# Patient Record
Sex: Female | Born: 1985
Health system: Southern US, Community
[De-identification: ages and names within clinical notes are randomized; demographics above are authoritative.]

## PROBLEM LIST (undated history)

## (undated) DIAGNOSIS — I824Y9 Acute embolism and thrombosis of unspecified deep veins of unspecified proximal lower extremity: Secondary | ICD-10-CM

## (undated) DIAGNOSIS — IMO0002 Reserved for concepts with insufficient information to code with codable children: Secondary | ICD-10-CM

## (undated) DIAGNOSIS — K219 Gastro-esophageal reflux disease without esophagitis: Secondary | ICD-10-CM

## (undated) HISTORY — DX: Gastro-esophageal reflux disease without esophagitis: K21.9

## (undated) HISTORY — DX: Reserved for concepts with insufficient information to code with codable children: IMO0002

## (undated) HISTORY — PX: OTHER SURGICAL HISTORY: SHX169

---

## 2006-04-15 ENCOUNTER — Emergency Department (HOSPITAL_COMMUNITY): Admission: EM | Admit: 2006-04-15 | Discharge: 2006-04-15 | Payer: Self-pay | Admitting: Emergency Medicine

## 2006-11-18 ENCOUNTER — Emergency Department (HOSPITAL_COMMUNITY): Admission: EM | Admit: 2006-11-18 | Discharge: 2006-11-18 | Payer: Self-pay | Admitting: Emergency Medicine

## 2007-07-12 ENCOUNTER — Emergency Department (HOSPITAL_COMMUNITY): Admission: EM | Admit: 2007-07-12 | Discharge: 2007-07-12 | Payer: Self-pay | Admitting: Emergency Medicine

## 2008-11-04 ENCOUNTER — Emergency Department (HOSPITAL_COMMUNITY): Admission: EM | Admit: 2008-11-04 | Discharge: 2008-11-04 | Payer: Self-pay | Admitting: Emergency Medicine

## 2009-05-03 ENCOUNTER — Emergency Department (HOSPITAL_COMMUNITY): Admission: EM | Admit: 2009-05-03 | Discharge: 2009-05-03 | Payer: Self-pay | Admitting: Emergency Medicine

## 2009-05-08 ENCOUNTER — Emergency Department (HOSPITAL_COMMUNITY): Admission: EM | Admit: 2009-05-08 | Discharge: 2009-05-08 | Payer: Self-pay | Admitting: Emergency Medicine

## 2009-06-19 ENCOUNTER — Inpatient Hospital Stay (HOSPITAL_COMMUNITY): Admission: AD | Admit: 2009-06-19 | Discharge: 2009-06-19 | Payer: Self-pay | Admitting: Obstetrics & Gynecology

## 2009-07-18 ENCOUNTER — Ambulatory Visit: Payer: Self-pay | Admitting: Obstetrics & Gynecology

## 2009-07-18 ENCOUNTER — Encounter: Payer: Self-pay | Admitting: Obstetrics & Gynecology

## 2009-08-22 ENCOUNTER — Emergency Department (HOSPITAL_COMMUNITY): Admission: EM | Admit: 2009-08-22 | Discharge: 2009-08-22 | Payer: Self-pay | Admitting: Emergency Medicine

## 2009-09-17 ENCOUNTER — Ambulatory Visit: Payer: Self-pay | Admitting: Obstetrics & Gynecology

## 2009-09-17 ENCOUNTER — Ambulatory Visit (HOSPITAL_COMMUNITY): Admission: RE | Admit: 2009-09-17 | Discharge: 2009-09-17 | Payer: Self-pay | Admitting: Obstetrics & Gynecology

## 2009-09-17 ENCOUNTER — Encounter: Payer: Self-pay | Admitting: Obstetrics & Gynecology

## 2009-10-29 ENCOUNTER — Emergency Department (HOSPITAL_COMMUNITY): Admission: EM | Admit: 2009-10-29 | Discharge: 2009-10-29 | Payer: Self-pay | Admitting: Emergency Medicine

## 2009-11-01 ENCOUNTER — Ambulatory Visit: Payer: Self-pay | Admitting: Obstetrics and Gynecology

## 2010-01-17 ENCOUNTER — Ambulatory Visit: Payer: Self-pay | Admitting: Obstetrics & Gynecology

## 2010-03-19 ENCOUNTER — Emergency Department (HOSPITAL_COMMUNITY): Admission: EM | Admit: 2010-03-19 | Discharge: 2010-03-19 | Payer: Self-pay | Admitting: Emergency Medicine

## 2010-04-04 ENCOUNTER — Ambulatory Visit: Payer: Self-pay | Admitting: Obstetrics and Gynecology

## 2010-07-03 ENCOUNTER — Ambulatory Visit: Payer: Self-pay | Admitting: Obstetrics and Gynecology

## 2010-07-09 IMAGING — US US TRANSVAGINAL NON-OB
1 series · 14 of 25 positions shown · non-contrast
Comparison: May 08, 2009

CLINICAL DATA: Pelvic pain; cystic ovarian masses

TRANSVAGINAL ULTRASOUND OF PELVIS
TECHNIQUE: Transvaginal ultrasound examination of the pelvis was
performed including evaluation of the uterus, ovaries, adnexal
regions, and pelvic cul-de-sac.

[Series 1: us transvaginal non-ob · 0.12mm/px · 14 of 35 slices shown]
[im 1/35]
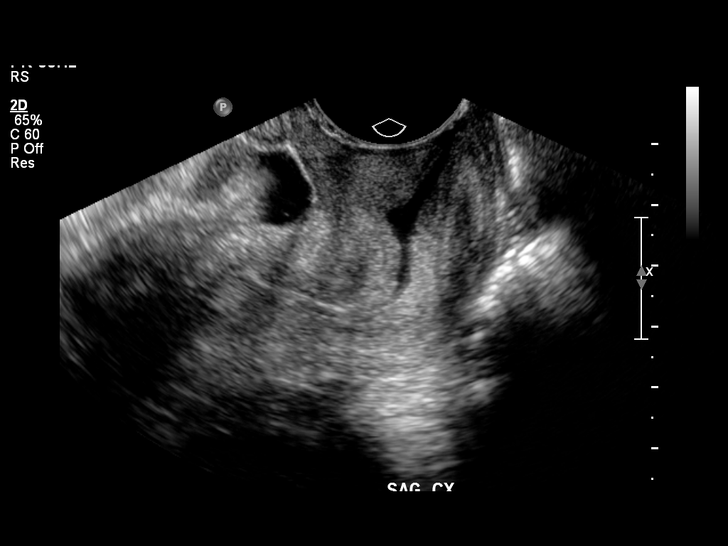
[im 3/35]
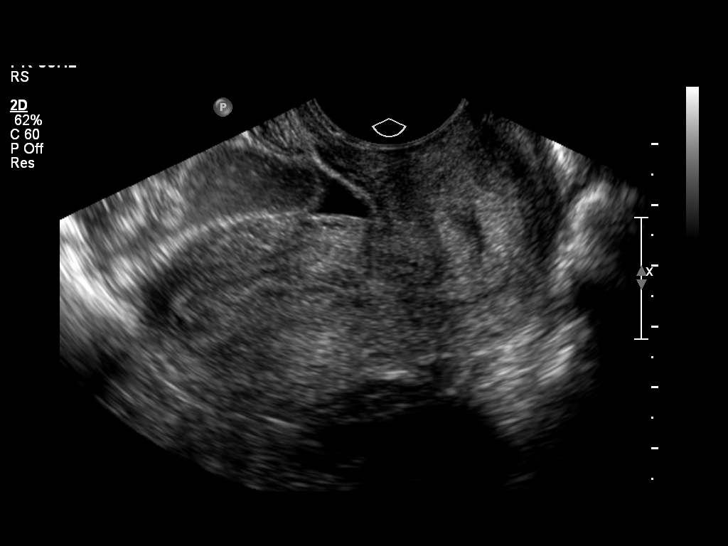
[im 6/35]
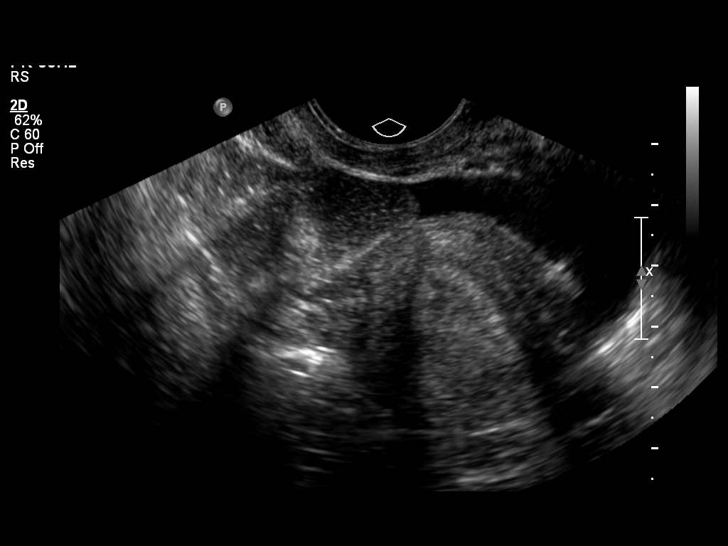
[im 9/35]
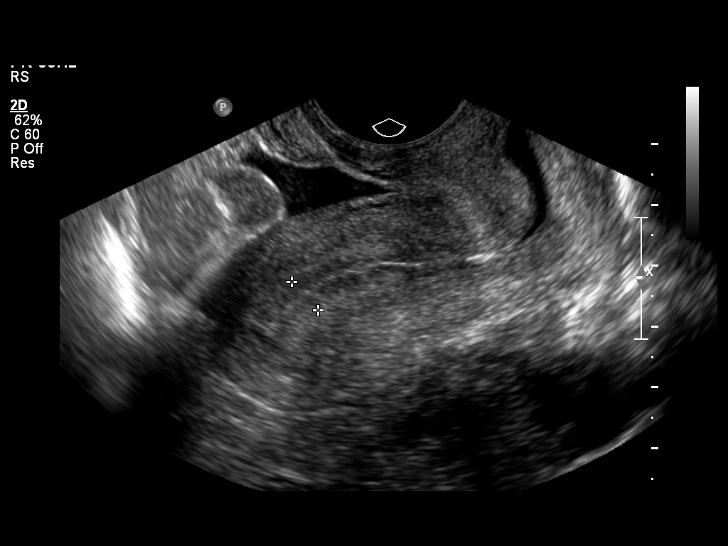
[im 12/35]
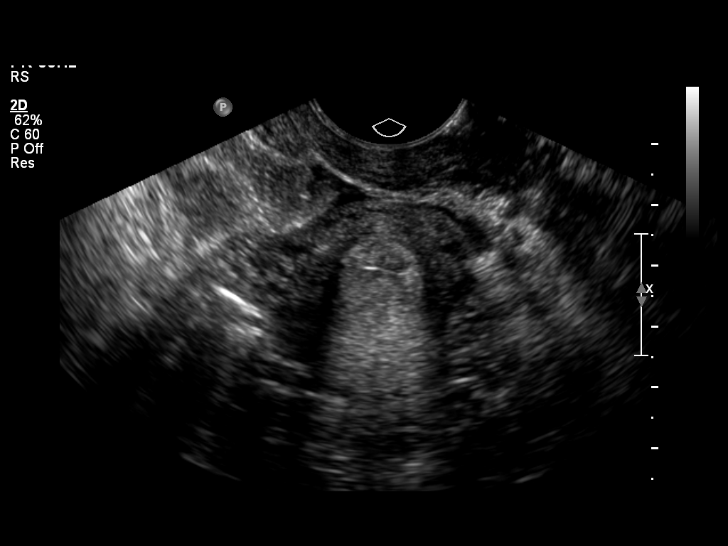
[im 13/35]
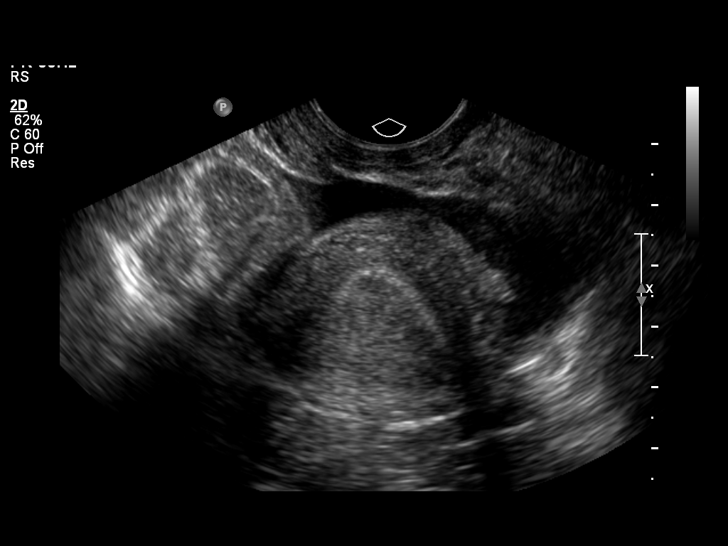
[im 16/35]
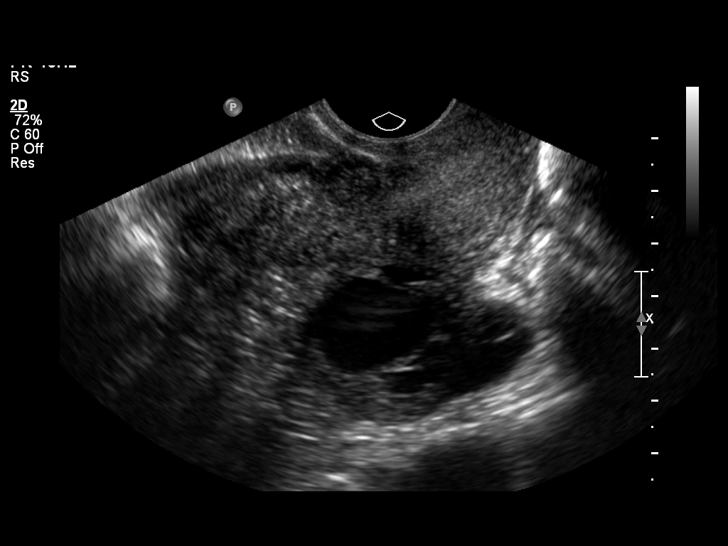
[im 19/35]
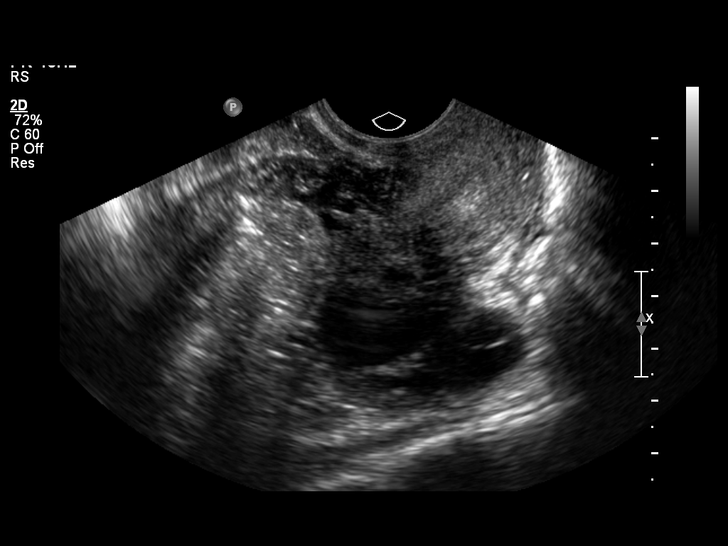
[im 22/35]
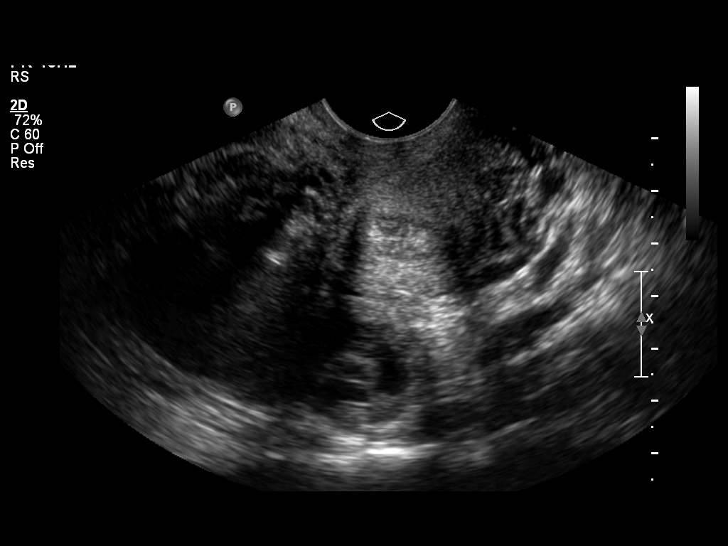
[im 23/35]
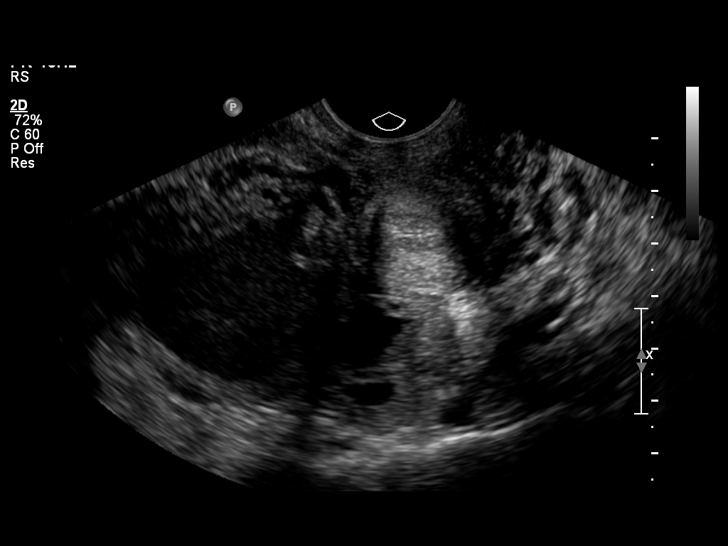
[im 26/35]
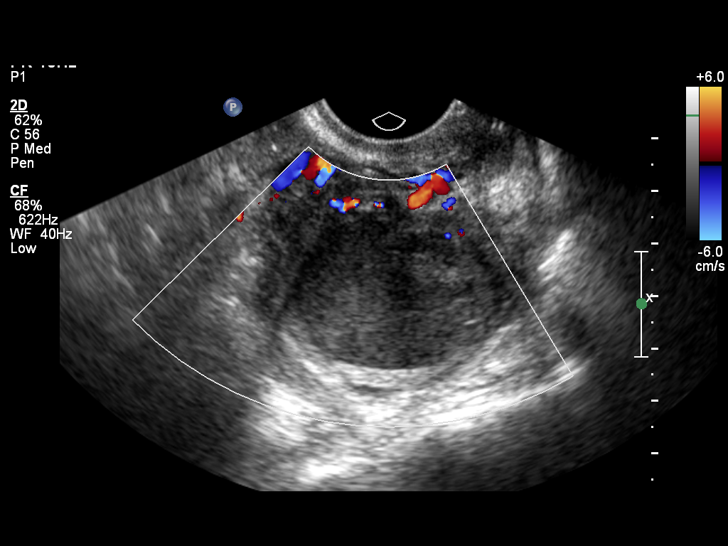
[im 29/35]
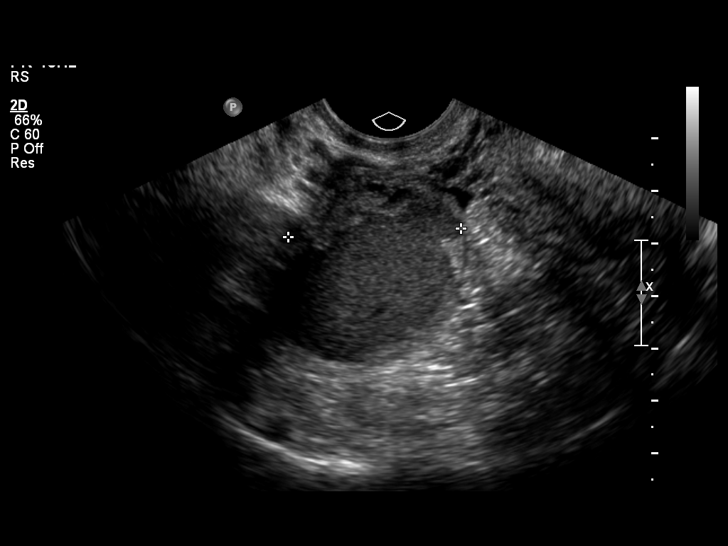
[im 32/35]
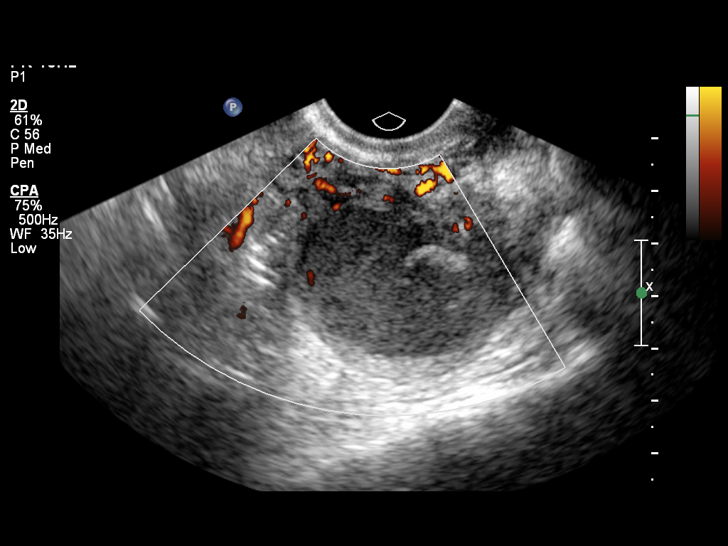
[im 35/35]
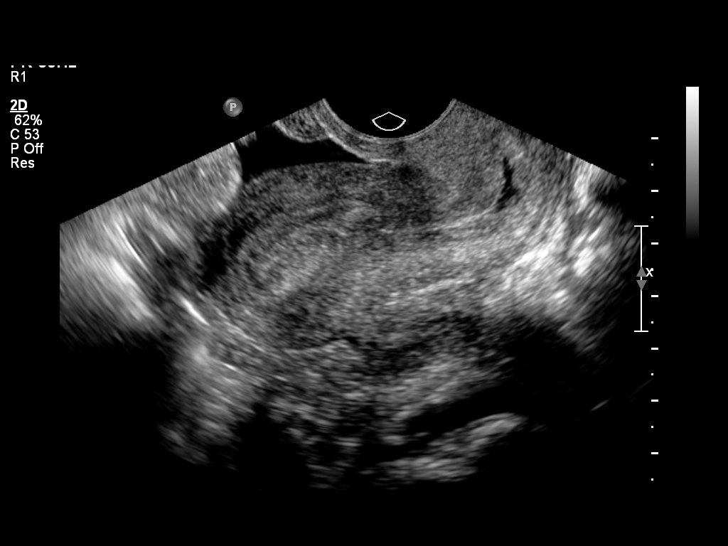

[14 of 25 positions shown; findings below may reference images not displayed]

FINDINGS: The uterus has a normal size and echotexture.
Endometrial stripe is thin and homogeneous, measuring 6 mm in
width.

Today, the left ovary measures 4.3 x 2.5 x 2.2 cm.  The complex
left ovarian cyst has resolved.  The right ovary measures 4.8 x
x 3.3 cm and contains a stable 3.7 cm homogeneous mass hyperechoic
foci, consistent with an endometrioma or a dermoid tumor.  There is
a small amount of free pelvic fluid.
IMPRESSION: Resolution of complex left ovarian cyst.

No change in right ovarian endometrioma versus dermoid tumor.  MRI
may be of help for better characterization if the lesion is not
excised.

## 2010-09-19 ENCOUNTER — Ambulatory Visit: Payer: Self-pay | Admitting: Obstetrics & Gynecology

## 2010-12-05 ENCOUNTER — Ambulatory Visit: Payer: Self-pay | Admitting: Obstetrics & Gynecology

## 2011-02-20 ENCOUNTER — Ambulatory Visit (INDEPENDENT_AMBULATORY_CARE_PROVIDER_SITE_OTHER): Payer: Medicaid Other

## 2011-02-20 DIAGNOSIS — N719 Inflammatory disease of uterus, unspecified: Secondary | ICD-10-CM

## 2011-03-13 ENCOUNTER — Ambulatory Visit (INDEPENDENT_AMBULATORY_CARE_PROVIDER_SITE_OTHER): Payer: Medicaid Other | Admitting: Obstetrics and Gynecology

## 2011-03-13 ENCOUNTER — Other Ambulatory Visit: Payer: Self-pay | Admitting: Obstetrics and Gynecology

## 2011-03-13 DIAGNOSIS — Z124 Encounter for screening for malignant neoplasm of cervix: Secondary | ICD-10-CM

## 2011-03-13 DIAGNOSIS — Z01419 Encounter for gynecological examination (general) (routine) without abnormal findings: Secondary | ICD-10-CM

## 2011-03-21 NOTE — Progress Notes (Unsigned)
Lori Mays, SAHM NO.:  [id number]  MEDICAL RECORD NO.:  [id number]           PATIENT TYPE:  A  LOCATION:  [facility]                   FACILITY:  [facility]  PHYSICIAN:  Argentina Donovan, MD        DATE OF BIRTH:  18-May-1986  DATE OF SERVICE:  03/13/2011                                 CLINIC NOTE  The patient is a 25 year old African American nulligravida female who had a left ovarian complex cyst removed in 2010 by Dr. Marice Potter for stage III endometriosis.  The patient has subsequently been on Depo-Provera for suppression which had worked pretty well for her older sister who is also taking the same drug.  She has been on it now slightly under 2 years, has had no significant problems with it, has been taking calcium with vitamin D supplementation.  On examination, her thyroid is symmetrical, no dominant masses.  The breasts are symmetrical.  No dominant masses.  No nipple discharge.  No supraclavicular or axillary nodes.  The abdomen is soft, flat, nontender.  No masses or organomegaly.  External genitalia is normal. BUN is within normal limits.  Vagina is clean and well rugated.  Cervix is clean and parous.  Pap smear was taken.  Uterus anterior, normal size, shape, consistency and the right ovary could not be palpated. The patient's blood pressure today was 114/70.  She is weighs 122 pounds and 5 feet 7 inches tall.  Has an allergy to BENADRYL which aggravated her asthma.  She takes no other medications on a regular basis.  IMPRESSION:  Normal gynecological examination, stage III endometriosis.          ______________________________ Argentina Donovan, MD    PR/MEDQ  D:  03/13/2011  T:  03/14/2011  Job:  [id number]

## 2011-04-04 LAB — CBC
HCT: 38 % (ref 36.0–46.0)
MCV: 85.4 fL (ref 78.0–100.0)
WBC: 5.1 10*3/uL (ref 4.0–10.5)

## 2011-04-05 LAB — CBC
HCT: 37.2 % (ref 36.0–46.0)
Hemoglobin: 12.2 g/dL (ref 12.0–15.0)
MCHC: 32.8 g/dL (ref 30.0–36.0)
Platelets: 274 10*3/uL (ref 150–400)
RBC: 4.35 MIL/uL (ref 3.87–5.11)

## 2011-04-07 LAB — POCT PREGNANCY, URINE: Preg Test, Ur: NEGATIVE

## 2011-04-07 LAB — CBC
HCT: 35.4 % — ABNORMAL LOW (ref 36.0–46.0)
Platelets: 263 10*3/uL (ref 150–400)
WBC: 3.8 10*3/uL — ABNORMAL LOW (ref 4.0–10.5)

## 2011-04-07 LAB — DIFFERENTIAL
Basophils Relative: 0 % (ref 0–1)
Eosinophils Absolute: 0.1 10*3/uL (ref 0.0–0.7)
Eosinophils Relative: 2 % (ref 0–5)
Lymphocytes Relative: 35 % (ref 12–46)
Neutro Abs: 2 10*3/uL (ref 1.7–7.7)

## 2011-04-07 LAB — GC/CHLAMYDIA PROBE AMP, GENITAL: Chlamydia, DNA Probe: NEGATIVE

## 2011-04-07 LAB — URINALYSIS, ROUTINE W REFLEX MICROSCOPIC
Bilirubin Urine: NEGATIVE
Ketones, ur: NEGATIVE mg/dL
Protein, ur: NEGATIVE mg/dL
Specific Gravity, Urine: 1.01 (ref 1.005–1.030)
Urobilinogen, UA: 1 mg/dL (ref 0.0–1.0)
pH: 6.5 (ref 5.0–8.0)

## 2011-04-08 LAB — URINE MICROSCOPIC-ADD ON

## 2011-04-08 LAB — COMPREHENSIVE METABOLIC PANEL
ALT: 15 U/L (ref 0–35)
AST: 20 U/L (ref 0–37)
Albumin: 4.1 g/dL (ref 3.5–5.2)
Alkaline Phosphatase: 55 U/L (ref 39–117)
Calcium: 9.5 mg/dL (ref 8.4–10.5)
Creatinine, Ser: 0.79 mg/dL (ref 0.4–1.2)
GFR calc Af Amer: >60 mL/min (ref >60)
Potassium: 3.7 mEq/L (ref 3.5–5.1)
Sodium: 140 mEq/L (ref 135–145)
Total Bilirubin: 0.6 mg/dL (ref 0.3–1.2)
Total Protein: 7.1 g/dL (ref 6.0–8.3)

## 2011-04-08 LAB — URINALYSIS, ROUTINE W REFLEX MICROSCOPIC
Bilirubin Urine: NEGATIVE
Nitrite: NEGATIVE
Protein, ur: NEGATIVE mg/dL
Urobilinogen, UA: 1 mg/dL (ref 0.0–1.0)
pH: 6 (ref 5.0–8.0)

## 2011-04-08 LAB — DIFFERENTIAL
Basophils Absolute: 0 10*3/uL (ref 0.0–0.1)
Eosinophils Absolute: 0 10*3/uL (ref 0.0–0.7)
Eosinophils Relative: 0 % (ref 0–5)
Monocytes Absolute: 0.5 10*3/uL (ref 0.1–1.0)
Monocytes Relative: 5 % (ref 3–12)

## 2011-04-08 LAB — WET PREP, GENITAL

## 2011-04-08 LAB — CBC: HCT: 39.3 % (ref 36.0–46.0)

## 2011-04-08 LAB — GC/CHLAMYDIA PROBE AMP, GENITAL: Chlamydia, DNA Probe: NEGATIVE

## 2011-05-13 NOTE — Group Therapy Note (Signed)
NAMELENNA, HAGARTY NO.:  [id number]   MEDICAL RECORD NO.:  [id number]          PATIENT TYPE:  WOC   LOCATION:  [facility]                   FACILITY:  [facility]   PHYSICIAN:  [name], MD        DATE OF BIRTH:  [birth year]   DATE OF SERVICE:  07/18/2009                                  CLINIC NOTE   Lori Mays is a 25 year old single white gravida 0 who is seen here as a  followup from her 2 MAU visits.  She was initially seen in the Maternity  Admission Unit on May 08, 2009, complaining of abdominal pain.  It  started the first time after she has had intercourse.  At that time, she  was rating it 4/10, it was not relieved with ibuprofen.  Her GC and  chlamydia cultures were negative and ultrasound was done which showed  bilateral complex adnexal masses.  She then was seen in the MAU on June 19, 2009.  At that point, another ultrasound was done and the left  ovarian complex cyst had resolved, the right one remained unchanged at  3.7 cm with characteristics that I inclined the radiologist to believe  that this is of either an endometrioma versus a dermoid.  She was  counseled at that time about surgical removal.  She comes here to the  GYN Clinic understanding the risks of surgery and wishing to proceed.   PAST MEDICAL HISTORY:  Asthma.  She has not any recent admissions for  this condition.   PAST SURGICAL HISTORY:  None.   SOCIAL HISTORY:  Negative, except she drinks alcohol occasionally.  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   ALLERGIES:  No latex allergies.  Her only known drug allergy is BENADRYL  and she says that when she took Benadryl, she felt that her asthma  became worse.   FAMILY HISTORY:  Positive for endometriosis in her sister, breast cancer  in her maternal grandmother.  She denies family history of GYN or colon  malignancies.   REVIEW OF SYSTEMS:  She says she has not had a Pap smear more than a  year.  She reports  occasional episodes of dyspareunia.  She has a  monogamous partner that is not necessarily boyfriend, more in terms of a  friend.  She uses condoms faithfully.   PHYSICAL EXAMINATION:  VITAL SIGNS:  Height 5 feet 7 inches, weight 116,  blood pressure 112/60, pulse 59.  HEENT:  Normal.  HEART:  Regular rate rhythm.  LUNGS:  Clear to auscultation bilaterally.  ABDOMEN:  Scaphoid benign.  No palpable hepatosplenomegaly.  PELVIC:  External genitalia shaved, no lesions.  Cervix, nulliparous.  Normal discharge.  Uterus is retroverted, normal size, minimal mobility,  but not particularly tender.  Her adnexa on the left is not palpable.  On the right, it reveals to be about 4 cm.   ASSESSMENT AND PLAN:  For annual exam, I have checked a Pap smear and  recommended self-breast, self-vulvar exam with regard to her probable  endometrioma versus dermoid.  We will plan a laparoscopic excision of  that ovarian cyst on the right.  Paperwork will be turned in today.      [name], MD     MCD/MEDQ  D:  07/18/2009  T:  07/19/2009  Job:  [id number]

## 2011-05-14 ENCOUNTER — Ambulatory Visit (INDEPENDENT_AMBULATORY_CARE_PROVIDER_SITE_OTHER): Payer: Medicaid Other

## 2011-05-14 DIAGNOSIS — Z3049 Encounter for surveillance of other contraceptives: Secondary | ICD-10-CM

## 2011-07-30 ENCOUNTER — Ambulatory Visit: Payer: Medicaid Other

## 2011-08-01 ENCOUNTER — Ambulatory Visit (INDEPENDENT_AMBULATORY_CARE_PROVIDER_SITE_OTHER): Payer: Medicaid Other

## 2011-08-01 VITALS — BP 122/77 | HR 63

## 2011-08-01 DIAGNOSIS — N949 Unspecified condition associated with female genital organs and menstrual cycle: Secondary | ICD-10-CM

## 2011-08-01 DIAGNOSIS — N938 Other specified abnormal uterine and vaginal bleeding: Secondary | ICD-10-CM

## 2011-08-01 MED ORDER — MEDROXYPROGESTERONE ACETATE 150 MG/ML IM SUSP
150.0000 mg | INTRAMUSCULAR | Status: DC
  Administered 2011-08-01 – 2016-07-09 (×7): 150 mg via INTRAMUSCULAR

## 2011-10-14 LAB — URINALYSIS, ROUTINE W REFLEX MICROSCOPIC
Glucose, UA: NEGATIVE
Ketones, ur: NEGATIVE
Specific Gravity, Urine: 1.011
pH: 7.5

## 2011-10-14 LAB — POCT PREGNANCY, URINE
Operator id: 108131
Preg Test, Ur: NEGATIVE

## 2011-10-20 ENCOUNTER — Ambulatory Visit (INDEPENDENT_AMBULATORY_CARE_PROVIDER_SITE_OTHER): Payer: Self-pay | Admitting: *Deleted

## 2011-10-20 DIAGNOSIS — Z3042 Encounter for surveillance of injectable contraceptive: Secondary | ICD-10-CM

## 2011-10-20 DIAGNOSIS — N949 Unspecified condition associated with female genital organs and menstrual cycle: Secondary | ICD-10-CM

## 2011-10-20 DIAGNOSIS — Z3049 Encounter for surveillance of other contraceptives: Secondary | ICD-10-CM

## 2011-10-20 DIAGNOSIS — N939 Abnormal uterine and vaginal bleeding, unspecified: Secondary | ICD-10-CM

## 2012-01-05 ENCOUNTER — Ambulatory Visit (INDEPENDENT_AMBULATORY_CARE_PROVIDER_SITE_OTHER): Payer: Self-pay

## 2012-01-05 VITALS — BP 118/74 | HR 62

## 2012-01-05 DIAGNOSIS — N938 Other specified abnormal uterine and vaginal bleeding: Secondary | ICD-10-CM

## 2012-01-05 DIAGNOSIS — Z3049 Encounter for surveillance of other contraceptives: Secondary | ICD-10-CM

## 2012-01-05 DIAGNOSIS — N949 Unspecified condition associated with female genital organs and menstrual cycle: Secondary | ICD-10-CM

## 2012-01-05 MED ORDER — MEDROXYPROGESTERONE ACETATE 150 MG/ML IM SUSP
150.0000 mg | Freq: Once | INTRAMUSCULAR | Status: AC
  Administered 2012-01-05: 150 mg via INTRAMUSCULAR

## 2012-01-07 ENCOUNTER — Encounter (HOSPITAL_COMMUNITY): Payer: Self-pay | Admitting: Emergency Medicine

## 2012-01-07 ENCOUNTER — Emergency Department (HOSPITAL_COMMUNITY)
Admission: EM | Admit: 2012-01-07 | Discharge: 2012-01-07 | Disposition: A | Discharge status: Home / Self Care | Payer: Self-pay | Attending: Emergency Medicine | Admitting: Emergency Medicine

## 2012-01-07 DIAGNOSIS — J45909 Unspecified asthma, uncomplicated: Secondary | ICD-10-CM | POA: Insufficient documentation

## 2012-01-07 DIAGNOSIS — Z760 Encounter for issue of repeat prescription: Secondary | ICD-10-CM | POA: Insufficient documentation

## 2012-01-07 MED ORDER — ALBUTEROL SULFATE HFA 108 (90 BASE) MCG/ACT IN AERS: 1.0000 | INHALATION_SPRAY | Freq: Four times a day (QID) | RESPIRATORY_TRACT | Status: DC | PRN

## 2012-01-07 NOTE — ED Provider Notes (Signed)
History     CSN: [id number]  Arrival date & time 01/07/12  1642   First MD Initiated Contact with Patient 01/07/12 2003      Chief Complaint  Patient presents with  . Medication Refill    (Consider location/radiation/quality/duration/timing/severity/associated sxs/prior treatment) HPI Comments: Patient says she has a history of asthma with infrequent attacks.  He is the last of her inhaler yesterday.  She received that inhaler in the emergency department last year.  She has not established with a local primary care physician.  She is requesting a prescription for an inhaler  The history is provided by the patient.    Past Medical History  Diagnosis Date  . Asthma   . Endometriosis     Past Surgical History  Procedure Date  . Laproscopy 2 years ago    No family history on file.  History  Substance Use Topics  . Smoking status: Never Smoker   . Smokeless tobacco: Never Used  . Alcohol Use: 0.6 - 1.2 oz/week    1-2 Glasses of wine per week    OB History    Grav Para Term Preterm Abortions TAB SAB Ect Mult Living   0 0 0 0 0 0 0 0 0 0       Review of Systems  Constitutional: Negative for fatigue.  Respiratory: Negative for cough, shortness of breath and wheezing.   Cardiovascular: Negative.   Neurological: Negative for dizziness.    Allergies  Benadryl allergy  Home Medications   Current Outpatient Rx  Name Route Sig Dispense Refill  . ALBUTEROL SULFATE HFA 108 (90 BASE) MCG/ACT IN AERS Inhalation Inhale 2 puffs into the lungs every 6 (six) hours as needed. For asthma    . CALCIUM CARBONATE 600 MG PO TABS Oral Take 600 mg by mouth 2 (two) times daily.      Marland Kitchen NAPROXEN 500 MG PO TABS Oral Take 500 mg by mouth 3 (three) times daily with meals. For pain    . ALBUTEROL SULFATE HFA 108 (90 BASE) MCG/ACT IN AERS Inhalation Inhale 1-2 puffs into the lungs every 6 (six) hours as needed for wheezing. 1 Inhaler 0    BP 117/66  Pulse 50  Temp(Src) 98.6 F (37 C)  (Oral)  Resp 18  SpO2 100%  Physical Exam  Constitutional: She is oriented to person, place, and time. She appears well-developed.  HENT:  Head: Normocephalic.  Eyes: Pupils are equal, round, and reactive to light.  Cardiovascular: Normal rate.   Pulmonary/Chest: Effort normal and breath sounds normal. She has no wheezes.  Neurological: She is alert and oriented to person, place, and time.  Skin: Skin is warm and dry.    ED Course  Procedures (including critical care time)  Labs Reviewed - No data to display No results found.   1. Medication refill   2. Asthma       MDM  Requesting albuterol inhaler, refill.  Also referred to primary care physician        Arman Filter, NP 01/07/12 2021  Arman Filter, NP 01/07/12 2024

## 2012-01-07 NOTE — ED Notes (Signed)
Pt st's she just need a inhaler.  St's she just the last of hers that she received here.

## 2012-01-08 NOTE — ED Provider Notes (Signed)
Medical screening examination/treatment/procedure(s) were performed by non-physician practitioner and as supervising physician I was immediately available for consultation/collaboration.  Flint Melter, MD 01/08/12 1655

## 2012-03-22 ENCOUNTER — Ambulatory Visit (INDEPENDENT_AMBULATORY_CARE_PROVIDER_SITE_OTHER): Payer: Self-pay | Admitting: *Deleted

## 2012-03-22 VITALS — BP 114/73 | HR 72

## 2012-03-22 DIAGNOSIS — Z3049 Encounter for surveillance of other contraceptives: Secondary | ICD-10-CM

## 2012-03-22 DIAGNOSIS — N809 Endometriosis, unspecified: Secondary | ICD-10-CM

## 2012-03-22 DIAGNOSIS — N926 Irregular menstruation, unspecified: Secondary | ICD-10-CM

## 2012-03-22 NOTE — Progress Notes (Signed)
Informed patient next visit needs annual exam and depoprovera to continue getting depoprovera

## 2012-06-07 ENCOUNTER — Encounter: Payer: Self-pay | Admitting: Physician Assistant

## 2012-06-07 ENCOUNTER — Ambulatory Visit (INDEPENDENT_AMBULATORY_CARE_PROVIDER_SITE_OTHER): Payer: Self-pay | Admitting: Physician Assistant

## 2012-06-07 VITALS — BP 113/71 | HR 68 | Temp 99.5°F | Ht 69.0 in | Wt 133.2 lb

## 2012-06-07 DIAGNOSIS — IMO0002 Reserved for concepts with insufficient information to code with codable children: Secondary | ICD-10-CM | POA: Insufficient documentation

## 2012-06-07 DIAGNOSIS — Z3049 Encounter for surveillance of other contraceptives: Secondary | ICD-10-CM

## 2012-06-07 DIAGNOSIS — M7989 Other specified soft tissue disorders: Secondary | ICD-10-CM

## 2012-06-07 HISTORY — DX: Reserved for concepts with insufficient information to code with codable children: IMO0002

## 2012-06-07 MED ORDER — MEDROXYPROGESTERONE ACETATE 150 MG/ML IM SUSP
150.0000 mg | Freq: Once | INTRAMUSCULAR | Status: AC
  Administered 2012-06-07: 150 mg via INTRAMUSCULAR

## 2012-06-07 NOTE — Progress Notes (Signed)
Chief Complaint:  Cyst on left forearm Routine Depo Provera Injection  Lori Mays is  26 y.o. G0P0000.  No LMP recorded. Patient has had an injection..  She presents complaining of Cyst on left forearm  Pt reports "cyst" on left forearm x 1 month. Was evaluated at the [facility] clinic and given ABX and instructed to FU as needed if it did not resolve. States now change, forearm "aches like muscle strain".   Obstetrical/Gynecological History: Pertinent Gynecological History: Menses: Depo Bleeding: Depo Contraception: Depo-Provera injections Last pap: normal Date: 2012   Past Medical History: Past Medical History  Diagnosis Date  . Asthma   . Endometriosis   . Cyst 06/07/2012    Past Surgical History: Past Surgical History  Procedure Date  . Laproscopy 2 years ago    Family History: No family history on file.  Social History: History  Substance Use Topics  . Smoking status: Never Smoker   . Smokeless tobacco: Never Used  . Alcohol Use: 0.6 - 1.2 oz/week    1-2 Glasses of wine per week    Allergies:  Allergies  Allergen Reactions  . Diphenhydramine Hcl     Asthma attacks    Review of Systems - Negative except what has been reviewed in HPI  Physical Exam   Blood pressure 113/71, pulse 68, temperature 99.5 F (37.5 C), temperature source Oral, height 5\' 9"  (1.753 m), weight 133 lb 3.2 oz (60.419 kg).  General: General appearance - alert, well appearing, and in no distress, oriented to person, place, and time and normal appearing weight Mental status - alert, oriented to person, place, and time, normal mood, behavior, speech, dress, motor activity, and thought processes, affect appropriate to mood Extremities - left forearm with probable inclusion cyst  Informed consent obtain and sign. Appropriate timeout performed. Area cleansed with betadine and infused with 1% lidocaine. After appropriate block reached, 22 gauge needle was introduced into cyst and no  fluid was asparated Focused Gynecological Exam: examination not indicated  Labs: No results found for this or any previous visit (from the past 24 hour(s)). Imaging Studies:  No results found.   Assessment: Patient Active Problem List  Diagnoses  . Cyst    Plan: Follow up with PCP for further intervention  Novella Abraha E. 06/07/2012,4:25 PM

## 2012-06-07 NOTE — Progress Notes (Deleted)
Chief Complaint:  Cystitis   Lori Mays is  26 y.o. G0P0000.  No LMP recorded. Patient has had an injection..  Her pregnancy status is {Neg/pos/unk:12251}.  She presents complaining of Cystitis . Onset is described as {Desc; symptom onset:2000} and has been present for  *** {TIME UNITS:20210}. ***  Obstetrical/Gynecological History: {GYN/OB ZO:1096045}  Past Medical History: Past Medical History  Diagnosis Date  . Asthma   . Endometriosis   . Cyst 06/07/2012    Past Surgical History: Past Surgical History  Procedure Date  . Laproscopy 2 years ago    Family History: No family history on file.  Social History: History  Substance Use Topics  . Smoking status: Never Smoker   . Smokeless tobacco: Never Used  . Alcohol Use: 0.6 - 1.2 oz/week    1-2 Glasses of wine per week    Allergies:  Allergies  Allergen Reactions  . Diphenhydramine Hcl     Asthma attacks     (Not in a hospital admission)  Review of Systems - {ros master:310782}  Physical Exam   Blood pressure 113/71, pulse 68, temperature 99.5 F (37.5 C), temperature source Oral, height 5\' 9"  (1.753 m), weight 133 lb 3.2 oz (60.419 kg).  General: {female adult master:310786} Focused Gynecological Exam: {pelvic exam:315900::"normal external genitalia, vulva, vagina, cervix, uterus and adnexa"}  Labs: No results found for this or any previous visit (from the past 24 hour(s)). Imaging Studies:  No results found.   Assessment: Patient Active Problem List  Diagnoses  . Cyst    Plan: ***  Naydeline Morace E. 06/07/2012,4:15 PM

## 2012-06-07 NOTE — Patient Instructions (Signed)
Contraception Choices Birth control (contraception) can stop pregnancy from happening. Different types of birth control work in different ways. Some can:  Make the mucus in the cervix thick. This makes it hard for sperm to get into the uterus.   Thin the lining of the uterus. This makes it hard for an egg to attach to the wall of the uterus.   Stop the ovaries from releasing an egg.   Block the sperm from reaching the egg.  Certain types of surgery can stop pregnancy from happening. For women, the sugery closes the fallopian tubes (tubal ligation). For men, the surgery stops sperm from releasing during sex (vasectomy). HORMONAL BIRTH CONTROL Hormonal birth control stops pregnancy by putting hormones into your body. Types of birth control include:  A small tube put under the skin of the upper arm (implant). The tube can stay in place for 3 years.   Shots given every 3 months.   Pills taken every day or once after sex (intercourse).   Patches that are changed once a week.   A ring put into the vagina (vaginal ring). The ring is left in place for 3 weeks and removed for 1 week. Then, a new ring is put in the vagina.  BARRIER BIRTH CONTROL  Barrier birth control blocks sperm from reaching the egg. Types of birth control include:   A thin covering worn on the penis (female condom) during sex.   A soft, loose covering put into the vagina (female condom) before sex.   A rubber bowl that sits over the cervix (diaphragm). The bowl must be made for you. The bowl is put into the vagina before sex. The bowl is left in place for 6 to 8 hours after sex.   A small, soft cup that fits over the cervix (cervical cap). The cup must be made for you. The cup can be left in place for 48 hours after sex.   A sponge that is put into the vagina before sex.   A chemical that kills or blocks sperm from getting into the cervix and uterus (spermicide). The chemical may be a cream, jelly, foam, or pill.    INTRAUTERINE (IUD) BIRTH CONTROL  IUD birth control is a small, T-shaped piece of plastic. The plastic is put inside the uterus. There are 2 types of IUD:  Copper IUD. The IUD is covered in copper wire. The copper makes a fluid that kills sperm. It can stay in place for 10 years.   Hormone IUD. The hormone stops pregnancy from happening. It can stay in place for 5 years.  NATURAL FAMILY PLANNING BIRTH CONTROL  Natural family planning means not having sex or using barrier birth control when the woman is fertile. A woman can:  Use a calendar to keep track of when she is fertile.   Use a thermometer to measure her body temperature.  Protect yourself against sexual diseases no matter what type of birth control you use. Talk to your doctor about which type of birth control is best for you. Document Released: 10/12/2009 Document Revised: 12/04/2011 Document Reviewed: 04/23/2011 [employer] Patient Information 2012 [employer]. 

## 2012-08-23 ENCOUNTER — Ambulatory Visit (INDEPENDENT_AMBULATORY_CARE_PROVIDER_SITE_OTHER): Payer: Self-pay

## 2012-08-23 VITALS — BP 134/85 | HR 80 | Wt 135.0 lb

## 2012-08-23 DIAGNOSIS — Z3049 Encounter for surveillance of other contraceptives: Secondary | ICD-10-CM

## 2012-08-23 MED ORDER — MEDROXYPROGESTERONE ACETATE 150 MG/ML IM SUSP
150.0000 mg | Freq: Once | INTRAMUSCULAR | Status: AC
  Administered 2012-08-23: 150 mg via INTRAMUSCULAR

## 2012-11-08 ENCOUNTER — Ambulatory Visit (INDEPENDENT_AMBULATORY_CARE_PROVIDER_SITE_OTHER): Payer: Self-pay | Admitting: General Practice

## 2012-11-08 VITALS — BP 108/71 | HR 66 | Temp 99.3°F | Ht 69.0 in | Wt 136.6 lb

## 2012-11-08 DIAGNOSIS — Z3049 Encounter for surveillance of other contraceptives: Secondary | ICD-10-CM

## 2012-11-08 MED ORDER — MEDROXYPROGESTERONE ACETATE 150 MG/ML IM SUSP
150.0000 mg | Freq: Once | INTRAMUSCULAR | Status: AC
  Administered 2012-11-08: 150 mg via INTRAMUSCULAR

## 2013-01-24 ENCOUNTER — Ambulatory Visit (INDEPENDENT_AMBULATORY_CARE_PROVIDER_SITE_OTHER): Payer: Self-pay

## 2013-01-24 VITALS — BP 123/78 | HR 66 | Temp 98.3°F | Ht 69.0 in | Wt 132.6 lb

## 2013-01-24 DIAGNOSIS — Z3049 Encounter for surveillance of other contraceptives: Secondary | ICD-10-CM

## 2013-01-24 DIAGNOSIS — IMO0001 Reserved for inherently not codable concepts without codable children: Secondary | ICD-10-CM

## 2013-01-24 MED ORDER — MEDROXYPROGESTERONE ACETATE 150 MG/ML IM SUSP
150.0000 mg | Freq: Once | INTRAMUSCULAR | Status: AC
  Administered 2013-01-24: 150 mg via INTRAMUSCULAR

## 2013-04-11 ENCOUNTER — Ambulatory Visit: Payer: Self-pay

## 2013-04-19 ENCOUNTER — Emergency Department (HOSPITAL_COMMUNITY): Payer: Self-pay

## 2013-04-19 ENCOUNTER — Encounter (HOSPITAL_COMMUNITY): Payer: Self-pay | Admitting: *Deleted

## 2013-04-19 ENCOUNTER — Emergency Department (HOSPITAL_COMMUNITY)
Admission: EM | Admit: 2013-04-19 | Discharge: 2013-04-19 | Disposition: A | Discharge status: Home / Self Care | Payer: Self-pay | Attending: Emergency Medicine | Admitting: Emergency Medicine

## 2013-04-19 DIAGNOSIS — S8990XA Unspecified injury of unspecified lower leg, initial encounter: Secondary | ICD-10-CM | POA: Insufficient documentation

## 2013-04-19 DIAGNOSIS — Y929 Unspecified place or not applicable: Secondary | ICD-10-CM | POA: Insufficient documentation

## 2013-04-19 DIAGNOSIS — X500XXA Overexertion from strenuous movement or load, initial encounter: Secondary | ICD-10-CM | POA: Insufficient documentation

## 2013-04-19 DIAGNOSIS — R229 Localized swelling, mass and lump, unspecified: Secondary | ICD-10-CM | POA: Insufficient documentation

## 2013-04-19 DIAGNOSIS — Y9389 Activity, other specified: Secondary | ICD-10-CM | POA: Insufficient documentation

## 2013-04-19 DIAGNOSIS — Z79899 Other long term (current) drug therapy: Secondary | ICD-10-CM | POA: Insufficient documentation

## 2013-04-19 DIAGNOSIS — Z8742 Personal history of other diseases of the female genital tract: Secondary | ICD-10-CM | POA: Insufficient documentation

## 2013-04-19 DIAGNOSIS — S99919A Unspecified injury of unspecified ankle, initial encounter: Secondary | ICD-10-CM | POA: Insufficient documentation

## 2013-04-19 DIAGNOSIS — J45909 Unspecified asthma, uncomplicated: Secondary | ICD-10-CM | POA: Insufficient documentation

## 2013-04-19 DIAGNOSIS — W010XXA Fall on same level from slipping, tripping and stumbling without subsequent striking against object, initial encounter: Secondary | ICD-10-CM | POA: Insufficient documentation

## 2013-04-19 DIAGNOSIS — M79674 Pain in right toe(s): Secondary | ICD-10-CM

## 2013-04-19 MED ORDER — HYDROCODONE-ACETAMINOPHEN 5-325 MG PO TABS: 2.0000 | ORAL_TABLET | ORAL | Status: DC | PRN

## 2013-04-19 MED ORDER — PROMETHAZINE HCL 25 MG PO TABS: 25.0000 mg | ORAL_TABLET | Freq: Four times a day (QID) | ORAL | Status: DC | PRN

## 2013-04-19 NOTE — ED Notes (Signed)
Pt reports she tripped yesterday and hurt right foot. Pain 8/10.

## 2013-04-19 NOTE — ED Provider Notes (Signed)
History    This chart was scribed for non-physician practitioner working with No att. providers found by Sofie Rower, ED Scribe. This patient was seen in room WTR7/WTR7 and the patient's care was started at 5:02PM.    CSN: [id number]  Arrival date & time 04/19/13  1515   None     Chief Complaint  Patient presents with  . Foot Injury    (Consider location/radiation/quality/duration/timing/severity/associated sxs/prior treatment) The history is provided by the patient. No language interpreter was used.    Lori Mays is a 27 y.o. female , with a hx of asthma, endometriosis, abdominal pain, cyst, and laproscopy, who presents to the Emergency Department complaining of sudden, moderate, foot injury, located at the right foot, onset yesterday (04/18/13).  Associated symptoms include swelling located at the right foot. The pt reports she jumped off the couch yesterday, where her foot became caught on a suitcase, causing her to land upon her right foot abnormally. Ever since the incident occurred, the pt informs her right foot has become increasingly swollen and painful, promtping her concern and desire to seek medical evaluation at [facility] this evening. The pt has taken tylenol which she informs, does not provide relief of the foot pain associated with the foot injury. Modifying factors include certain movements and positions, in addition to ambulation which intensifies the foot pain.  The pt denies fever, nausea, and vomiting.   The pt does not smoke, however, she does drink alcohol occasionally.   Pt does not have a PCP.    Past Medical History  Diagnosis Date  . Asthma   . Endometriosis   . Cyst 06/07/2012    Past Surgical History  Procedure Laterality Date  . Laproscopy  2 years ago    History reviewed. No pertinent family history.  History  Substance Use Topics  . Smoking status: Never Smoker   . Smokeless tobacco: Never Used  . Alcohol Use: .6 - 1.2 oz/week    1-2 Glasses  of wine per week    OB History   Grav Para Term Preterm Abortions TAB SAB Ect Mult Living   0 0 0 0 0 0 0 0 0 0       Review of Systems  Constitutional: Negative for fever.  Gastrointestinal: Negative for nausea and vomiting.  Musculoskeletal: Positive for arthralgias.  All other systems reviewed and are negative.    Allergies  Diphenhydramine hcl and Latex  Home Medications   Current Outpatient Rx  Name  Route  Sig  Dispense  Refill  . acetaminophen (TYLENOL) 500 MG tablet   Oral   Take 1,000 mg by mouth every 6 (six) hours as needed for pain.         Marland Kitchen albuterol (PROVENTIL HFA;VENTOLIN HFA) 108 (90 BASE) MCG/ACT inhaler   Inhalation   Inhale 2 puffs into the lungs every 6 (six) hours as needed. For asthma         . calcium carbonate (OS-CAL) 600 MG TABS   Oral   Take 600 mg by mouth 2 (two) times daily.             BP 118/75  Pulse 65  Temp(Src) 98.6 F (37 C) (Oral)  Resp 16  SpO2 97%  Physical Exam  Nursing note and vitals reviewed. Constitutional: She is oriented to person, place, and time. She appears well-developed and well-nourished. No distress.  HENT:  Head: Normocephalic and atraumatic.  Right Ear: External ear normal.  Left Ear: External ear normal.  Nose:  Nose normal.  Mouth/Throat: Oropharynx is clear and moist.  Eyes: Conjunctivae are normal.  Neck: Normal range of motion.  Cardiovascular: Normal rate, regular rhythm and normal heart sounds.  Exam reveals no gallop and no friction rub.   No murmur heard. Pulmonary/Chest: Effort normal and breath sounds normal. No stridor. No respiratory distress. She has no wheezes. She has no rales.  Abdominal: Soft. She exhibits no distension.  Musculoskeletal: Normal range of motion.       Right foot: She exhibits tenderness.  Tenderness to palpitation over the medial side of the right first toe and top of 4th toe. Neurovascularly intact.   Neurological: She is alert and oriented to person, place,  and time. She has normal strength.  Skin: Skin is warm and dry. She is not diaphoretic. No erythema.  Psychiatric: She has a normal mood and affect. Her behavior is normal.    ED Course  Procedures (including critical care time)  DIAGNOSTIC STUDIES: Oxygen Saturation is 97% on room air, normal by my interpretation.    COORDINATION OF CARE:   5:20 PM- Treatment plan concerning radiology results discussed with patient. Pt agrees with treatment.     Labs Reviewed - No data to display Dg Foot Complete Right  04/19/2013  *RADIOLOGY REPORT*  Clinical Data: Traumatic injury with pain  RIGHT FOOT COMPLETE - 3+ VIEW  Comparison: None.  Findings: No acute fracture or dislocation is noted.  No soft tissue abnormality is seen.  IMPRESSION: No acute abnormality noted.   Original Report Authenticated By: Alcide Clever, M.D.      1. Toe pain, right       MDM  Patient presents today with toe pain after tripping over a suitcase. X-ray negative for fracture. Neurovascularly intact. Discussed rest, ice, NSAIDs. She was given a postop shoe for comfort. Resource guide given to establish care with PCP. Return instructions given. Vital signs stable for discharge. Patient / Family / Caregiver informed of clinical course, understand medical decision-making process, and agree with plan.       I personally performed the services described in this documentation, which was scribed in my presence. The recorded information has been reviewed and is accurate.    [name], PA-C 04/19/13 1835

## 2013-04-19 NOTE — ED Notes (Signed)
Ortho tech in to place post-op shoe.

## 2013-04-20 ENCOUNTER — Ambulatory Visit (INDEPENDENT_AMBULATORY_CARE_PROVIDER_SITE_OTHER): Payer: Self-pay

## 2013-04-20 VITALS — BP 113/79 | HR 61 | Temp 97.7°F | Ht 69.5 in | Wt 134.0 lb

## 2013-04-20 DIAGNOSIS — Z3042 Encounter for surveillance of injectable contraceptive: Secondary | ICD-10-CM

## 2013-04-20 DIAGNOSIS — Z3049 Encounter for surveillance of other contraceptives: Secondary | ICD-10-CM

## 2013-04-20 MED ORDER — MEDROXYPROGESTERONE ACETATE 150 MG/ML IM SUSP
150.0000 mg | Freq: Once | INTRAMUSCULAR | Status: AC
  Administered 2013-04-20: 150 mg via INTRAMUSCULAR

## 2013-04-21 NOTE — ED Provider Notes (Signed)
Medical screening examination/treatment/procedure(s) were performed by non-physician practitioner and as supervising physician I was immediately available for consultation/collaboration.   Gwyneth Sprout, MD 04/21/13 2223

## 2013-06-13 ENCOUNTER — Encounter: Payer: Self-pay | Admitting: Family Medicine

## 2013-06-13 ENCOUNTER — Ambulatory Visit (INDEPENDENT_AMBULATORY_CARE_PROVIDER_SITE_OTHER): Payer: Self-pay | Admitting: Family Medicine

## 2013-06-13 VITALS — BP 105/67 | HR 72 | Ht 69.0 in | Wt 135.6 lb

## 2013-06-13 DIAGNOSIS — R609 Edema, unspecified: Secondary | ICD-10-CM

## 2013-06-13 DIAGNOSIS — M2550 Pain in unspecified joint: Secondary | ICD-10-CM | POA: Insufficient documentation

## 2013-06-13 DIAGNOSIS — N809 Endometriosis, unspecified: Secondary | ICD-10-CM | POA: Insufficient documentation

## 2013-06-13 DIAGNOSIS — R635 Abnormal weight gain: Secondary | ICD-10-CM | POA: Insufficient documentation

## 2013-06-13 DIAGNOSIS — Z01419 Encounter for gynecological examination (general) (routine) without abnormal findings: Secondary | ICD-10-CM

## 2013-06-13 DIAGNOSIS — R600 Localized edema: Secondary | ICD-10-CM

## 2013-06-13 DIAGNOSIS — Z124 Encounter for screening for malignant neoplasm of cervix: Secondary | ICD-10-CM

## 2013-06-13 LAB — RPR

## 2013-06-13 LAB — HIV ANTIBODY (ROUTINE TESTING W REFLEX): HIV: NONREACTIVE

## 2013-06-13 MED ORDER — NAPROXEN 500 MG PO TABS: 500.0000 mg | ORAL_TABLET | Freq: Two times a day (BID) | ORAL | Status: DC

## 2013-06-13 NOTE — Progress Notes (Signed)
  Subjective:     Lori Mays is a 27 y.o. female and is here for a comprehensive physical exam. The patient reports problems - multiple problems.  Notably arthralgias, multiple joints, stiffness, has trouble getting out of bed at times.  Also has swelling in abdomen and feet.  Also has cyst in arm, which has been present for some time, previously treated with abx, still present although smaller than previously. No true PCP.Marland Kitchen  History   Social History  . Marital Status: Single    Spouse Name: N/A    Number of Children: N/A  . Years of Education: N/A   Occupational History  . Not on file.   Social History Main Topics  . Smoking status: Never Smoker   . Smokeless tobacco: Never Used  . Alcohol Use: .6 - 1.2 oz/week    1-2 Glasses of wine per week  . Drug Use: No  . Sexually Active: Not Currently -- Female partner(s)   Other Topics Concern  . Not on file   Social History Narrative  . No narrative on file   Health Maintenance  Topic Date Due  . Tetanus/tdap  10/27/2005  . Influenza Vaccine  08/29/2013  . Pap Smear  03/12/2014    The following portions of the patient's history were reviewed and updated as appropriate: allergies, current medications, past family history, past medical history, past social history, past surgical history and problem list.  Review of Systems Pertinent items are noted in HPI.   Objective:    BP 105/67  Pulse 72  Ht 5\' 9"  (1.753 m)  Wt 135 lb 9.6 oz (61.508 kg)  BMI 20.02 kg/m2 General appearance: alert, cooperative and appears stated age Head: Normocephalic, without obvious abnormality, atraumatic Neck: no adenopathy, supple, symmetrical, trachea midline and thyroid not enlarged, symmetric, no tenderness/mass/nodules Lungs: clear to auscultation bilaterally Breasts: normal appearance, no masses or tenderness Heart: regular rate and rhythm, S1, S2 normal, no murmur, click, rub or gallop Abdomen: soft, non-tender; bowel sounds normal; no  masses,  no organomegaly Pelvic: cervix normal in appearance, external genitalia normal, no cervical motion tenderness, uterus normal size, shape, and consistency, vagina normal without discharge and diffuse tenderness on pelvic, no masses noted. Extremities: edema trace at ankles Pulses: 2+ and symmetric Skin: Skin color, texture, turgor normal. No rashes or lesions Lymph nodes: Cervical, supraclavicular, and axillary nodes normal. Neurologic: Grossly normal    Assessment:     GYN  female exam. STD check Arthralgias of unclear etiology. H/o Stage 3 endometriosis.      Plan:    Pap today and STD check. Continue Depo Check ANA, RF, TSH. See After Visit Summary for Counseling Recommendations

## 2013-06-13 NOTE — Assessment & Plan Note (Signed)
Check ANA, RF, TSH.

## 2013-06-13 NOTE — Patient Instructions (Addendum)
Preventive Care for Adults, Female A healthy lifestyle and preventive care can promote health and wellness. Preventive health guidelines for women include the following key practices.  A routine yearly physical is a good way to check with your caregiver about your health and preventive screening. It is a chance to share any concerns and updates on your health, and to receive a thorough exam.  Visit your dentist for a routine exam and preventive care every 6 months. Brush your teeth twice a day and floss once a day. Good oral hygiene prevents tooth decay and gum disease.  The frequency of eye exams is based on your age, health, family medical history, use of contact lenses, and other factors. Follow your caregiver's recommendations for frequency of eye exams.  Eat a healthy diet. Foods like vegetables, fruits, whole grains, low-fat dairy products, and lean protein foods contain the nutrients you need without too many calories. Decrease your intake of foods high in solid fats, added sugars, and salt. Eat the right amount of calories for you.Get information about a proper diet from your caregiver, if necessary.  Regular physical exercise is one of the most important things you can do for your health. Most adults should get at least 150 minutes of moderate-intensity exercise (any activity that increases your heart rate and causes you to sweat) each week. In addition, most adults need muscle-strengthening exercises on 2 or more days a week.  Maintain a healthy weight. The body mass index (BMI) is a screening tool to identify possible weight problems. It provides an estimate of body fat based on height and weight. Your caregiver can help determine your BMI, and can help you achieve or maintain a healthy weight.For adults 20 years and older:  A BMI below 18.5 is considered underweight.  A BMI of 18.5 to 24.9 is normal.  A BMI of 25 to 29.9 is considered overweight.  A BMI of 30 and above is  considered obese.  Maintain normal blood lipids and cholesterol levels by exercising and minimizing your intake of saturated fat. Eat a balanced diet with plenty of fruit and vegetables. Blood tests for lipids and cholesterol should begin at age 20 and be repeated every 5 years. If your lipid or cholesterol levels are high, you are over 50, or you are at high risk for heart disease, you may need your cholesterol levels checked more frequently.Ongoing high lipid and cholesterol levels should be treated with medicines if diet and exercise are not effective.  If you smoke, find out from your caregiver how to quit. If you do not use tobacco, do not start.  If you are pregnant, do not drink alcohol. If you are breastfeeding, be very cautious about drinking alcohol. If you are not pregnant and choose to drink alcohol, do not exceed 1 drink per day. One drink is considered to be 12 ounces (355 mL) of beer, 5 ounces (148 mL) of wine, or 1.5 ounces (44 mL) of liquor.  Avoid use of street drugs. Do not share needles with anyone. Ask for help if you need support or instructions about stopping the use of drugs.  High blood pressure causes heart disease and increases the risk of stroke. Your blood pressure should be checked at least every 1 to 2 years. Ongoing high blood pressure should be treated with medicines if weight loss and exercise are not effective.  If you are 55 to 27 years old, ask your caregiver if you should take aspirin to prevent strokes.  Diabetes   screening involves taking a blood sample to check your fasting blood sugar level. This should be done once every 3 years, after age 45, if you are within normal weight and without risk factors for diabetes. Testing should be considered at a younger age or be carried out more frequently if you are overweight and have at least 1 risk factor for diabetes.  Breast cancer screening is essential preventive care for women. You should practice "breast  self-awareness." This means understanding the normal appearance and feel of your breasts and may include breast self-examination. Any changes detected, no matter how small, should be reported to a caregiver. Women in their 20s and 30s should have a clinical breast exam (CBE) by a caregiver as part of a regular health exam every 1 to 3 years. After age 40, women should have a CBE every year. Starting at age 40, women should consider having a mammography (breast X-ray test) every year. Women who have a family history of breast cancer should talk to their caregiver about genetic screening. Women at a high risk of breast cancer should talk to their caregivers about having magnetic resonance imaging (MRI) and a mammography every year.  The Pap test is a screening test for cervical cancer. A Pap test can show cell changes on the cervix that might become cervical cancer if left untreated. A Pap test is a procedure in which cells are obtained and examined from the lower end of the uterus (cervix).  Women should have a Pap test starting at age 21.  Between ages 21 and 29, Pap tests should be repeated every 2 years.  Beginning at age 30, you should have a Pap test every 3 years as long as the past 3 Pap tests have been normal.  Some women have medical problems that increase the chance of getting cervical cancer. Talk to your caregiver about these problems. It is especially important to talk to your caregiver if a new problem develops soon after your last Pap test. In these cases, your caregiver may recommend more frequent screening and Pap tests.  The above recommendations are the same for women who have or have not gotten the vaccine for human papillomavirus (HPV).  If you had a hysterectomy for a problem that was not cancer or a condition that could lead to cancer, then you no longer need Pap tests. Even if you no longer need a Pap test, a regular exam is a good idea to make sure no other problems are  starting.  If you are between ages 65 and 70, and you have had normal Pap tests going back 10 years, you no longer need Pap tests. Even if you no longer need a Pap test, a regular exam is a good idea to make sure no other problems are starting.  If you have had past treatment for cervical cancer or a condition that could lead to cancer, you need Pap tests and screening for cancer for at least 20 years after your treatment.  If Pap tests have been discontinued, risk factors (such as a new sexual partner) need to be reassessed to determine if screening should be resumed.  The HPV test is an additional test that may be used for cervical cancer screening. The HPV test looks for the virus that can cause the cell changes on the cervix. The cells collected during the Pap test can be tested for HPV. The HPV test could be used to screen women aged 30 years and older, and should   be used in women of any age who have unclear Pap test results. After the age of 30, women should have HPV testing at the same frequency as a Pap test.  Colorectal cancer can be detected and often prevented. Most routine colorectal cancer screening begins at the age of 50 and continues through age 75. However, your caregiver may recommend screening at an earlier age if you have risk factors for colon cancer. On a yearly basis, your caregiver may provide home test kits to check for hidden blood in the stool. Use of a small camera at the end of a tube, to directly examine the colon (sigmoidoscopy or colonoscopy), can detect the earliest forms of colorectal cancer. Talk to your caregiver about this at age 50, when routine screening begins. Direct examination of the colon should be repeated every 5 to 10 years through age 75, unless early forms of pre-cancerous polyps or small growths are found.  Hepatitis C blood testing is recommended for all people born from 1945 through 1965 and any individual with known risks for hepatitis C.  Practice  safe sex. Use condoms and avoid high-risk sexual practices to reduce the spread of sexually transmitted infections (STIs). STIs include gonorrhea, chlamydia, syphilis, trichomonas, herpes, HPV, and human immunodeficiency virus (HIV). Herpes, HIV, and HPV are viral illnesses that have no cure. They can result in disability, cancer, and death. Sexually active women aged 25 and younger should be checked for chlamydia. Older women with new or multiple partners should also be tested for chlamydia. Testing for other STIs is recommended if you are sexually active and at increased risk.  Osteoporosis is a disease in which the bones lose minerals and strength with aging. This can result in serious bone fractures. The risk of osteoporosis can be identified using a bone density scan. Women ages 65 and over and women at risk for fractures or osteoporosis should discuss screening with their caregivers. Ask your caregiver whether you should take a calcium supplement or vitamin D to reduce the rate of osteoporosis.  Menopause can be associated with physical symptoms and risks. Hormone replacement therapy is available to decrease symptoms and risks. You should talk to your caregiver about whether hormone replacement therapy is right for you.  Use sunscreen with sun protection factor (SPF) of 30 or more. Apply sunscreen liberally and repeatedly throughout the day. You should seek shade when your shadow is shorter than you. Protect yourself by wearing long sleeves, pants, a wide-brimmed hat, and sunglasses year round, whenever you are outdoors.  Once a month, do a whole body skin exam, using a mirror to look at the skin on your back. Notify your caregiver of new moles, moles that have irregular borders, moles that are larger than a pencil eraser, or moles that have changed in shape or color.  Stay current with required immunizations.  Influenza. You need a dose every fall (or winter). The composition of the flu vaccine  changes each year, so being vaccinated once is not enough.  Pneumococcal polysaccharide. You need 1 to 2 doses if you smoke cigarettes or if you have certain chronic medical conditions. You need 1 dose at age 65 (or older) if you have never been vaccinated.  Tetanus, diphtheria, pertussis (Tdap, Td). Get 1 dose of Tdap vaccine if you are younger than age 65, are over 65 and have contact with an infant, are a healthcare worker, are pregnant, or simply want to be protected from whooping cough. After that, you need a Td   booster dose every 10 years. Consult your caregiver if you have not had at least 3 tetanus and diphtheria-containing shots sometime in your life or have a deep or dirty wound.  HPV. You need this vaccine if you are a woman age 26 or younger. The vaccine is given in 3 doses over 6 months.  Measles, mumps, rubella (MMR). You need at least 1 dose of MMR if you were born in 1957 or later. You may also need a second dose.  Meningococcal. If you are age 19 to 21 and a first-year college student living in a residence hall, or have one of several medical conditions, you need to get vaccinated against meningococcal disease. You may also need additional booster doses.  Zoster (shingles). If you are age 60 or older, you should get this vaccine.  Varicella (chickenpox). If you have never had chickenpox or you were vaccinated but received only 1 dose, talk to your caregiver to find out if you need this vaccine.  Hepatitis A. You need this vaccine if you have a specific risk factor for hepatitis A virus infection or you simply wish to be protected from this disease. The vaccine is usually given as 2 doses, 6 to 18 months apart.  Hepatitis B. You need this vaccine if you have a specific risk factor for hepatitis B virus infection or you simply wish to be protected from this disease. The vaccine is given in 3 doses, usually over 6 months. Preventive Services / Frequency Ages 19 to 39  Blood  pressure check.** / Every 1 to 2 years.  Lipid and cholesterol check.** / Every 5 years beginning at age 20.  Clinical breast exam.** / Every 3 years for women in their 20s and 30s.  Pap test.** / Every 2 years from ages 21 through 29. Every 3 years starting at age 30 through age 65 or 70 with a history of 3 consecutive normal Pap tests.  HPV screening.** / Every 3 years from ages 30 through ages 65 to 70 with a history of 3 consecutive normal Pap tests.  Hepatitis C blood test.** / For any individual with known risks for hepatitis C.  Skin self-exam. / Monthly.  Influenza immunization.** / Every year.  Pneumococcal polysaccharide immunization.** / 1 to 2 doses if you smoke cigarettes or if you have certain chronic medical conditions.  Tetanus, diphtheria, pertussis (Tdap, Td) immunization. / A one-time dose of Tdap vaccine. After that, you need a Td booster dose every 10 years.  HPV immunization. / 3 doses over 6 months, if you are 26 and younger.  Measles, mumps, rubella (MMR) immunization. / You need at least 1 dose of MMR if you were born in 1957 or later. You may also need a second dose.  Meningococcal immunization. / 1 dose if you are age 19 to 21 and a first-year college student living in a residence hall, or have one of several medical conditions, you need to get vaccinated against meningococcal disease. You may also need additional booster doses.  Varicella immunization.** / Consult your caregiver.  Hepatitis A immunization.** / Consult your caregiver. 2 doses, 6 to 18 months apart.  Hepatitis B immunization.** / Consult your caregiver. 3 doses usually over 6 months. Ages 40 to 64  Blood pressure check.** / Every 1 to 2 years.  Lipid and cholesterol check.** / Every 5 years beginning at age 20.  Clinical breast exam.** / Every year after age 40.  Mammogram.** / Every year beginning at age 40   and continuing for as long as you are in good health. Consult with your  caregiver.  Pap test.** / Every 3 years starting at age 30 through age 65 or 70 with a history of 3 consecutive normal Pap tests.  HPV screening.** / Every 3 years from ages 30 through ages 65 to 70 with a history of 3 consecutive normal Pap tests.  Fecal occult blood test (FOBT) of stool. / Every year beginning at age 50 and continuing until age 75. You may not need to do this test if you get a colonoscopy every 10 years.  Flexible sigmoidoscopy or colonoscopy.** / Every 5 years for a flexible sigmoidoscopy or every 10 years for a colonoscopy beginning at age 50 and continuing until age 75.  Hepatitis C blood test.** / For all people born from 1945 through 1965 and any individual with known risks for hepatitis C.  Skin self-exam. / Monthly.  Influenza immunization.** / Every year.  Pneumococcal polysaccharide immunization.** / 1 to 2 doses if you smoke cigarettes or if you have certain chronic medical conditions.  Tetanus, diphtheria, pertussis (Tdap, Td) immunization.** / A one-time dose of Tdap vaccine. After that, you need a Td booster dose every 10 years.  Measles, mumps, rubella (MMR) immunization. / You need at least 1 dose of MMR if you were born in 1957 or later. You may also need a second dose.  Varicella immunization.** / Consult your caregiver.  Meningococcal immunization.** / Consult your caregiver.  Hepatitis A immunization.** / Consult your caregiver. 2 doses, 6 to 18 months apart.  Hepatitis B immunization.** / Consult your caregiver. 3 doses, usually over 6 months. Ages 65 and over  Blood pressure check.** / Every 1 to 2 years.  Lipid and cholesterol check.** / Every 5 years beginning at age 20.  Clinical breast exam.** / Every year after age 40.  Mammogram.** / Every year beginning at age 40 and continuing for as long as you are in good health. Consult with your caregiver.  Pap test.** / Every 3 years starting at age 30 through age 65 or 70 with a 3  consecutive normal Pap tests. Testing can be stopped between 65 and 70 with 3 consecutive normal Pap tests and no abnormal Pap or HPV tests in the past 10 years.  HPV screening.** / Every 3 years from ages 30 through ages 65 or 70 with a history of 3 consecutive normal Pap tests. Testing can be stopped between 65 and 70 with 3 consecutive normal Pap tests and no abnormal Pap or HPV tests in the past 10 years.  Fecal occult blood test (FOBT) of stool. / Every year beginning at age 50 and continuing until age 75. You may not need to do this test if you get a colonoscopy every 10 years.  Flexible sigmoidoscopy or colonoscopy.** / Every 5 years for a flexible sigmoidoscopy or every 10 years for a colonoscopy beginning at age 50 and continuing until age 75.  Hepatitis C blood test.** / For all people born from 1945 through 1965 and any individual with known risks for hepatitis C.  Osteoporosis screening.** / A one-time screening for women ages 65 and over and women at risk for fractures or osteoporosis.  Skin self-exam. / Monthly.  Influenza immunization.** / Every year.  Pneumococcal polysaccharide immunization.** / 1 dose at age 65 (or older) if you have never been vaccinated.  Tetanus, diphtheria, pertussis (Tdap, Td) immunization. / A one-time dose of Tdap vaccine if you are over   65 and have contact with an infant, are a Research scientist (physical sciences), or simply want to be protected from whooping cough. After that, you need a Td booster dose every 10 years.  Varicella immunization.** / Consult your caregiver.  Meningococcal immunization.** / Consult your caregiver.  Hepatitis A immunization.** / Consult your caregiver. 2 doses, 6 to 18 months apart.  Hepatitis B immunization.** / Check with your caregiver. 3 doses, usually over 6 months. ** Family history and personal history of risk and conditions may change your caregiver's recommendations. Document Released: 02/10/2002 Document Revised: 03/08/2012  Document Reviewed: 05/12/2011 [facility] Patient Information 2014 [place], [place]. Arthralgia Your caregiver has diagnosed you as suffering from an arthralgia. Arthralgia means there is pain in a joint. This can come from many reasons including:  Bruising the joint which causes soreness (inflammation) in the joint.  Wear and tear on the joints which occur as we grow older (osteoarthritis).  Overusing the joint.  Various forms of arthritis.  Infections of the joint. Regardless of the cause of pain in your joint, most of these different pains respond to anti-inflammatory drugs and rest. The exception to this is when a joint is infected, and these cases are treated with antibiotics, if it is a bacterial infection. HOME CARE INSTRUCTIONS   Rest the injured area for as long as directed by your caregiver. Then slowly start using the joint as directed by your caregiver and as the pain allows. Crutches as directed may be useful if the ankles, knees or hips are involved. If the knee was splinted or casted, continue use and care as directed. If an stretchy or elastic wrapping bandage has been applied today, it should be removed and re-applied every 3 to 4 hours. It should not be applied tightly, but firmly enough to keep swelling down. Watch toes and feet for swelling, bluish discoloration, coldness, numbness or excessive pain. If any of these problems (symptoms) occur, remove the ace bandage and re-apply more loosely. If these symptoms persist, contact your caregiver or return to this location.  For the first 24 hours, keep the injured extremity elevated on pillows while lying down.  Apply ice for 15-20 minutes to the sore joint every couple hours while awake for the first half day. Then 3-4 times per day for the first 48 hours. Put the ice in a plastic bag and place a towel between the bag of ice and your skin.  Wear any splinting, casting, elastic bandage applications, or slings as instructed.  Only  take over-the-counter or prescription medicines for pain, discomfort, or fever as directed by your caregiver. Do not use aspirin immediately after the injury unless instructed by your physician. Aspirin can cause increased bleeding and bruising of the tissues.  If you were given crutches, continue to use them as instructed and do not resume weight bearing on the sore joint until instructed. Persistent pain and inability to use the sore joint as directed for more than 2 to 3 days are warning signs indicating that you should see a caregiver for a follow-up visit as soon as possible. Initially, a hairline fracture (break in bone) may not be evident on X-rays. Persistent pain and swelling indicate that further evaluation, non-weight bearing or use of the joint (use of crutches or slings as instructed), or further X-rays are indicated. X-rays may sometimes not show a small fracture until a week or 10 days later. Make a follow-up appointment with your own caregiver or one to whom we have referred you. A radiologist (  specialist in reading X-rays) may read your X-rays. Make sure you know how you are to obtain your X-ray results. Do not assume everything is normal if you do not hear from [place]. SEEK MEDICAL CARE IF: Bruising, swelling, or pain increases. SEEK IMMEDIATE MEDICAL CARE IF:   Your fingers or toes are numb or blue.  The pain is not responding to medications and continues to stay the same or get worse.  The pain in your joint becomes severe.  You develop a fever over 102 F (38.9 C).  It becomes impossible to move or use the joint. MAKE SURE YOU:   Understand these instructions.  Will watch your condition.  Will get help right away if you are not doing well or get worse. Document Released: 12/15/2005 Document Revised: 03/08/2012 Document Reviewed: 08/02/2008 [facility] Patient Information 2014 [place], [place].

## 2013-06-13 NOTE — Assessment & Plan Note (Signed)
Continue Depo-next shot due 07/06/13

## 2013-06-14 ENCOUNTER — Telehealth: Payer: Self-pay | Admitting: *Deleted

## 2013-06-14 NOTE — Telephone Encounter (Addendum)
Message copied by Jill Side on Tue Jun 14, 2013  1:41 PM ------      Message from: Reva Bores      Created: Tue Jun 14, 2013  9:55 AM       Labs from yesterday were negative--still may need primary care to continue w/u.  Please refer to new [facility] ------ Called pt and informed her of Dr. Tawni Levy message. She will need follow up with a PCP.  Referral appt has been made at the [facility] @ [address] on 06/22/13 @ 1115, [phone]. She will need to bring photo ID, all meds and $20 to the first visit. She will be given an application for the orange card. Pt agreed to appt and voiced understanding of all information given.

## 2013-06-16 ENCOUNTER — Telehealth: Payer: Self-pay

## 2013-06-16 ENCOUNTER — Encounter: Payer: Self-pay | Admitting: Family Medicine

## 2013-06-16 DIAGNOSIS — IMO0002 Reserved for concepts with insufficient information to code with codable children: Secondary | ICD-10-CM | POA: Insufficient documentation

## 2013-06-16 HISTORY — DX: Reserved for concepts with insufficient information to code with codable children: IMO0002

## 2013-06-16 NOTE — Telephone Encounter (Signed)
Called pt and informed pt of her results and the need for her to have a colposcopy.  I explained to the pt what a colpo is.  I also informed her that her appt time is for June 23rd @ 130pm.  Pt stated understanding and did not have any further questions.

## 2013-06-16 NOTE — Telephone Encounter (Signed)
Message copied by Faythe Casa on Thu Jun 16, 2013  1:44 PM ------      Message from: Reva Bores      Created: Thu Jun 16, 2013 12:45 PM       Needs colpo ------

## 2013-06-20 ENCOUNTER — Ambulatory Visit (INDEPENDENT_AMBULATORY_CARE_PROVIDER_SITE_OTHER): Payer: Self-pay | Admitting: Obstetrics & Gynecology

## 2013-06-20 ENCOUNTER — Other Ambulatory Visit (HOSPITAL_COMMUNITY)
Admission: RE | Admit: 2013-06-20 | Discharge: 2013-06-20 | Disposition: A | Discharge status: Home / Self Care | Payer: Self-pay | Source: Ambulatory Visit | Attending: Obstetrics & Gynecology | Admitting: Obstetrics & Gynecology

## 2013-06-20 ENCOUNTER — Encounter: Payer: Self-pay | Admitting: Obstetrics & Gynecology

## 2013-06-20 VITALS — BP 119/82 | HR 62 | Temp 98.7°F | Ht 69.0 in | Wt 134.4 lb

## 2013-06-20 DIAGNOSIS — R6889 Other general symptoms and signs: Secondary | ICD-10-CM

## 2013-06-20 DIAGNOSIS — IMO0002 Reserved for concepts with insufficient information to code with codable children: Secondary | ICD-10-CM

## 2013-06-20 DIAGNOSIS — Z23 Encounter for immunization: Secondary | ICD-10-CM

## 2013-06-20 DIAGNOSIS — R87619 Unspecified abnormal cytological findings in specimens from cervix uteri: Secondary | ICD-10-CM | POA: Insufficient documentation

## 2013-06-20 MED ORDER — HPV QUADRIVALENT VACCINE IM SUSP: 0.5000 mL | Freq: Once | INTRAMUSCULAR | Status: DC

## 2013-06-20 NOTE — Progress Notes (Signed)
  Subjective:    Patient ID: Ralene Ok, female    DOB: [birth year], 27 y.o.   MRN: [id number]  HPI  27 yo S AA young lady with HR HPV DNA + ASCUS pap last week. She is here for a colposcopy   Review of Systems  She has not had the Gardasil series yet.    Objective:   Physical Exam  UPT negative, consent signed, time out done Cervix prepped with acetic acid. Transformation zone seen in its entirety. Colpo adequate. No abnormalities noted. ECC obtained. She tolerated the procedure well.       Assessment & Plan:  Preventative care- start Gardasil series today. RTC 2 months ASCUS pap- colpo normal. Await ECC.

## 2013-06-20 NOTE — Addendum Note (Signed)
Addended by: Faythe Casa on: 06/20/2013 02:46 PM   Modules accepted: Orders

## 2013-06-22 ENCOUNTER — Ambulatory Visit: Payer: Self-pay | Attending: Family Medicine | Admitting: Internal Medicine

## 2013-06-22 VITALS — BP 117/76 | HR 66 | Temp 98.9°F | Resp 14 | Ht 69.0 in | Wt 135.2 lb

## 2013-06-22 DIAGNOSIS — M2550 Pain in unspecified joint: Secondary | ICD-10-CM | POA: Insufficient documentation

## 2013-06-22 DIAGNOSIS — G43909 Migraine, unspecified, not intractable, without status migrainosus: Secondary | ICD-10-CM | POA: Insufficient documentation

## 2013-06-22 DIAGNOSIS — J4520 Mild intermittent asthma, uncomplicated: Secondary | ICD-10-CM

## 2013-06-22 DIAGNOSIS — R635 Abnormal weight gain: Secondary | ICD-10-CM

## 2013-06-22 DIAGNOSIS — J45909 Unspecified asthma, uncomplicated: Secondary | ICD-10-CM | POA: Insufficient documentation

## 2013-06-22 DIAGNOSIS — N809 Endometriosis, unspecified: Secondary | ICD-10-CM | POA: Insufficient documentation

## 2013-06-22 DIAGNOSIS — Z0000 Encounter for general adult medical examination without abnormal findings: Secondary | ICD-10-CM | POA: Insufficient documentation

## 2013-06-22 DIAGNOSIS — Z79899 Other long term (current) drug therapy: Secondary | ICD-10-CM | POA: Insufficient documentation

## 2013-06-22 DIAGNOSIS — K5900 Constipation, unspecified: Secondary | ICD-10-CM | POA: Insufficient documentation

## 2013-06-22 MED ORDER — NAPROXEN 500 MG PO TABS: 500.0000 mg | ORAL_TABLET | Freq: Two times a day (BID) | ORAL | Status: DC | PRN

## 2013-06-22 MED ORDER — ALBUTEROL SULFATE HFA 108 (90 BASE) MCG/ACT IN AERS: 2.0000 | INHALATION_SPRAY | Freq: Four times a day (QID) | RESPIRATORY_TRACT | Status: DC | PRN

## 2013-06-22 NOTE — Progress Notes (Signed)
Patient ID: Lori Mays, female   DOB: Oct 15, 1986, 27 y.o.   MRN: [id number] Patient Demographics  Lori Mays, is a 27 y.o. female  YQM:[id number]  [id number]  DOB - 04-13-1986  Chief Complaint  Patient presents with  . Establish Care        Subjective:   Lori Mays today is here to establish primary care. Patient has No chest pain, No abdominal pain - No Nausea, No new weakness tingling or numbness, No Cough - SOB.  Patient reports she recently diagnosed with endometriosis and was seen at the [facility]. Patient was recommended to have primary care established. Patient reports intermittent migraine headaches, insomnia, arthralgias. She has intermittent constipation and fluctuation in her weight. She had some blood work done at [facility]: TSH within normal limits 0.5, ANA negative, rheumatoid factor less than 10, RPR, HIV nonreactive, urine pregnancy test negative   Objective:    Filed Vitals:   06/22/13 1224  BP: 117/76  Pulse: 66  Temp: 98.9 F (37.2 C)  TempSrc: Oral  Resp: 14  Height: 5\' 9"  (1.753 m)  Weight: 135 lb 3.2 oz (61.326 kg)  SpO2: 100%     ALLERGIES:   Allergies  Allergen Reactions  . Diphenhydramine Hcl     Asthma attacks  . Latex Itching    PAST MEDICAL HISTORY: Past Medical History  Diagnosis Date  . Asthma   . Endometriosis   . Cyst 06/07/2012    PAST SURGICAL HISTORY: Past Surgical History  Procedure Laterality Date  . Laproscopy  2 years ago    FAMILY HISTORY: History reviewed. No pertinent family history.  MEDICATIONS AT HOME: Prior to Admission medications   Medication Sig Start Date End Date Taking? Authorizing Provider  acetaminophen (TYLENOL) 500 MG tablet Take 1,000 mg by mouth every 6 (six) hours as needed for pain.    Historical Provider, MD  albuterol (PROVENTIL HFA;VENTOLIN HFA) 108 (90 BASE) MCG/ACT inhaler Inhale 2 puffs into the lungs every 6 (six) hours as needed for wheezing or  shortness of breath. For asthma 06/22/13   Keona Bilyeu Jenna Luo, MD  calcium carbonate (OS-CAL) 600 MG TABS Take 600 mg by mouth 2 (two) times daily.      Historical Provider, MD  hpv vaccine (GARDASIL) injection Inject 0.5 mLs into the muscle once. 06/20/13   [name], MD  HYDROcodone-acetaminophen (NORCO/VICODIN) 5-325 MG per tablet Take 2 tablets by mouth every 4 (four) hours as needed for pain. 04/19/13   [name], PA-C  naproxen (NAPROSYN) 500 MG tablet Take 1 tablet (500 mg total) by mouth 2 (two) times daily as needed. 06/22/13   [name] Jenna Luo, MD  promethazine (PHENERGAN) 25 MG tablet Take 1 tablet (25 mg total) by mouth every 6 (six) hours as needed for nausea. 04/19/13   [name], PA-C    REVIEW OF SYSTEMS:  Constitutional:   No   Fevers, chills, fatigue.  HEENT:    No headaches, Sore throat,   Cardio-vascular: No chest pain,  Orthopnea, swelling in lower extremities, anasarca, palpitations  GI:  No abdominal pain, nausea, vomiting, diarrhea  Resp: No shortness of breath,  No coughing up of blood.No cough.No wheezing.  Skin:  no rash or lesions.  GU:  no dysuria, change in color of urine, no urgency or frequency.  No flank pain.  Musculoskeletal: Please see history of present illness  Psych: No change in mood or affect. No depression or anxiety.  No memory loss.   Exam  General appearance :Awake, alert, NAD, Speech Clear. HEENT: Atraumatic and Normocephalic, PERLA Neck: supple, no JVD. No cervical lymphadenopathy.  Chest: clear to auscultation bilaterally, no wheezing, rales or rhonchi CVS: S1 S2 regular, no murmurs.  Abdomen: soft, NBS, NT, ND, no gaurding, rigidity or rebound. Extremities: No cyanosis, clubbing, B/L Lower Ext shows no edema,  Neurology: Awake alert, and oriented X 3, CN II-XII intact, Non focal Skin:No Rash or lesions Wounds: N/A    Data Review   Basic Metabolic Panel: No results found for this basename: NA, K, CL, CO2,  GLUCOSE, BUN, CREATININE, CALCIUM, MG, PHOS,  in the last 168 hours Liver Function Tests: No results found for this basename: AST, ALT, ALKPHOS, BILITOT, PROT, ALBUMIN,  in the last 168 hours  CBC: No results found for this basename: WBC, NEUTROABS, HGB, HCT, MCV, PLT,  in the last 168 hours ------------------------------------------------------------------------------------------------------------------ No results found for this basename: HGBA1C,  in the last 72 hours ------------------------------------------------------------------------------------------------------------------ No results found for this basename: CHOL, HDL, LDLCALC, TRIG, CHOLHDL, LDLDIRECT,  in the last 72 hours ------------------------------------------------------------------------------------------------------------------ No results found for this basename: TSH, T4TOTAL, FREET3, T3FREE, THYROIDAB,  in the last 72 hours ------------------------------------------------------------------------------------------------------------------ No results found for this basename: VITAMINB12, FOLATE, FERRITIN, TIBC, IRON, RETICCTPCT,  in the last 72 hours  Coagulation profile  No results found for this basename: INR, PROTIME,  in the last 168 hours    Assessment & Plan   Active Problems: 1) arthralgia's with constipation and fluctuating weight - Patient had some blood work done at [facility]: TSH within normal limits 0.5, ANA negative, rheumatoid factor less than 10, RPR, HIV nonreactive, urine pregnancy test negative. - Will check CBC, BMET, T3 and free T4  2) migraine headaches:  - Given prescription for Naproxen. Patient reports that it works for her migraine headaches  3) endometriosis: [facility]  4) chronic intermittent asthma, uncomplicated - Continue albuterol inhaler as needed, prescription given  Recommendations: Check CBC, BMET, T3 and free T4   Follow-up in 3  months   Alverto Shedd M.D. 06/22/2013, 1:17 PM

## 2013-06-22 NOTE — Progress Notes (Signed)
Pt here to establish primary care s/p visit to [facility] for hx endometriosis. Pt had pap smear done and f/u last week. Denies bleeding or abd cramping.vss

## 2013-07-04 ENCOUNTER — Encounter: Payer: Self-pay | Admitting: *Deleted

## 2013-07-06 ENCOUNTER — Encounter: Payer: Self-pay | Admitting: *Deleted

## 2013-07-06 ENCOUNTER — Ambulatory Visit (INDEPENDENT_AMBULATORY_CARE_PROVIDER_SITE_OTHER): Payer: Self-pay | Admitting: *Deleted

## 2013-07-06 VITALS — BP 108/77 | HR 62 | Ht 69.0 in | Wt 133.4 lb

## 2013-07-06 DIAGNOSIS — Z3049 Encounter for surveillance of other contraceptives: Secondary | ICD-10-CM

## 2013-07-06 MED ORDER — MEDROXYPROGESTERONE ACETATE 150 MG/ML IM SUSP
150.0000 mg | Freq: Once | INTRAMUSCULAR | Status: AC
  Administered 2013-07-06: 150 mg via INTRAMUSCULAR

## 2013-08-22 ENCOUNTER — Ambulatory Visit: Payer: Self-pay

## 2013-08-31 ENCOUNTER — Ambulatory Visit (INDEPENDENT_AMBULATORY_CARE_PROVIDER_SITE_OTHER): Payer: Self-pay | Admitting: *Deleted

## 2013-08-31 VITALS — BP 116/79 | HR 80 | Temp 99.3°F

## 2013-08-31 DIAGNOSIS — Z23 Encounter for immunization: Secondary | ICD-10-CM

## 2013-08-31 NOTE — Progress Notes (Signed)
Patient her for 2nd gardisil- but clinic out of gardisil- ordered new package- and will reschedule for tomorrow.

## 2013-09-01 ENCOUNTER — Ambulatory Visit (INDEPENDENT_AMBULATORY_CARE_PROVIDER_SITE_OTHER): Payer: Self-pay | Admitting: Obstetrics and Gynecology

## 2013-09-01 VITALS — BP 102/68 | Wt 133.0 lb

## 2013-09-01 DIAGNOSIS — Z23 Encounter for immunization: Secondary | ICD-10-CM

## 2013-09-01 MED ORDER — HPV QUADRIVALENT VACCINE IM SUSP
0.5000 mL | Freq: Once | INTRAMUSCULAR | Status: AC
  Administered 2013-09-01: 0.5 mL via INTRAMUSCULAR

## 2013-09-21 ENCOUNTER — Ambulatory Visit (INDEPENDENT_AMBULATORY_CARE_PROVIDER_SITE_OTHER): Payer: Self-pay | Admitting: *Deleted

## 2013-09-21 VITALS — BP 110/72 | HR 80 | Resp 20 | Wt 136.7 lb

## 2013-09-21 DIAGNOSIS — N949 Unspecified condition associated with female genital organs and menstrual cycle: Secondary | ICD-10-CM

## 2013-09-21 DIAGNOSIS — Z3049 Encounter for surveillance of other contraceptives: Secondary | ICD-10-CM

## 2013-09-21 DIAGNOSIS — N925 Other specified irregular menstruation: Secondary | ICD-10-CM

## 2013-09-21 DIAGNOSIS — Z3042 Encounter for surveillance of injectable contraceptive: Secondary | ICD-10-CM

## 2013-11-23 ENCOUNTER — Ambulatory Visit (INDEPENDENT_AMBULATORY_CARE_PROVIDER_SITE_OTHER): Payer: Self-pay

## 2013-11-23 VITALS — BP 118/76 | HR 65 | Temp 98.2°F | Ht 69.0 in | Wt 132.8 lb

## 2013-11-23 DIAGNOSIS — R6889 Other general symptoms and signs: Secondary | ICD-10-CM

## 2013-11-23 DIAGNOSIS — Z23 Encounter for immunization: Secondary | ICD-10-CM

## 2013-11-23 DIAGNOSIS — IMO0002 Reserved for concepts with insufficient information to code with codable children: Secondary | ICD-10-CM

## 2013-11-23 NOTE — Progress Notes (Signed)
Pt. Here at clinic for 3rd dose of Guardasil injection. Pt. States she received her first injection on 06/20/13.

## 2013-12-07 ENCOUNTER — Ambulatory Visit (INDEPENDENT_AMBULATORY_CARE_PROVIDER_SITE_OTHER): Payer: Self-pay

## 2013-12-07 VITALS — BP 121/82 | HR 68 | Temp 99.1°F | Ht 69.0 in | Wt 133.9 lb

## 2013-12-07 DIAGNOSIS — N938 Other specified abnormal uterine and vaginal bleeding: Secondary | ICD-10-CM

## 2013-12-07 DIAGNOSIS — N949 Unspecified condition associated with female genital organs and menstrual cycle: Secondary | ICD-10-CM

## 2013-12-07 MED ORDER — MEDROXYPROGESTERONE ACETATE 104 MG/0.65ML ~~LOC~~ SUSP
104.0000 mg | Freq: Once | SUBCUTANEOUS | Status: AC
  Administered 2013-12-07: 104 mg via SUBCUTANEOUS

## 2014-03-15 ENCOUNTER — Ambulatory Visit (INDEPENDENT_AMBULATORY_CARE_PROVIDER_SITE_OTHER): Payer: Self-pay | Admitting: General Practice

## 2014-03-15 VITALS — BP 113/77 | HR 67 | Temp 97.9°F | Ht 67.0 in | Wt 129.7 lb

## 2014-03-15 DIAGNOSIS — N809 Endometriosis, unspecified: Secondary | ICD-10-CM

## 2014-03-15 MED ORDER — MEDROXYPROGESTERONE ACETATE 104 MG/0.65ML ~~LOC~~ SUSP
104.0000 mg | Freq: Once | SUBCUTANEOUS | Status: AC
  Administered 2014-03-15: 104 mg via SUBCUTANEOUS

## 2014-06-14 ENCOUNTER — Ambulatory Visit (INDEPENDENT_AMBULATORY_CARE_PROVIDER_SITE_OTHER): Payer: Self-pay | Admitting: Obstetrics and Gynecology

## 2014-06-14 VITALS — BP 120/83 | HR 73 | Wt 128.0 lb

## 2014-06-14 DIAGNOSIS — Z3049 Encounter for surveillance of other contraceptives: Secondary | ICD-10-CM

## 2014-06-14 DIAGNOSIS — Z309 Encounter for contraceptive management, unspecified: Secondary | ICD-10-CM

## 2014-06-14 MED ORDER — MEDROXYPROGESTERONE ACETATE 104 MG/0.65ML ~~LOC~~ SUSP
104.0000 mg | Freq: Once | SUBCUTANEOUS | Status: AC
  Administered 2014-06-14: 104 mg via SUBCUTANEOUS

## 2014-07-17 ENCOUNTER — Ambulatory Visit (INDEPENDENT_AMBULATORY_CARE_PROVIDER_SITE_OTHER): Payer: Self-pay | Admitting: Obstetrics & Gynecology

## 2014-07-17 ENCOUNTER — Encounter: Payer: Self-pay | Admitting: Obstetrics & Gynecology

## 2014-07-17 VITALS — BP 130/69 | HR 69 | Temp 99.0°F | Ht 68.0 in | Wt 132.3 lb

## 2014-07-17 DIAGNOSIS — Z01419 Encounter for gynecological examination (general) (routine) without abnormal findings: Secondary | ICD-10-CM

## 2014-07-17 NOTE — Patient Instructions (Signed)
`RE: MyChart  Dear Ms. Lori Mays  We are excited to introduce MyChart, a new best-in-class service that provides you online access to important information in your electronic medical record. We want to make it easier for you to view your health information - all in one secure location - when and where you need it. We expect MyChart will enhance the quality of care and service we provide. Use the activation code below to enroll in MyChart online at [email or web address]  When you register for MyChart, you can:    View your test results.   Communicate securely with your physician's office.    View your medical history, allergies, medications, and immunizations.   Conveniently print information such as your medication lists.  If you are age 50 or older and want a member of your family to have access to your record, you must provide written consent by completing a proxy form available at our facility. Please speak to our clinical staff about guidelines regarding accounts for patients younger than age 82.  As you activate your MyChart account and need any technical assistance, please call the MyChart technical support line at [phone]) or email your question to [email or web address]. If you email your question(s), please include your name, a return phone number and the best time to reach you.  Thank you for using MyChart as your new health and wellness resource!  MyChart Activation Code:  [email or web address] Expires: 08/13/2014  3:24 PM    Preventive Care for Adults A healthy lifestyle and preventive care can promote health and wellness. Preventive health guidelines for women include the following key practices.  A routine yearly physical is a good way to check with your health care provider about your health and preventive screening. It is a chance to share any concerns and updates on your health and to receive a thorough exam.  Visit your dentist for a routine  exam and preventive care every 6 months. Brush your teeth twice a day and floss once a day. Good oral hygiene prevents tooth decay and gum disease.  The frequency of eye exams is based on your age, health, family medical history, use of contact lenses, and other factors. Follow your health care provider's recommendations for frequency of eye exams.  Eat a healthy diet. Foods like vegetables, fruits, whole grains, low-fat dairy products, and lean protein foods contain the nutrients you need without too many calories. Decrease your intake of foods high in solid fats, added sugars, and salt. Eat the right amount of calories for you.Get information about a proper diet from your health care provider, if necessary.  Regular physical exercise is one of the most important things you can do for your health. Most adults should get at least 150 minutes of moderate-intensity exercise (any activity that increases your heart rate and causes you to sweat) each week. In addition, most adults need muscle-strengthening exercises on 2 or more days a week.  Maintain a healthy weight. The body mass index (BMI) is a screening tool to identify possible weight problems. It provides an estimate of body fat based on height and weight. Your health care provider can find your BMI, and can help you achieve or maintain a healthy weight.For adults 20 years and older:  A BMI below 18.5 is considered underweight.  A BMI of 18.5 to 24.9 is normal.  A BMI of 25 to 29.9 is considered overweight.  A BMI of 30 and above is considered obese.  Maintain normal blood lipids and cholesterol  levels by exercising and minimizing your intake of saturated fat. Eat a balanced diet with plenty of fruit and vegetables. Blood tests for lipids and cholesterol should begin at age 25 and be repeated every 5 years. If your lipid or cholesterol levels are high, you are over 50, or you are at high risk for heart disease, you may need your cholesterol  levels checked more frequently.Ongoing high lipid and cholesterol levels should be treated with medicines if diet and exercise are not working.  If you smoke, find out from your health care provider how to quit. If you do not use tobacco, do not start.  Lung cancer screening is recommended for adults aged 109-80 years who are at high risk for developing lung cancer because of a history of smoking. A yearly low-dose CT scan of the lungs is recommended for people who have at least a 30-pack-year history of smoking and are a current smoker or have quit within the past 15 years. A pack year of smoking is smoking an average of 1 pack of cigarettes a day for 1 year (for example: 1 pack a day for 30 years or 2 packs a day for 15 years). Yearly screening should continue until the smoker has stopped smoking for at least 15 years. Yearly screening should be stopped for people who develop a health problem that would prevent them from having lung cancer treatment.  If you are pregnant, do not drink alcohol. If you are breastfeeding, be very cautious about drinking alcohol. If you are not pregnant and choose to drink alcohol, do not have more than 1 drink per day. One drink is considered to be 12 ounces (355 mL) of beer, 5 ounces (148 mL) of wine, or 1.5 ounces (44 mL) of liquor.  Avoid use of street drugs. Do not share needles with anyone. Ask for help if you need support or instructions about stopping the use of drugs.  High blood pressure causes heart disease and increases the risk of stroke. Your blood pressure should be checked at least every 1 to 2 years. Ongoing high blood pressure should be treated with medicines if weight loss and exercise do not work.  If you are 79-66 years old, ask your health care provider if you should take aspirin to prevent strokes.  Diabetes screening involves taking a blood sample to check your fasting blood sugar level. This should be done once every 3 years, after age 75, if you  are within normal weight and without risk factors for diabetes. Testing should be considered at a younger age or be carried out more frequently if you are overweight and have at least 1 risk factor for diabetes.  Breast cancer screening is essential preventive care for women. You should practice "breast self-awareness." This means understanding the normal appearance and feel of your breasts and may include breast self-examination. Any changes detected, no matter how small, should be reported to a health care provider. Women in their 32s and 30s should have a clinical breast exam (CBE) by a health care provider as part of a regular health exam every 1 to 3 years. After age 60, women should have a CBE every year. Starting at age 81, women should consider having a mammogram (breast X-ray test) every year. Women who have a family history of breast cancer should talk to their health care provider about genetic screening. Women at a high risk of breast cancer should talk to their health care providers about having an MRI and a  mammogram every year.  Breast cancer gene (BRCA)-related cancer risk assessment is recommended for women who have family members with BRCA-related cancers. BRCA-related cancers include breast, ovarian, tubal, and peritoneal cancers. Having family members with these cancers may be associated with an increased risk for harmful changes (mutations) in the breast cancer genes BRCA1 and BRCA2. Results of the assessment will determine the need for genetic counseling and BRCA1 and BRCA2 testing.  Routine pelvic exams to screen for cancer are no longer recommended for nonpregnant women who are considered low risk for cancer of the pelvic organs (ovaries, uterus, and vagina) and who do not have symptoms. Ask your health care provider if a screening pelvic exam is right for you.  If you have had past treatment for cervical cancer or a condition that could lead to cancer, you need Pap tests and  screening for cancer for at least 20 years after your treatment. If Pap tests have been discontinued, your risk factors (such as having a new sexual partner) need to be reassessed to determine if screening should be resumed. Some women have medical problems that increase the chance of getting cervical cancer. In these cases, your health care provider may recommend more frequent screening and Pap tests.  The HPV test is an additional test that may be used for cervical cancer screening. The HPV test looks for the virus that can cause the cell changes on the cervix. The cells collected during the Pap test can be tested for HPV. The HPV test could be used to screen women aged 56 years and older, and should be used in women of any age who have unclear Pap test results. After the age of 67, women should have HPV testing at the same frequency as a Pap test.  Colorectal cancer can be detected and often prevented. Most routine colorectal cancer screening begins at the age of 40 years and continues through age 54 years. However, your health care provider may recommend screening at an earlier age if you have risk factors for colon cancer. On a yearly basis, your health care provider may provide home test kits to check for hidden blood in the stool. Use of a small camera at the end of a tube, to directly examine the colon (sigmoidoscopy or colonoscopy), can detect the earliest forms of colorectal cancer. Talk to your health care provider about this at age 26, when routine screening begins. Direct exam of the colon should be repeated every 5-10 years through age 22 years, unless early forms of pre-cancerous polyps or small growths are found.  People who are at an increased risk for hepatitis B should be screened for this virus. You are considered at high risk for hepatitis B if:  You were born in a country where hepatitis B occurs often. Talk with your health care provider about which countries are considered high  risk.  Your parents were born in a high-risk country and you have not received a shot to protect against hepatitis B (hepatitis B vaccine).  You have HIV or AIDS.  You use needles to inject street drugs.  You live with, or have sex with, someone who has Hepatitis B.  You get hemodialysis treatment.  You take certain medicines for conditions like cancer, organ transplantation, and autoimmune conditions.  Hepatitis C blood testing is recommended for all people born from [birth year] through 1965 and any individual with known risks for hepatitis C.  Practice safe sex. Use condoms and avoid high-risk sexual practices to reduce the  spread of sexually transmitted infections (STIs). STIs include gonorrhea, chlamydia, syphilis, trichomonas, herpes, HPV, and human immunodeficiency virus (HIV). Herpes, HIV, and HPV are viral illnesses that have no cure. They can result in disability, cancer, and death.  You should be screened for sexually transmitted illnesses (STIs) including gonorrhea and chlamydia if:  You are sexually active and are younger than 24 years.  You are older than 24 years and your health care provider tells you that you are at risk for this type of infection.  Your sexual activity has changed since you were last screened and you are at an increased risk for chlamydia or gonorrhea. Ask your health care provider if you are at risk.  If you are at risk of being infected with HIV, it is recommended that you take a prescription medicine daily to prevent HIV infection. This is called preexposure prophylaxis (PrEP). You are considered at risk if:  You are a heterosexual woman, are sexually active, and are at increased risk for HIV infection.  You take drugs by injection.  You are sexually active with a partner who has HIV.  Talk with your health care provider about whether you are at high risk of being infected with HIV. If you choose to begin PrEP, you should first be tested for HIV. You  should then be tested every 3 months for as long as you are taking PrEP.  Osteoporosis is a disease in which the bones lose minerals and strength with aging. This can result in serious bone fractures or breaks. The risk of osteoporosis can be identified using a bone density scan. Women ages 65 years and over and women at risk for fractures or osteoporosis should discuss screening with their health care providers. Ask your health care provider whether you should take a calcium supplement or vitamin D to reduce the rate of osteoporosis.  Menopause can be associated with physical symptoms and risks. Hormone replacement therapy is available to decrease symptoms and risks. You should talk to your health care provider about whether hormone replacement therapy is right for you.  Use sunscreen. Apply sunscreen liberally and repeatedly throughout the day. You should seek shade when your shadow is shorter than you. Protect yourself by wearing long sleeves, pants, a wide-brimmed hat, and sunglasses year round, whenever you are outdoors.  Once a month, do a whole body skin exam, using a mirror to look at the skin on your back. Tell your health care provider of new moles, moles that have irregular borders, moles that are larger than a pencil eraser, or moles that have changed in shape or color.  Stay current with required vaccines (immunizations).  Influenza vaccine. All adults should be immunized every year.  Tetanus, diphtheria, and acellular pertussis (Td, Tdap) vaccine. Pregnant women should receive 1 dose of Tdap vaccine during each pregnancy. The dose should be obtained regardless of the length of time since the last dose. Immunization is preferred during the 27th-36th week of gestation. An adult who has not previously received Tdap or who does not know her vaccine status should receive 1 dose of Tdap. This initial dose should be followed by tetanus and diphtheria toxoids (Td) booster doses every 10 years.  Adults with an unknown or incomplete history of completing a 3-dose immunization series with Td-containing vaccines should begin or complete a primary immunization series including a Tdap dose. Adults should receive a Td booster every 10 years.  Varicella vaccine. An adult without evidence of immunity to varicella should receive 2  doses or a second dose if she has previously received 1 dose. Pregnant females who do not have evidence of immunity should receive the first dose after pregnancy. This first dose should be obtained before leaving the health care facility. The second dose should be obtained 4-8 weeks after the first dose.  Human papillomavirus (HPV) vaccine. Females aged 13-26 years who have not received the vaccine previously should obtain the 3-dose series. The vaccine is not recommended for use in pregnant females. However, pregnancy testing is not needed before receiving a dose. If a female is found to be pregnant after receiving a dose, no treatment is needed. In that case, the remaining doses should be delayed until after the pregnancy. Immunization is recommended for any person with an immunocompromised condition through the age of 43 years if she did not get any or all doses earlier. During the 3-dose series, the second dose should be obtained 4-8 weeks after the first dose. The third dose should be obtained 24 weeks after the first dose and 16 weeks after the second dose.  Zoster vaccine. One dose is recommended for adults aged 19 years or older unless certain conditions are present.  Measles, mumps, and rubella (MMR) vaccine. Adults born before [birth year] generally are considered immune to measles and mumps. Adults born in [birth year] or later should have 1 or more doses of MMR vaccine unless there is a contraindication to the vaccine or there is laboratory evidence of immunity to each of the three diseases. A routine second dose of MMR vaccine should be obtained at least 28 days after the first dose  for students attending postsecondary schools, health care workers, or international travelers. People who received inactivated measles vaccine or an unknown type of measles vaccine during 1963-1967 should receive 2 doses of MMR vaccine. People who received inactivated mumps vaccine or an unknown type of mumps vaccine before 1979 and are at high risk for mumps infection should consider immunization with 2 doses of MMR vaccine. For females of childbearing age, rubella immunity should be determined. If there is no evidence of immunity, females who are not pregnant should be vaccinated. If there is no evidence of immunity, females who are pregnant should delay immunization until after pregnancy. Unvaccinated health care workers born before [birth year] who lack laboratory evidence of measles, mumps, or rubella immunity or laboratory confirmation of disease should consider measles and mumps immunization with 2 doses of MMR vaccine or rubella immunization with 1 dose of MMR vaccine.  Pneumococcal 13-valent conjugate (PCV13) vaccine. When indicated, a person who is uncertain of her immunization history and has no record of immunization should receive the PCV13 vaccine. An adult aged 38 years or older who has certain medical conditions and has not been previously immunized should receive 1 dose of PCV13 vaccine. This PCV13 should be followed with a dose of pneumococcal polysaccharide (PPSV23) vaccine. The PPSV23 vaccine dose should be obtained at least 8 weeks after the dose of PCV13 vaccine. An adult aged 34 years or older who has certain medical conditions and previously received 1 or more doses of PPSV23 vaccine should receive 1 dose of PCV13. The PCV13 vaccine dose should be obtained 1 or more years after the last PPSV23 vaccine dose.  Pneumococcal polysaccharide (PPSV23) vaccine. When PCV13 is also indicated, PCV13 should be obtained first. All adults aged 11 years and older should be immunized. An adult younger than age  27 years who has certain medical conditions should be immunized. Any person who resides in  a nursing home or long-term care facility should be immunized. An adult smoker should be immunized. People with an immunocompromised condition and certain other conditions should receive both PCV13 and PPSV23 vaccines. People with human immunodeficiency virus (HIV) infection should be immunized as soon as possible after diagnosis. Immunization during chemotherapy or radiation therapy should be avoided. Routine use of PPSV23 vaccine is not recommended for American Indians, [place] Natives, or people younger than 65 years unless there are medical conditions that require PPSV23 vaccine. When indicated, people who have unknown immunization and have no record of immunization should receive PPSV23 vaccine. One-time revaccination 5 years after the first dose of PPSV23 is recommended for people aged 19-64 years who have chronic kidney failure, nephrotic syndrome, asplenia, or immunocompromised conditions. People who received 1-2 doses of PPSV23 before age 2 years should receive another dose of PPSV23 vaccine at age 48 years or later if at least 5 years have passed since the previous dose. Doses of PPSV23 are not needed for people immunized with PPSV23 at or after age 55 years.  Meningococcal vaccine. Adults with asplenia or persistent complement component deficiencies should receive 2 doses of quadrivalent meningococcal conjugate (MenACWY-D) vaccine. The doses should be obtained at least 2 months apart. Microbiologists working with certain meningococcal bacteria, [employer] recruits, people at risk during an outbreak, and people who travel to or live in countries with a high rate of meningitis should be immunized. A first-year college student up through age 39 years who is living in a residence hall should receive a dose if she did not receive a dose on or after her 16th birthday. Adults who have certain high-risk conditions should  receive one or more doses of vaccine.  Hepatitis A vaccine. Adults who wish to be protected from this disease, have certain high-risk conditions, work with hepatitis A-infected animals, work in hepatitis A research labs, or travel to or work in countries with a high rate of hepatitis A should be immunized. Adults who were previously unvaccinated and who anticipate close contact with an international adoptee during the first 60 days after arrival in the [place] from a country with a high rate of hepatitis A should be immunized.  Hepatitis B vaccine. Adults who wish to be protected from this disease, have certain high-risk conditions, may be exposed to blood or other infectious body fluids, are household contacts or sex partners of hepatitis B positive people, are clients or workers in certain care facilities, or travel to or work in countries with a high rate of hepatitis B should be immunized.  Haemophilus influenzae type b (Hib) vaccine. A previously unvaccinated person with asplenia or sickle cell disease or having a scheduled splenectomy should receive 1 dose of Hib vaccine. Regardless of previous immunization, a recipient of a hematopoietic stem cell transplant should receive a 3-dose series 6-12 months after her successful transplant. Hib vaccine is not recommended for adults with HIV infection. Preventive Services / Frequency Ages 21 to 39years  Blood pressure check.** / Every 1 to 2 years.  Lipid and cholesterol check.** / Every 5 years beginning at age 76.  Clinical breast exam.** / Every 3 years for women in their 46s and 26s.  BRCA-related cancer risk assessment.** / For women who have family members with a BRCA-related cancer (breast, ovarian, tubal, or peritoneal cancers).  Pap test.** / Every 2 years from ages 16 through 28. Every 3 years starting at age 24 through age 38 or 2 with a history of 3 consecutive normal  Pap tests.  HPV screening.** / Every 3 years from ages 88  through ages 72 to 38 with a history of 3 consecutive normal Pap tests.  Hepatitis C blood test.** / For any individual with known risks for hepatitis C.  Skin self-exam. / Monthly.  Influenza vaccine. / Every year.  Tetanus, diphtheria, and acellular pertussis (Tdap, Td) vaccine.** / Consult your health care provider. Pregnant women should receive 1 dose of Tdap vaccine during each pregnancy. 1 dose of Td every 10 years.  Varicella vaccine.** / Consult your health care provider. Pregnant females who do not have evidence of immunity should receive the first dose after pregnancy.  HPV vaccine. / 3 doses over 6 months, if 8 and younger. The vaccine is not recommended for use in pregnant females. However, pregnancy testing is not needed before receiving a dose.  Measles, mumps, rubella (MMR) vaccine.** / You need at least 1 dose of MMR if you were born in 1957 or later. You may also need a 2nd dose. For females of childbearing age, rubella immunity should be determined. If there is no evidence of immunity, females who are not pregnant should be vaccinated. If there is no evidence of immunity, females who are pregnant should delay immunization until after pregnancy.  Pneumococcal 13-valent conjugate (PCV13) vaccine.** / Consult your health care provider.  Pneumococcal polysaccharide (PPSV23) vaccine.** / 1 to 2 doses if you smoke cigarettes or if you have certain conditions.  Meningococcal vaccine.** / 1 dose if you are age 45 to 29 years and a Market researcher living in a residence hall, or have one of several medical conditions, you need to get vaccinated against meningococcal disease. You may also need additional booster doses.  Hepatitis A vaccine.** / Consult your health care provider.  Hepatitis B vaccine.** / Consult your health care provider.  Haemophilus influenzae type b (Hib) vaccine.** / Consult your health care provider. Ages 37 to 64years  Blood pressure check.** /  Every 1 to 2 years.  Lipid and cholesterol check.** / Every 5 years beginning at age 56 years.  Lung cancer screening. / Every year if you are aged 4-80 years and have a 30-pack-year history of smoking and currently smoke or have quit within the past 15 years. Yearly screening is stopped once you have quit smoking for at least 15 years or develop a health problem that would prevent you from having lung cancer treatment.  Clinical breast exam.** / Every year after age 57 years.  BRCA-related cancer risk assessment.** / For women who have family members with a BRCA-related cancer (breast, ovarian, tubal, or peritoneal cancers).  Mammogram.** / Every year beginning at age 55 years and continuing for as long as you are in good health. Consult with your health care provider.  Pap test.** / Every 3 years starting at age 91 years through age 7 or 79 years with a history of 3 consecutive normal Pap tests.  HPV screening.** / Every 3 years from ages 85 years through ages 24 to 70 years with a history of 3 consecutive normal Pap tests.  Fecal occult blood test (FOBT) of stool. / Every year beginning at age 61 years and continuing until age 49 years. You may not need to do this test if you get a colonoscopy every 10 years.  Flexible sigmoidoscopy or colonoscopy.** / Every 5 years for a flexible sigmoidoscopy or every 10 years for a colonoscopy beginning at age 56 years and continuing until age 40 years.  Hepatitis  C blood test.** / For all people born from 1945 through 1965 and any individual with known risks for hepatitis C.  Skin self-exam. / Monthly.  Influenza vaccine. / Every year.  Tetanus, diphtheria, and acellular pertussis (Tdap/Td) vaccine.** / Consult your health care provider. Pregnant women should receive 1 dose of Tdap vaccine during each pregnancy. 1 dose of Td every 10 years.  Varicella vaccine.** / Consult your health care provider. Pregnant females who do not have evidence of  immunity should receive the first dose after pregnancy.  Zoster vaccine.** / 1 dose for adults aged 60 years or older.  Measles, mumps, rubella (MMR) vaccine.** / You need at least 1 dose of MMR if you were born in 1957 or later. You may also need a 2nd dose. For females of childbearing age, rubella immunity should be determined. If there is no evidence of immunity, females who are not pregnant should be vaccinated. If there is no evidence of immunity, females who are pregnant should delay immunization until after pregnancy.  Pneumococcal 13-valent conjugate (PCV13) vaccine.** / Consult your health care provider.  Pneumococcal polysaccharide (PPSV23) vaccine.** / 1 to 2 doses if you smoke cigarettes or if you have certain conditions.  Meningococcal vaccine.** / Consult your health care provider.  Hepatitis A vaccine.** / Consult your health care provider.  Hepatitis B vaccine.** / Consult your health care provider.  Haemophilus influenzae type b (Hib) vaccine.** / Consult your health care provider. Ages 65 years and over  Blood pressure check.** / Every 1 to 2 years.  Lipid and cholesterol check.** / Every 5 years beginning at age 20 years.  Lung cancer screening. / Every year if you are aged 55-80 years and have a 30-pack-year history of smoking and currently smoke or have quit within the past 15 years. Yearly screening is stopped once you have quit smoking for at least 15 years or develop a health problem that would prevent you from having lung cancer treatment.  Clinical breast exam.** / Every year after age 40 years.  BRCA-related cancer risk assessment.** / For women who have family members with a BRCA-related cancer (breast, ovarian, tubal, or peritoneal cancers).  Mammogram.** / Every year beginning at age 40 years and continuing for as long as you are in good health. Consult with your health care provider.  Pap test.** / Every 3 years starting at age 30 years through age 65 or  70 years with 3 consecutive normal Pap tests. Testing can be stopped between 65 and 70 years with 3 consecutive normal Pap tests and no abnormal Pap or HPV tests in the past 10 years.  HPV screening.** / Every 3 years from ages 30 years through ages 65 or 70 years with a history of 3 consecutive normal Pap tests. Testing can be stopped between 65 and 70 years with 3 consecutive normal Pap tests and no abnormal Pap or HPV tests in the past 10 years.  Fecal occult blood test (FOBT) of stool. / Every year beginning at age 50 years and continuing until age 75 years. You may not need to do this test if you get a colonoscopy every 10 years.  Flexible sigmoidoscopy or colonoscopy.** / Every 5 years for a flexible sigmoidoscopy or every 10 years for a colonoscopy beginning at age 50 years and continuing until age 75 years.  Hepatitis C blood test.** / For all people born from 1945 through 1965 and any individual with known risks for hepatitis C.  Osteoporosis screening.** / A   one-time screening for women ages 53 years and over and women at risk for fractures or osteoporosis.  Skin self-exam. / Monthly.  Influenza vaccine. / Every year.  Tetanus, diphtheria, and acellular pertussis (Tdap/Td) vaccine.** / 1 dose of Td every 10 years.  Varicella vaccine.** / Consult your health care provider.  Zoster vaccine.** / 1 dose for adults aged 32 years or older.  Pneumococcal 13-valent conjugate (PCV13) vaccine.** / Consult your health care provider.  Pneumococcal polysaccharide (PPSV23) vaccine.** / 1 dose for all adults aged 47 years and older.  Meningococcal vaccine.** / Consult your health care provider.  Hepatitis A vaccine.** / Consult your health care provider.  Hepatitis B vaccine.** / Consult your health care provider.  Haemophilus influenzae type b (Hib) vaccine.** / Consult your health care provider. ** Family history and personal history of risk and conditions may change your health care  provider's recommendations. Document Released: 02/10/2002 Document Revised: 12/20/2013 Document Reviewed: 05/12/2011 [facility] Patient Information 2015 [place], [place]. This information is not intended to replace advice given to you by your health care provider. Make sure you discuss any questions you have with your health care provider.

## 2014-07-17 NOTE — Progress Notes (Signed)
    GYNECOLOGY CLINIC ANNUAL PREVENTATIVE CARE ENCOUNTER NOTE  Subjective:     Lori Mays is a 28 y.o. G0P0000 female here for a routine annual gynecologic exam.  Current complaints: Bilateral breast pain for past 2-3 days. Patient denies skin changes on breasts, erythema, nipple discharge and breast masses.  Reports that she is doing well with Depo-Provera and would like to continue using this form of birth control. She is amenorrheic and reports that this is controlling her endometriosis. She has no other complaints.   Gynecologic History No LMP recorded. Patient has had an injection. Contraception: Depo-Provera injections Last Pap: 06/13/2013. Results were: Abnormal. ASC-US with high risk HPV  Obstetric History OB History  Gravida Para Term Preterm AB SAB TAB Ectopic Multiple Living  0 0 0 0 0 0 0 0 0 0         The following portions of the patient's history were reviewed and updated as appropriate: allergies, current medications, past family history, past medical history, past social history, past surgical history and problem list.  Review of Systems Pertinent items are noted in HPI.    Objective:   BP 130/69  Pulse 69  Temp(Src) 99 F (37.2 C) (Oral)  Ht 5\' 8"  (1.727 m)  Wt 132 lb 4.8 oz (60.011 kg)  BMI 20.12 kg/m2 GENERAL: Well-developed, well-nourished female in no acute distress.  HEENT: Normocephalic, atraumatic.   LUNGS: Clear to auscultation bilaterally.  HEART: Regular rate and rhythm. BREASTS: Symmetric in size. No masses, skin changes, nipple drainage, or lymphadenopathy. ABDOMEN: Soft, nontender, nondistended. No organomegaly. PELVIC: Normal external female genitalia. Vagina is pink and rugated.  Normal discharge. Normal cervix contour. Pap smear obtained. Uterus is normal in size. No adnexal mass, mild tenderness to palpation bilaterally.  EXTREMITIES: No cyanosis, clubbing. Trace edema of lower extremities bilaterally.   Assessment:   Annual  gynecologic examination   Plan:   Pap done, will follow up results and manage accordingly. Normal breast exam, patient reassured.  Tenderness likely due to hormonal fluctuations. Routine preventative health maintenance measures emphasized.   Haywood FillerAlyssa Sanders PA-S   Attestation of Attending Supervision of Physician Assistant Student: Evaluation and management procedures were performed by the PA Student under my supervision.  I have seen and examined the patient, reviewed the the student's note and chart, and I agree with management and plan.  Jaynie CollinsUGONNA  Tamberlyn Midgley, MD, FACOG Attending Obstetrician & Gynecologist Faculty Practice, [facility] [place]

## 2014-07-19 LAB — CYTOLOGY - PAP

## 2014-08-08 ENCOUNTER — Telehealth: Payer: Self-pay | Admitting: *Deleted

## 2014-08-08 DIAGNOSIS — M2550 Pain in unspecified joint: Secondary | ICD-10-CM

## 2014-08-08 DIAGNOSIS — N809 Endometriosis, unspecified: Secondary | ICD-10-CM

## 2014-08-08 MED ORDER — NAPROXEN 500 MG PO TABS: 500.0000 mg | ORAL_TABLET | Freq: Two times a day (BID) | ORAL | Status: DC | PRN

## 2014-08-08 NOTE — Telephone Encounter (Signed)
Patient returned call and stated she is experiencing pelvic pain r/t endometriosis and would like a RX for naproxen as this is helped in the past. Consulted Dr. Jolayne Pantheronstant who agreed to order Naproxen 500mg . Medication e-prescribed to patient's pharmacy. Called patient and informed her med is at pharmacy. Patient verbalized understanding and gratitude. No further questions or concerns.

## 2014-08-08 NOTE — Telephone Encounter (Signed)
Pt left message stating that she has endometriosis and is receiving Depo for this reason. She is having a period and pelvic pain. Please call back. I called pt and left message to call us back and state whether a detailed message may be left on her voice mail. We can provide medical advice in our message with her permission.

## 2014-08-30 ENCOUNTER — Ambulatory Visit (INDEPENDENT_AMBULATORY_CARE_PROVIDER_SITE_OTHER): Payer: Self-pay | Admitting: *Deleted

## 2014-08-30 ENCOUNTER — Telehealth: Payer: Self-pay | Admitting: *Deleted

## 2014-08-30 ENCOUNTER — Encounter: Payer: Self-pay | Admitting: *Deleted

## 2014-08-30 VITALS — BP 121/72 | HR 61 | Temp 98.3°F | Wt 127.9 lb

## 2014-08-30 DIAGNOSIS — Z30013 Encounter for initial prescription of injectable contraceptive: Secondary | ICD-10-CM

## 2014-08-30 DIAGNOSIS — Z3009 Encounter for other general counseling and advice on contraception: Secondary | ICD-10-CM

## 2014-08-30 DIAGNOSIS — Z202 Contact with and (suspected) exposure to infections with a predominantly sexual mode of transmission: Secondary | ICD-10-CM

## 2014-08-30 MED ORDER — MEDROXYPROGESTERONE ACETATE 104 MG/0.65ML ~~LOC~~ SUSP
104.0000 mg | Freq: Once | SUBCUTANEOUS | Status: AC
  Administered 2014-08-30: 104 mg via SUBCUTANEOUS

## 2014-08-30 NOTE — Telephone Encounter (Signed)
Pt left message requesting to have blood work done at her appt today for Depo injection. Please call back

## 2014-08-31 LAB — HIV ANTIBODY (ROUTINE TESTING W REFLEX): HIV 1&2 Ab, 4th Generation: NONREACTIVE

## 2014-08-31 NOTE — Telephone Encounter (Signed)
Called patient back and she states she got her questions answered yesterday when she was here. Patient had no questions at this time

## 2014-09-06 ENCOUNTER — Telehealth: Payer: Self-pay | Admitting: *Deleted

## 2014-09-06 NOTE — Telephone Encounter (Addendum)
Pt left message stating that she is returning a call from our office. A detailed message can be left on her voice mail. Per chart review, no record of pt being called within the last few days by our staff.

## 2014-09-07 NOTE — Telephone Encounter (Signed)
Called patient back stating I am returning her phone call and I cannot see in our system where one of [place] has tried to contact her. Patient verbalized understanding and asked about recent blood work. Told patient her labs were negative.  Patient verbalized understanding and had no other questions

## 2014-11-22 ENCOUNTER — Ambulatory Visit: Payer: Self-pay

## 2014-12-11 ENCOUNTER — Ambulatory Visit (INDEPENDENT_AMBULATORY_CARE_PROVIDER_SITE_OTHER): Payer: Self-pay | Admitting: *Deleted

## 2014-12-11 VITALS — BP 116/64 | HR 68

## 2014-12-11 DIAGNOSIS — L659 Nonscarring hair loss, unspecified: Secondary | ICD-10-CM

## 2014-12-11 DIAGNOSIS — Z3049 Encounter for surveillance of other contraceptives: Secondary | ICD-10-CM

## 2014-12-11 DIAGNOSIS — Z3042 Encounter for surveillance of injectable contraceptive: Secondary | ICD-10-CM

## 2014-12-11 LAB — TSH: TSH: 0.667 u[IU]/mL (ref 0.350–4.500)

## 2014-12-11 LAB — POCT PREGNANCY, URINE: Preg Test, Ur: NEGATIVE

## 2014-12-11 MED ORDER — MEDROXYPROGESTERONE ACETATE 104 MG/0.65ML ~~LOC~~ SUSP
104.0000 mg | Freq: Once | SUBCUTANEOUS | Status: AC
  Administered 2014-12-11: 104 mg via SUBCUTANEOUS

## 2014-12-11 NOTE — Progress Notes (Signed)
Here for depoprovera . Pt. Aware she is 5 days late. [place] She denies intercourse last 2 weeks. Upt done, negative - depoprovera given.  Also reports hair loss- we discussed could be a side effect of depoprovera. Discussed with Dr. Debroah LoopArnold- could also by thyroid related, or just genetics. Will order tsh to rule out thyroid cause. Also reports she thinks she may have another ovarian cyst. Discussed with Dr. Debroah LoopArnold, patient to make appointment to be seen by provider first available appointment which may not be for several weeks, come to [facility] if severe pain in  The meantime. Lori Mays voices understanding

## 2015-01-18 ENCOUNTER — Ambulatory Visit: Payer: Self-pay | Admitting: Family Medicine

## 2015-02-09 ENCOUNTER — Ambulatory Visit: Payer: Self-pay | Attending: Internal Medicine | Admitting: Internal Medicine

## 2015-02-09 ENCOUNTER — Encounter: Payer: Self-pay | Admitting: Internal Medicine

## 2015-02-09 VITALS — BP 112/75 | HR 74 | Temp 98.2°F | Resp 16 | Ht 67.0 in | Wt 139.0 lb

## 2015-02-09 DIAGNOSIS — Z139 Encounter for screening, unspecified: Secondary | ICD-10-CM

## 2015-02-09 DIAGNOSIS — J302 Other seasonal allergic rhinitis: Secondary | ICD-10-CM | POA: Insufficient documentation

## 2015-02-09 DIAGNOSIS — Z791 Long term (current) use of non-steroidal anti-inflammatories (NSAID): Secondary | ICD-10-CM | POA: Insufficient documentation

## 2015-02-09 DIAGNOSIS — J4520 Mild intermittent asthma, uncomplicated: Secondary | ICD-10-CM | POA: Insufficient documentation

## 2015-02-09 DIAGNOSIS — Z23 Encounter for immunization: Secondary | ICD-10-CM

## 2015-02-09 DIAGNOSIS — Z9109 Other allergy status, other than to drugs and biological substances: Secondary | ICD-10-CM

## 2015-02-09 DIAGNOSIS — H538 Other visual disturbances: Secondary | ICD-10-CM | POA: Insufficient documentation

## 2015-02-09 DIAGNOSIS — Z91048 Other nonmedicinal substance allergy status: Secondary | ICD-10-CM

## 2015-02-09 LAB — COMPLETE METABOLIC PANEL WITHOUT GFR
ALT: 13 U/L (ref 0–35)
AST: 12 U/L (ref 0–37)
Albumin: 4.3 g/dL (ref 3.5–5.2)
Alkaline Phosphatase: 55 U/L (ref 39–117)
BUN: 10 mg/dL (ref 6–23)
CO2: 24 meq/L (ref 19–32)
Calcium: 9.8 mg/dL (ref 8.4–10.5)
Chloride: 106 meq/L (ref 96–112)
Creat: 0.69 mg/dL (ref 0.50–1.10)
GFR, Est African American: >89 mL/min
GFR, Est Non African American: >89 mL/min
Glucose, Bld: 81 mg/dL (ref 70–99)
Potassium: 5 meq/L (ref 3.5–5.3)
Sodium: 140 meq/L (ref 135–145)
Total Bilirubin: 0.7 mg/dL (ref 0.2–1.2)
Total Protein: 7 g/dL (ref 6.0–8.3)

## 2015-02-09 LAB — TSH: TSH: 1.052 u[IU]/mL (ref 0.350–4.500)

## 2015-02-09 MED ORDER — ALBUTEROL SULFATE HFA 108 (90 BASE) MCG/ACT IN AERS: 2.0000 | INHALATION_SPRAY | Freq: Four times a day (QID) | RESPIRATORY_TRACT | Status: DC | PRN

## 2015-02-09 NOTE — Progress Notes (Signed)
Pt is here following up on her asthma. Pt reports having a pigment in her right eye that is spreading and sometimes painful.  Pt is getting over allergy's and a cold.

## 2015-02-09 NOTE — Progress Notes (Signed)
MRN: [id number] Name: Lori Mays  Sex: female Age: 29 y.o. DOB: Aug 10, 1986  Allergies: Diphenhydramine hcl and Latex  Chief Complaint  Patient presents with  . Establish Care    HPI: Patient is 29 y.o. female who history of asthma comes today for followup also has lot of environmental allergies as per patient she is taking Claritin, she also reported to have symptoms of blurry vision as per patient she used corrective glasses in the past, she's currently using albuterol when necessary, she does not use it on every day basis, she denies smoking cigarettes.   Past Medical History  Diagnosis Date  . Asthma   . Endometriosis   . Cyst 06/07/2012    Past Surgical History  Procedure Laterality Date  . Laproscopy  2 years ago      Medication List       This list is accurate as of: 02/09/15 11:25 AM.  Always use your most recent med list.               acetaminophen 500 MG tablet  Commonly known as:  TYLENOL  Take 1,000 mg by mouth every 6 (six) hours as needed for pain.     albuterol 108 (90 BASE) MCG/ACT inhaler  Commonly known as:  PROVENTIL HFA;VENTOLIN HFA  Inhale 2 puffs into the lungs every 6 (six) hours as needed for wheezing or shortness of breath. For asthma     calcium carbonate 600 MG Tabs tablet  Commonly known as:  OS-CAL  Take 600 mg by mouth 2 (two) times daily.     hpv vaccine injection  Commonly known as:  GARDASIL  Inject 0.5 mLs into the muscle once.     HYDROcodone-acetaminophen 5-325 MG per tablet  Commonly known as:  NORCO/VICODIN  Take 2 tablets by mouth every 4 (four) hours as needed for pain.     naproxen 500 MG tablet  Commonly known as:  NAPROSYN  Take 1 tablet (500 mg total) by mouth 2 (two) times daily as needed.     promethazine 25 MG tablet  Commonly known as:  PHENERGAN  Take 1 tablet (25 mg total) by mouth every 6 (six) hours as needed for nausea.        Meds ordered this encounter  Medications  . albuterol  (PROVENTIL HFA;VENTOLIN HFA) 108 (90 BASE) MCG/ACT inhaler    Sig: Inhale 2 puffs into the lungs every 6 (six) hours as needed for wheezing or shortness of breath. For asthma    Dispense:  1 Inhaler    Refill:  3    Immunization History  Administered Date(s) Administered  . HPV Quadrivalent 06/20/2013, 09/01/2013, 11/23/2013    Family History  Problem Relation Age of Onset  . Cancer Maternal Grandmother   . Cancer Paternal Grandmother     History  Substance Use Topics  . Smoking status: Never Smoker   . Smokeless tobacco: Never Used  . Alcohol Use: 0.6 - 1.2 oz/week    1-2 Glasses of wine per week    Review of Systems   As noted in HPI  Filed Vitals:   02/09/15 1050  BP: 112/75  Pulse: 74  Temp: 98.2 F (36.8 C)  Resp: 16    Physical Exam  Physical Exam  Constitutional: No distress.  Eyes: EOM are normal. Pupils are equal, round, and reactive to light.  Cardiovascular: Normal rate and regular rhythm.   Pulmonary/Chest: Breath sounds normal. No respiratory distress. She has no wheezes. She has  no rales.  Musculoskeletal: She exhibits no edema.    CBC    Component Value Date/Time   WBC 5.1 09/14/2009 0917   RBC 4.44 09/14/2009 0917   HGB 12.8 09/14/2009 0917   HCT 38.0 09/14/2009 0917   PLT 256 09/14/2009 0917   MCV 85.4 09/14/2009 0917   LYMPHSABS 1.3 06/19/2009 1447   MONOABS 0.4 06/19/2009 1447   EOSABS 0.1 06/19/2009 1447   BASOSABS 0.0 06/19/2009 1447    CMP     Component Value Date/Time   NA 140 05/08/2009 1649   K 3.7 05/08/2009 1649   CL 107 05/08/2009 1649   CO2 27 05/08/2009 1649   GLUCOSE 136* 05/08/2009 1649   BUN 9 05/08/2009 1649   CREATININE 0.79 05/08/2009 1649   CALCIUM 9.5 05/08/2009 1649   PROT 7.1 05/08/2009 1649   ALBUMIN 4.1 05/08/2009 1649   AST 20 05/08/2009 1649   ALT 15 05/08/2009 1649   ALKPHOS 55 05/08/2009 1649   BILITOT 0.6 05/08/2009 1649   GFRNONAA >60 05/08/2009 1649   GFRAA  05/08/2009 1649    >60         The eGFR has been calculated using the MDRD equation. This calculation has not been validated in all clinical situations. eGFR's persistently <60 mL/min signify possible Chronic Kidney Disease.    No results found for: CHOL  No components found for: HGA1C  Lab Results  Component Value Date/Time   AST 20 05/08/2009 04:49 PM    Assessment and Plan  Asthma, chronic, mild intermittent, uncomplicated - Plan: Symptoms are stable continue with albuterol (PROVENTIL HFA;VENTOLIN HFA) 108 (90 BASE) MCG/ACT inhaler  Blurry vision - Plan: Ambulatory referral to Ophthalmology  Environmental allergies Patient takes Claritin when necessary.  Screening - Plan: COMPLETE METABOLIC PANEL WITH GFR, TSH, Vit D  25 hydroxy (rtn osteoporosis monitoring)  Need for prophylactic vaccination against Streptococcus pneumoniae (pneumococcus)   Health Maintenance  -Vaccinations:  -patient  declines for flu shot pneumovax given today   Return in about 4 months (around 06/10/2015), or if symptoms worsen or fail to improve, for asthma.  Lorayne Marek, MD

## 2015-02-10 LAB — VITAMIN D 25 HYDROXY (VIT D DEFICIENCY, FRACTURES): Vit D, 25-Hydroxy: 15 ng/mL — ABNORMAL LOW (ref 30–100)

## 2015-02-22 ENCOUNTER — Telehealth: Payer: Self-pay

## 2015-02-22 ENCOUNTER — Other Ambulatory Visit: Payer: Self-pay

## 2015-02-22 MED ORDER — VITAMIN D (ERGOCALCIFEROL) 1.25 MG (50000 UNIT) PO CAPS: 50000.0000 [IU] | ORAL_CAPSULE | ORAL | Status: DC

## 2015-02-22 NOTE — Telephone Encounter (Signed)
Patient is aware of her lab results 

## 2015-02-22 NOTE — Telephone Encounter (Signed)
-----   Message from Doris Cheadleeepak Advani, MD sent at 02/12/2015 10:45 AM EST ----- Blood work reviewed, noticed low vitamin D, call patient advise to start ergocalciferol 50,000 units once a week for the duration of  12 weeks.

## 2015-03-01 ENCOUNTER — Ambulatory Visit: Payer: Self-pay

## 2015-03-05 ENCOUNTER — Ambulatory Visit: Payer: Self-pay

## 2015-03-26 ENCOUNTER — Ambulatory Visit: Payer: Self-pay

## 2015-04-24 ENCOUNTER — Ambulatory Visit: Payer: Self-pay

## 2015-05-21 ENCOUNTER — Ambulatory Visit: Payer: Self-pay

## 2015-06-11 ENCOUNTER — Ambulatory Visit (INDEPENDENT_AMBULATORY_CARE_PROVIDER_SITE_OTHER): Payer: Self-pay

## 2015-06-11 VITALS — BP 133/74 | HR 74

## 2015-06-11 DIAGNOSIS — Z3202 Encounter for pregnancy test, result negative: Secondary | ICD-10-CM

## 2015-06-11 DIAGNOSIS — N809 Endometriosis, unspecified: Secondary | ICD-10-CM

## 2015-06-11 DIAGNOSIS — Z01812 Encounter for preprocedural laboratory examination: Secondary | ICD-10-CM

## 2015-06-11 LAB — POCT PREGNANCY, URINE: Preg Test, Ur: NEGATIVE

## 2015-06-11 MED ORDER — MEDROXYPROGESTERONE ACETATE 150 MG/ML IM SUSP
150.0000 mg | Freq: Once | INTRAMUSCULAR | Status: AC
  Administered 2015-06-11: 150 mg via INTRAMUSCULAR

## 2015-06-11 NOTE — Progress Notes (Signed)
Patient here today for depo provera injection-- late for injection. Denies any intercourse in the last two weeks. UPT obtained and negative. Depo 150mg  administered into RUO quadrant of buttocks. Patient tolerated well. Denies any questions or concerns.

## 2015-06-15 ENCOUNTER — Ambulatory Visit: Payer: Self-pay

## 2015-07-16 ENCOUNTER — Ambulatory Visit: Payer: Self-pay

## 2015-08-17 ENCOUNTER — Ambulatory Visit: Payer: Self-pay

## 2015-08-23 ENCOUNTER — Ambulatory Visit: Payer: Self-pay | Attending: Family Medicine

## 2015-08-27 ENCOUNTER — Ambulatory Visit: Payer: Self-pay

## 2015-09-06 ENCOUNTER — Ambulatory Visit (INDEPENDENT_AMBULATORY_CARE_PROVIDER_SITE_OTHER): Payer: Self-pay | Admitting: *Deleted

## 2015-09-06 DIAGNOSIS — Z3042 Encounter for surveillance of injectable contraceptive: Secondary | ICD-10-CM

## 2015-09-06 DIAGNOSIS — Z3202 Encounter for pregnancy test, result negative: Secondary | ICD-10-CM

## 2015-09-06 LAB — POCT PREGNANCY, URINE: PREG TEST UR: NEGATIVE

## 2015-09-06 MED ORDER — MEDROXYPROGESTERONE ACETATE 150 MG/ML IM SUSP
150.0000 mg | Freq: Once | INTRAMUSCULAR | Status: AC
  Administered 2015-09-06: 150 mg via INTRAMUSCULAR

## 2015-11-26 ENCOUNTER — Ambulatory Visit (INDEPENDENT_AMBULATORY_CARE_PROVIDER_SITE_OTHER): Payer: Self-pay | Admitting: General Practice

## 2015-11-26 VITALS — BP 126/69 | HR 78 | Temp 98.6°F | Ht 67.0 in | Wt 142.0 lb

## 2015-11-26 DIAGNOSIS — N938 Other specified abnormal uterine and vaginal bleeding: Secondary | ICD-10-CM

## 2015-11-26 DIAGNOSIS — Z3042 Encounter for surveillance of injectable contraceptive: Secondary | ICD-10-CM

## 2016-02-25 ENCOUNTER — Ambulatory Visit (INDEPENDENT_AMBULATORY_CARE_PROVIDER_SITE_OTHER): Payer: Self-pay | Admitting: General Practice

## 2016-02-25 VITALS — BP 122/73 | HR 69 | Temp 98.4°F | Ht 68.0 in | Wt 142.0 lb

## 2016-02-25 DIAGNOSIS — N938 Other specified abnormal uterine and vaginal bleeding: Secondary | ICD-10-CM

## 2016-02-25 DIAGNOSIS — N809 Endometriosis, unspecified: Secondary | ICD-10-CM

## 2016-05-12 ENCOUNTER — Ambulatory Visit: Payer: Self-pay

## 2016-07-09 ENCOUNTER — Ambulatory Visit (INDEPENDENT_AMBULATORY_CARE_PROVIDER_SITE_OTHER): Payer: Self-pay

## 2016-07-09 VITALS — BP 126/74 | HR 72

## 2016-07-09 DIAGNOSIS — N938 Other specified abnormal uterine and vaginal bleeding: Secondary | ICD-10-CM

## 2016-07-09 DIAGNOSIS — Z3202 Encounter for pregnancy test, result negative: Secondary | ICD-10-CM

## 2016-07-09 DIAGNOSIS — Z3042 Encounter for surveillance of injectable contraceptive: Secondary | ICD-10-CM

## 2016-07-09 LAB — POCT PREGNANCY, URINE: PREG TEST UR: NEGATIVE

## 2016-09-24 ENCOUNTER — Ambulatory Visit: Payer: Self-pay

## 2016-12-17 ENCOUNTER — Emergency Department (HOSPITAL_COMMUNITY)
Admission: EM | Admit: 2016-12-17 | Discharge: 2016-12-17 | Disposition: A | Discharge status: Home / Self Care | Payer: Self-pay | Attending: Emergency Medicine | Admitting: Emergency Medicine

## 2016-12-17 DIAGNOSIS — R55 Syncope and collapse: Secondary | ICD-10-CM | POA: Insufficient documentation

## 2016-12-17 DIAGNOSIS — Z79899 Other long term (current) drug therapy: Secondary | ICD-10-CM | POA: Insufficient documentation

## 2016-12-17 DIAGNOSIS — J45909 Unspecified asthma, uncomplicated: Secondary | ICD-10-CM | POA: Insufficient documentation

## 2016-12-17 DIAGNOSIS — Z91040 Latex allergy status: Secondary | ICD-10-CM | POA: Insufficient documentation

## 2016-12-17 LAB — COMPREHENSIVE METABOLIC PANEL
ALBUMIN: 4.1 g/dL (ref 3.5–5.0)
ALK PHOS: 60 U/L (ref 38–126)
ALT: 71 U/L — ABNORMAL HIGH (ref 14–54)
ANION GAP: 6 (ref 5–15)
AST: 39 U/L (ref 15–41)
BUN: 5 mg/dL — ABNORMAL LOW (ref 6–20)
CHLORIDE: 107 mmol/L (ref 101–111)
CO2: 26 mmol/L (ref 22–32)
Calcium: 9.3 mg/dL (ref 8.9–10.3)
Creatinine, Ser: 0.73 mg/dL (ref 0.44–1.00)
GFR calc Af Amer: >60 mL/min (ref >60)
GFR calc non Af Amer: >60 mL/min (ref >60)
GLUCOSE: 105 mg/dL — AB (ref 65–99)
POTASSIUM: 4.2 mmol/L (ref 3.5–5.1)
SODIUM: 139 mmol/L (ref 135–145)
Total Bilirubin: 0.9 mg/dL (ref 0.3–1.2)
Total Protein: 7.2 g/dL (ref 6.5–8.1)

## 2016-12-17 LAB — URINALYSIS, ROUTINE W REFLEX MICROSCOPIC
BILIRUBIN URINE: NEGATIVE
Glucose, UA: NEGATIVE mg/dL
Hgb urine dipstick: NEGATIVE
Ketones, ur: 5 mg/dL — AB
Leukocytes, UA: NEGATIVE
Nitrite: NEGATIVE
PH: 7 (ref 5.0–8.0)
Protein, ur: NEGATIVE mg/dL
SPECIFIC GRAVITY, URINE: 1.011 (ref 1.005–1.030)

## 2016-12-17 LAB — CBC
HCT: 38.7 % (ref 36.0–46.0)
HEMOGLOBIN: 13.5 g/dL (ref 12.0–15.0)
MCH: 28.5 pg (ref 26.0–34.0)
MCHC: 34.9 g/dL (ref 30.0–36.0)
MCV: 81.8 fL (ref 78.0–100.0)
Platelets: 274 10*3/uL (ref 150–400)
RBC: 4.73 MIL/uL (ref 3.87–5.11)
RDW: 12.9 % (ref 11.5–15.5)
WBC: 3.8 10*3/uL — ABNORMAL LOW (ref 4.0–10.5)

## 2016-12-17 LAB — CBG MONITORING, ED: Glucose-Capillary: 90 mg/dL (ref 65–99)

## 2016-12-17 LAB — I-STAT BETA HCG BLOOD, ED (MC, WL, AP ONLY): I-stat hCG, quantitative: <5 m[IU]/mL (ref <5)

## 2016-12-17 LAB — LIPASE, BLOOD: LIPASE: 24 U/L (ref 11–51)

## 2016-12-17 MED ORDER — SODIUM CHLORIDE 0.9 % IV SOLN: INTRAVENOUS | Status: DC

## 2016-12-17 MED ORDER — SODIUM CHLORIDE 0.9 % IV BOLUS (SEPSIS)
2000.0000 mL | Freq: Once | INTRAVENOUS | Status: AC
  Administered 2016-12-17: 2000 mL via INTRAVENOUS

## 2016-12-17 NOTE — ED Provider Notes (Signed)
[facility]-EMERGENCY DEPT Provider Note   CSN: [id number] Arrival date & time: 12/17/16  1337     History   Chief Complaint Chief Complaint  Patient presents with  . Abdominal Pain  . Near Syncope    HPI Lori Mays is a 30 y.o. female.  30 year old female that a sudden onset of dizziness and near syncope while in the shower. Patient states that she was in a shop for possibly 5-10 minutes when she became dizzy and lightheaded and when she have a shower she says that she almost passed out. Denied any associated chest pain or chest pressure. No recent changes to medications. No recent vomiting or diarrhea or fever. She developed some lower abdominal cramping afterwards which has since resolved. She does have a history of endometriosis and has been weaning off of her Depo-Provera. States that she feels back to her baseline at this time. She admits that she did not have any food to eat this morning and usually eats when she first gets up.      Past Medical History:  Diagnosis Date  . Asthma   . Cyst 06/07/2012  . Endometriosis     Patient Active Problem List   Diagnosis Date Noted  . Asthma, chronic 06/22/2013  . Migraine, unspecified 06/22/2013  . ASCUS with positive high risk HPV 06/16/2013  . Arthralgia 06/13/2013  . Endometriosis 06/13/2013  . Edema of both legs 06/13/2013  . Weight gain 06/13/2013  . Cyst 06/07/2012    Past Surgical History:  Procedure Laterality Date  . laproscopy  2 years ago    OB History    Gravida Para Term Preterm AB Living   0 0 0 0 0 0   SAB TAB Ectopic Multiple Live Births   0 0 0 0         Home Medications    Prior to Admission medications   Medication Sig Start Date End Date Taking? Authorizing Provider  acetaminophen (TYLENOL) 500 MG tablet Take 1,000 mg by mouth every 6 (six) hours as needed for pain.   Yes Historical Provider, MD  albuterol (PROVENTIL HFA;VENTOLIN HFA) 108 (90 BASE) MCG/ACT inhaler Inhale 2 puffs into the  lungs every 6 (six) hours as needed for wheezing or shortness of breath. For asthma 02/09/15  Yes Doris Cheadleeepak Advani, MD  calcium carbonate (OS-CAL) 600 MG TABS Take 600 mg by mouth 2 (two) times daily.      Historical Provider, MD  hpv vaccine (GARDASIL) injection Inject 0.5 mLs into the muscle once. Patient not taking: Reported on 12/17/2016 06/20/13   Allie BossierMyra C Dove, MD  HYDROcodone-acetaminophen (NORCO/VICODIN) 5-325 MG per tablet Take 2 tablets by mouth every 4 (four) hours as needed for pain. Patient not taking: Reported on 12/17/2016 04/19/13   Junious SilkHannah Merrell, PA-C  naproxen (NAPROSYN) 500 MG tablet Take 1 tablet (500 mg total) by mouth 2 (two) times daily as needed. Patient not taking: Reported on 12/17/2016 08/08/14   Catalina AntiguaPeggy Constant, MD  promethazine (PHENERGAN) 25 MG tablet Take 1 tablet (25 mg total) by mouth every 6 (six) hours as needed for nausea. Patient not taking: Reported on 12/17/2016 04/19/13   Junious SilkHannah Merrell, PA-C  Vitamin D, Ergocalciferol, (DRISDOL) 50000 UNITS CAPS capsule Take 1 capsule (50,000 Units total) by mouth every 7 (seven) days. Patient not taking: Reported on 12/17/2016 02/22/15   Doris Cheadleeepak Advani, MD    Family History Family History  Problem Relation Age of Onset  . Cancer Maternal Grandmother   . Cancer Paternal Grandmother  Social History Social History  Substance Use Topics  . Smoking status: Never Smoker  . Smokeless tobacco: Never Used  . Alcohol use 0.6 - 1.2 oz/week    1 - 2 Glasses of wine per week     Allergies   Diphenhydramine hcl and Latex   Review of Systems Review of Systems  All other systems reviewed and are negative.    Physical Exam Updated Vital Signs BP 100/68 (BP Location: Left Arm)   Pulse 64   Temp 97.6 F (36.4 C) (Oral)   Resp 18   Ht 5\' 8"  (1.727 m)   Wt 69.9 kg   SpO2 100%   BMI 23.42 kg/m   Physical Exam  Constitutional: She is oriented to person, place, and time. She appears well-developed and well-nourished.   Non-toxic appearance. No distress.  HENT:  Head: Normocephalic and atraumatic.  Eyes: Conjunctivae, EOM and lids are normal. Pupils are equal, round, and reactive to light.  Neck: Normal range of motion. Neck supple. No tracheal deviation present. No thyroid mass present.  Cardiovascular: Normal rate, regular rhythm and normal heart sounds.  Exam reveals no gallop.   No murmur heard. Pulmonary/Chest: Effort normal and breath sounds normal. No stridor. No respiratory distress. She has no decreased breath sounds. She has no wheezes. She has no rhonchi. She has no rales.  Abdominal: Soft. Normal appearance and bowel sounds are normal. She exhibits no distension. There is no tenderness. There is no rebound and no CVA tenderness.  Musculoskeletal: Normal range of motion. She exhibits no edema or tenderness.  Neurological: She is alert and oriented to person, place, and time. She has normal strength. No cranial nerve deficit or sensory deficit. GCS eye subscore is 4. GCS verbal subscore is 5. GCS motor subscore is 6.  Skin: Skin is warm and dry. No abrasion and no rash noted.  Psychiatric: She has a normal mood and affect. Her speech is normal and behavior is normal.  Nursing note and vitals reviewed.    ED Treatments / Results  Labs (all labs ordered are listed, but only abnormal results are displayed) Labs Reviewed  COMPREHENSIVE METABOLIC PANEL - Abnormal; Notable for the following:       Result Value   Glucose, Bld 105 (*)    BUN 5 (*)    ALT 71 (*)    All other components within normal limits  CBC - Abnormal; Notable for the following:    WBC 3.8 (*)    All other components within normal limits  LIPASE, BLOOD  URINALYSIS, ROUTINE W REFLEX MICROSCOPIC  I-STAT BETA HCG BLOOD, ED ([facility], [facility], [facility] ONLY)  CBG MONITORING, ED    EKG  EKG Interpretation  Date/Time:  Wednesday December 17 2016 13:46:10 EST Ventricular Rate:  67 PR Interval:    QRS Duration: 84 QT Interval:  370 QTC  Calculation: 391 R Axis:   64 Text Interpretation:  Sinus rhythm Short PR interval Abnormal Q suggests anterior infarct Borderline T abnormalities, inferior leads Confirmed by Freida BusmanALLEN  MD, Vayden Weinand ([id number]) on 12/17/2016 4:04:28 PM       Radiology No results found.  Procedures Procedures (including critical care time)  Medications Ordered in ED Medications - No data to display   Initial Impression / Assessment and Plan / ED Course  I have reviewed the triage vital signs and the nursing notes.  Pertinent labs & imaging results that were available during my care of the patient were reviewed by me and considered in my  medical decision making (see chart for details).  Clinical Course     Patient IV fluids here. Likely vagal episode. She is not orthostatic. Stable for discharge  Final Clinical Impressions(s) / ED Diagnoses   Final diagnoses:  None    New Prescriptions New Prescriptions   No medications on file     Lorre Nick, MD 12/17/16 2017

## 2016-12-17 NOTE — ED Triage Notes (Signed)
Per EMS, patient complaining of abdominal pain/near syncope starting today. Denies vaginal bleeding, n/v/d Hx of endometriosis and ovarian cyst rupture.

## 2016-12-19 ENCOUNTER — Telehealth: Payer: Self-pay | Admitting: General Practice

## 2016-12-19 NOTE — Telephone Encounter (Signed)
Patient called and left message stating she needs a pap smear appt. Patient states she was in the hospital yesterday and her cyst burst and she wants to make sure nothing serious is wrong.

## 2016-12-19 NOTE — Telephone Encounter (Signed)
Called patient and she states she recently had a pain around her ovary that felt like a cyst bursting. Patient states she has a history of endometriosis and would like a pap smear to get checked out and make sure everything is okay & to get an ultrasound as well. Told patient we will bring her in for an annual exam for a pap smear and to evaluate any pain/issues. Told patient I cannot make appts but I will let the front office staff know she needs an appt and they will contact her to set that up. Patient verbalized understanding & had no questions

## 2017-09-03 ENCOUNTER — Ambulatory Visit: Payer: Self-pay | Admitting: Obstetrics & Gynecology

## 2017-09-03 ENCOUNTER — Ambulatory Visit (INDEPENDENT_AMBULATORY_CARE_PROVIDER_SITE_OTHER): Payer: Self-pay | Admitting: Obstetrics & Gynecology

## 2017-09-03 ENCOUNTER — Encounter: Payer: Self-pay | Admitting: *Deleted

## 2017-09-03 VITALS — BP 111/75 | HR 84 | Ht 68.0 in | Wt 150.5 lb

## 2017-09-03 DIAGNOSIS — Z01419 Encounter for gynecological examination (general) (routine) without abnormal findings: Secondary | ICD-10-CM

## 2017-09-03 MED ORDER — LEVONORGEST-ETH ESTRAD 91-DAY 0.15-0.03 &0.01 MG PO TABS: 1.0000 | ORAL_TABLET | Freq: Every day | ORAL | 4 refills | Status: DC

## 2017-09-03 NOTE — Progress Notes (Signed)
Subjective:    Lori Mays is a 31 y.o. S AA P0  female who presents for an annual exam. The patient has no complaints today. The patient is not currently sexually active. GYN screening history: last pap: was normal. The patient wears seatbelts: yes. The patient participates in regular exercise: yes. Has the patient ever been transfused or tattooed?: yes. The patient reports that there is not domestic violence in her life.   Menstrual History: OB History    Gravida Para Term Preterm AB Living   0 0 0 0 0 0   SAB TAB Ectopic Multiple Live Births   0 0 0 0        Menarche age: [age] No LMP recorded.    The following portions of the patient's history were reviewed and updated as appropriate: allergies, current medications, past family history, past medical history, past social history, past surgical history and problem list.  Review of Systems Pertinent items are noted in HPI.   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, a sister Bard Herbert(Vanessa Schurman) FH- + breast cancer in MGM and PGM No gyn/colon cancer, + pancreas cancer in m Great GM Has used depo in the past but currently uses condom prn Periods monthly, getting more painful   Objective:    BP 111/75   Pulse 84   Ht 5\' 8"  (1.727 m)   Wt 150 lb 8 oz (68.3 kg)   BMI 22.88 kg/m   General Appearance:    Alert, cooperative, no distress, appears stated age  Head:    Normocephalic, without obvious abnormality, atraumatic  Eyes:    PERRL, conjunctiva/corneas clear, EOM's intact, fundi    benign, both eyes  Ears:    Normal TM's and external ear canals, both ears  Nose:   Nares normal, septum midline, mucosa normal, no drainage    or sinus tenderness  Throat:   Lips, mucosa, and tongue normal; teeth and gums normal  Neck:   Supple, symmetrical, trachea midline, no adenopathy;    thyroid:  no enlargement/tenderness/nodules; no carotid   bruit or JVD  Back:     Symmetric, no curvature, ROM  normal, no CVA tenderness  Lungs:     Clear to auscultation bilaterally, respirations unlabored  Chest Wall:    No tenderness or deformity   Heart:    Regular rate and rhythm, S1 and S2 normal, no murmur, rub   or gallop  Breast Exam:    No tenderness, masses, or nipple abnormality  Abdomen:     Soft, non-tender, bowel sounds active all four quadrants,    no masses, no organomegaly  Genitalia:    Normal female without lesion, discharge or tenderness, NSSmidplane, mild tenderness with deep palpation, minimal mobility of uterus, no palpable adnexal masses     Extremities:   Extremities normal, atraumatic, no cyanosis or edema  Pulses:   2+ and symmetric all extremities  Skin:   Skin color, texture, turgor normal, no rashes or lesions  Lymph nodes:   Cervical, supraclavicular, and axillary nodes normal  Neurologic:   CNII-XII intact, normal strength, sensation and reflexes    throughout  .    Assessment:    Healthy female exam.   endometriosis   Plan:     Thin prep Pap smear. with cotesing Start Camrese for suppression of endometriosis

## 2017-09-03 NOTE — Progress Notes (Signed)
Pt no showed for annual exam. Per Dr. Marice Potterove patient does not need to be contacted, she can reschedule if she desires.

## 2017-09-08 LAB — CYTOLOGY - PAP
Diagnosis: NEGATIVE
HPV: DETECTED — AB

## 2017-10-06 ENCOUNTER — Telehealth: Payer: Self-pay | Admitting: General Practice

## 2017-10-06 NOTE — Telephone Encounter (Signed)
Called patient and informed her of results and recommendation. Patient verbalized understanding and asked if there is anything she needs to do. Told patient no, just a pap smear in one year. Patient verbalized understanding and had no questions

## 2017-10-06 NOTE — Telephone Encounter (Signed)
-----   Message from [name], MD sent at 10/05/2017  3:02 PM EDT ----- Please let her know that she needs a pap again next year. Thanks

## 2018-01-15 ENCOUNTER — Telehealth: Payer: Self-pay | Admitting: *Deleted

## 2018-01-15 NOTE — Telephone Encounter (Signed)
[name] called and left a message 01/13/18 am stating she thinks she has a yeast infection and she wants to make appointment with doctor.  I called Earnie and she reports she was having thick white cottage cheese discharge and she looked online and it said was yeast so she bought and used monistat one day. States it helped some but not all the way better and she wants to get a check. I informed her she could do a nurse visit for a self swab and check for 5 different things. She agreed to nurse visit 01/19/18.

## 2018-01-19 ENCOUNTER — Ambulatory Visit (INDEPENDENT_AMBULATORY_CARE_PROVIDER_SITE_OTHER): Payer: Self-pay

## 2018-01-19 DIAGNOSIS — N898 Other specified noninflammatory disorders of vagina: Secondary | ICD-10-CM

## 2018-01-19 DIAGNOSIS — Z113 Encounter for screening for infections with a predominantly sexual mode of transmission: Secondary | ICD-10-CM

## 2018-01-19 NOTE — Progress Notes (Addendum)
Patient states that she has endometriosis.Pt states that she had unprotected sex a month ago and is experiencing vaginal irritation. Pt self swabbed and had labs drawn for STD. Informed pt that she we will call her when results come in. Pt verbalized understanding.

## 2018-01-20 LAB — CERVICOVAGINAL ANCILLARY ONLY
Bacterial vaginitis: POSITIVE — AB
CHLAMYDIA, DNA PROBE: NEGATIVE
Candida vaginitis: POSITIVE — AB
Neisseria Gonorrhea: NEGATIVE
Trichomonas: NEGATIVE

## 2018-01-20 LAB — HIV ANTIBODY (ROUTINE TESTING W REFLEX): HIV SCREEN 4TH GENERATION: NONREACTIVE

## 2018-01-20 LAB — RPR: RPR: NONREACTIVE

## 2018-01-21 NOTE — Progress Notes (Signed)
I was available in clinic at the time of patient's visit. Agree with nursing note.   Krrish Freund P. Benicio Manna, MD OB Fellow  

## 2018-01-22 ENCOUNTER — Telehealth: Payer: Self-pay | Admitting: General Practice

## 2018-01-22 DIAGNOSIS — B9689 Other specified bacterial agents as the cause of diseases classified elsewhere: Secondary | ICD-10-CM

## 2018-01-22 DIAGNOSIS — N760 Acute vaginitis: Principal | ICD-10-CM

## 2018-01-22 DIAGNOSIS — B379 Candidiasis, unspecified: Secondary | ICD-10-CM

## 2018-01-22 MED ORDER — FLUCONAZOLE 150 MG PO TABS: 150.0000 mg | ORAL_TABLET | Freq: Once | ORAL | 0 refills | Status: AC

## 2018-01-22 MED ORDER — METRONIDAZOLE 500 MG PO TABS: 500.0000 mg | ORAL_TABLET | Freq: Two times a day (BID) | ORAL | 0 refills | Status: DC

## 2018-01-22 NOTE — Telephone Encounter (Signed)
Patient called and left message requesting test results. Called patient & informed her of yeast & BV as well as meds sent to pharmacy and instructions. Patient verbalized understanding to all & had no questions

## 2018-01-29 ENCOUNTER — Telehealth: Payer: Self-pay | Admitting: *Deleted

## 2018-01-29 NOTE — Telephone Encounter (Signed)
Patient called to ask about a prescription and whether she should start her other medication.

## 2018-08-17 ENCOUNTER — Ambulatory Visit: Payer: Self-pay

## 2018-08-17 DIAGNOSIS — R399 Unspecified symptoms and signs involving the genitourinary system: Secondary | ICD-10-CM

## 2018-08-17 DIAGNOSIS — Z3202 Encounter for pregnancy test, result negative: Secondary | ICD-10-CM

## 2018-08-17 LAB — POCT URINALYSIS DIP (DEVICE)
Bilirubin Urine: NEGATIVE
Glucose, UA: NEGATIVE mg/dL
Hgb urine dipstick: NEGATIVE
Ketones, ur: NEGATIVE mg/dL
Leukocytes, UA: NEGATIVE
NITRITE: NEGATIVE
PH: 6 (ref 5.0–8.0)
Protein, ur: NEGATIVE mg/dL
Specific Gravity, Urine: 1.015 (ref 1.005–1.030)
Urobilinogen, UA: 0.2 mg/dL (ref 0.0–1.0)

## 2018-08-17 LAB — POCT PREGNANCY, URINE: PREG TEST UR: NEGATIVE

## 2018-08-17 NOTE — Progress Notes (Signed)
Pt came for Pregnancy test and UTI check, Negative for both pregnancy test & UTI. Advised pt of OTC treatment she can purchase for mild symptoms. Pt verbalized understanding.

## 2018-08-17 NOTE — Progress Notes (Signed)
Chart reviewed for nurse visit. Agree with plan of care.   Kooistra, Kathryn Lorraine, CNM 08/17/2018 7:02 PM    

## 2018-11-17 ENCOUNTER — Ambulatory Visit (INDEPENDENT_AMBULATORY_CARE_PROVIDER_SITE_OTHER): Payer: Self-pay | Admitting: *Deleted

## 2018-11-17 DIAGNOSIS — N898 Other specified noninflammatory disorders of vagina: Secondary | ICD-10-CM

## 2018-11-17 DIAGNOSIS — B373 Candidiasis of vulva and vagina: Secondary | ICD-10-CM

## 2018-11-17 NOTE — Progress Notes (Signed)
Here for self swab. Thinks she has a yeast infection- was diagnosed once before. C/o vaginal irritation, and thick white discharge. Request self swab for yeast only.

## 2018-11-18 ENCOUNTER — Telehealth: Payer: Self-pay | Admitting: *Deleted

## 2018-11-18 LAB — CERVICOVAGINAL ANCILLARY ONLY: Candida vaginitis: POSITIVE — AB

## 2018-11-18 MED ORDER — FLUCONAZOLE 150 MG PO TABS: 150.0000 mg | ORAL_TABLET | Freq: Every day | ORAL | 2 refills | Status: DC

## 2018-11-18 NOTE — Addendum Note (Signed)
Addended by: Reva BoresPRATT, Rukaya Kleinschmidt S on: 11/18/2018 04:54 PM   Modules accepted: Orders

## 2018-11-18 NOTE — Telephone Encounter (Signed)
Called pt to inform her of yeast infection and that she had been prescribed Diflucan which was at the [facility] on [address].  Pt verified understanding.

## 2018-11-18 NOTE — Progress Notes (Signed)
Patient seen and assessed by nursing staff.  Agree with documentation and plan.  

## 2018-11-18 NOTE — Telephone Encounter (Signed)
-----   Message from Reva Boresanya S Pratt, MD sent at 11/18/2018  4:54 PM EST ----- Has yeast rx sent in--please inform

## 2019-01-03 ENCOUNTER — Emergency Department (EMERGENCY_DEPARTMENT_HOSPITAL)
Admit: 2019-01-03 | Discharge: 2019-01-03 | Disposition: A | Discharge status: Home / Self Care | Payer: Self-pay | Attending: Emergency Medicine | Admitting: Emergency Medicine

## 2019-01-03 ENCOUNTER — Encounter (HOSPITAL_COMMUNITY): Payer: Self-pay | Admitting: Emergency Medicine

## 2019-01-03 ENCOUNTER — Emergency Department (HOSPITAL_COMMUNITY)
Admission: EM | Admit: 2019-01-03 | Discharge: 2019-01-03 | Disposition: A | Discharge status: Home / Self Care | Payer: Self-pay | Attending: Emergency Medicine | Admitting: Emergency Medicine

## 2019-01-03 DIAGNOSIS — J45909 Unspecified asthma, uncomplicated: Secondary | ICD-10-CM | POA: Insufficient documentation

## 2019-01-03 DIAGNOSIS — I82492 Acute embolism and thrombosis of other specified deep vein of left lower extremity: Secondary | ICD-10-CM | POA: Insufficient documentation

## 2019-01-03 DIAGNOSIS — Z79899 Other long term (current) drug therapy: Secondary | ICD-10-CM | POA: Insufficient documentation

## 2019-01-03 DIAGNOSIS — M79605 Pain in left leg: Secondary | ICD-10-CM

## 2019-01-03 DIAGNOSIS — I82452 Acute embolism and thrombosis of left peroneal vein: Secondary | ICD-10-CM

## 2019-01-03 LAB — CBC
HCT: 40 % (ref 36.0–46.0)
HEMOGLOBIN: 12.8 g/dL (ref 12.0–15.0)
MCH: 28.1 pg (ref 26.0–34.0)
MCHC: 32 g/dL (ref 30.0–36.0)
MCV: 87.9 fL (ref 80.0–100.0)
Platelets: 250 10*3/uL (ref 150–400)
RBC: 4.55 MIL/uL (ref 3.87–5.11)
RDW: 13.1 % (ref 11.5–15.5)
WBC: 4.9 10*3/uL (ref 4.0–10.5)
nRBC: 0 % (ref 0.0–0.2)

## 2019-01-03 LAB — BASIC METABOLIC PANEL
ANION GAP: 6 (ref 5–15)
BUN: 9 mg/dL (ref 6–20)
CO2: 24 mmol/L (ref 22–32)
Calcium: 9.3 mg/dL (ref 8.9–10.3)
Chloride: 111 mmol/L (ref 98–111)
Creatinine, Ser: 0.72 mg/dL (ref 0.44–1.00)
GFR calc Af Amer: >60 mL/min (ref >60)
Glucose, Bld: 75 mg/dL (ref 70–99)
POTASSIUM: 4.2 mmol/L (ref 3.5–5.1)
SODIUM: 141 mmol/L (ref 135–145)

## 2019-01-03 LAB — PROTIME-INR
INR: 0.93
Prothrombin Time: 12.4 seconds (ref 11.4–15.2)

## 2019-01-03 LAB — I-STAT BETA HCG BLOOD, ED (MC, WL, AP ONLY): I-stat hCG, quantitative: <5 m[IU]/mL (ref <5)

## 2019-01-03 MED ORDER — ACETAMINOPHEN 325 MG PO TABS
650.0000 mg | ORAL_TABLET | Freq: Once | ORAL | Status: AC
  Administered 2019-01-03: 650 mg via ORAL
  Filled 2019-01-03: qty 2

## 2019-01-03 MED ORDER — RIVAROXABAN (XARELTO) VTE STARTER PACK (15 & 20 MG): ORAL_TABLET | ORAL | 0 refills | Status: DC

## 2019-01-03 MED ORDER — RIVAROXABAN 15 MG PO TABS
15.0000 mg | ORAL_TABLET | Freq: Two times a day (BID) | ORAL | Status: DC
  Administered 2019-01-03: 15 mg via ORAL
  Filled 2019-01-03: qty 1

## 2019-01-03 NOTE — Progress Notes (Signed)
Please see preliminary notes on CV PROC under chart review/ Veona Bittman H Revel Stellmach(RDMS RVT) 01/03/19 4:56 PM

## 2019-01-03 NOTE — ED Triage Notes (Signed)
Pt reports left leg pains that radiates from left buttock down to left foot. Reports that foot was discolored and reports some swelling. Tried elevating left last night. Pt denies fall or injuries.

## 2019-01-03 NOTE — Discharge Instructions (Signed)
Call the [facility] and [facility] and tell them you were seen here today and treated for a blood clot and need a follow up appointment. Return here as needed.

## 2019-01-03 NOTE — ED Provider Notes (Signed)
[facility]-EMERGENCY DEPT Provider Note   CSN: [id number] Arrival date & time: 01/03/19  1417     History   Chief Complaint Chief Complaint  Patient presents with  . Leg Pain    HPI Lori Mays is a 33 y.o. female who presents to the ED with left leg pain. The pain started yesterday. Patient reports that the pain radiates from the left buttock down the left leg. She states that she thinks yesterday initially the pain was in the left knee. Patient reports swelling to the left foot and felt like it was discolored last night. She elevated the leg but it continues to hurt. Patient denies any injury to the leg. Patient reports that in the past she has had shin pain but it always went away with rest and elevation. 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 She had not been doing any activity when this pain started.  The history is provided by the patient. No language interpreter was used.  Leg Pain  Location:  Leg Time since incident:  2 days Leg location:  L leg Pain details:    Quality:  Burning, aching, tingling and throbbing   Severity:  Moderate   Onset quality:  Gradual   Duration:  2 days   Timing:  Constant   Progression:  Worsening Chronicity:  New Prior injury to area:  No Relieved by:  Nothing Worsened by:  Bearing weight Ineffective treatments:  Acetaminophen Associated symptoms: back pain and tingling   Associated symptoms: no fever, no muscle weakness and no neck pain     Past Medical History:  Diagnosis Date  . Asthma   . Cyst 06/07/2012  . Endometriosis     Patient Active Problem List   Diagnosis Date Noted  . Asthma, chronic 06/22/2013  . Migraine, unspecified 06/22/2013  . ASCUS with positive high risk HPV 06/16/2013  . Arthralgia 06/13/2013  . Endometriosis 06/13/2013  . Edema of both legs 06/13/2013  . Weight gain 06/13/2013  . Cyst 06/07/2012    Past Surgical History:    Procedure Laterality Date  . laproscopy  2 years ago     OB History    Gravida  0   Para  0   Term  0   Preterm  0   AB  0   Living  0     SAB  0   TAB  0   Ectopic  0   Multiple  0   Live Births               Home Medications    Prior to Admission medications   Medication Sig Start Date End Date Taking? Authorizing Provider  acetaminophen (TYLENOL) 500 MG tablet Take 1,000 mg by mouth every 6 (six) hours as needed for pain.   Yes [provider]  albuterol (PROVENTIL HFA;VENTOLIN HFA) 108 (90 BASE) MCG/ACT inhaler Inhale 2 puffs into the lungs every 6 (six) hours as needed for wheezing or shortness of breath. For asthma 02/09/15  Yes Advani, Ayesha Rumpf, MD  hpv vaccine (GARDASIL) injection Inject 0.5 mLs into the muscle once. Patient not taking: Reported on 01/19/2018 06/20/13   [name], MD  Rivaroxaban 15 & 20 MG TBPK Take as directed on package: Start with one 15mg  tablet by mouth twice a day with food. On Day 22, switch to one 20mg  tablet once a day with food. 01/03/19   Kerrie Buffalo  M, NP  Vitamin D, Ergocalciferol, (DRISDOL) 50000 UNITS CAPS capsule Take 1 capsule (50,000 Units total) by mouth every 7 (seven) days. Patient not taking: Reported on 12/17/2016 02/22/15   Doris CheadleAdvani, Deepak, MD    Family History Family History  Problem Relation Age of Onset  . Cancer Maternal Grandmother   . Cancer Paternal Grandmother     Social History Social History   Tobacco Use  . Smoking status: Never Smoker  . Smokeless tobacco: Never Used  Substance Use Topics  . Alcohol use: Yes    Alcohol/week: 1.0 - 2.0 standard drinks    Types: 1 - 2 Glasses of wine per week  . Drug use: No     Allergies   Diphenhydramine hcl and Latex   Review of Systems Review of Systems  Constitutional: Negative for fever.  HENT: Negative.   Eyes: Negative for pain, discharge, itching and visual disturbance.  Respiratory: Negative for cough, shortness of breath and  wheezing.   Cardiovascular: Negative for chest pain.  Gastrointestinal: Negative for diarrhea, nausea and vomiting. Abdominal pain: cramping with menses.  Genitourinary: Negative for dysuria, frequency and urgency.  Musculoskeletal: Positive for arthralgias and back pain. Negative for neck pain.       Left calf and thigh pain  Skin: Negative for rash and wound.  Neurological: Negative for headaches.  Hematological: Negative for adenopathy.  Psychiatric/Behavioral: Negative for confusion.     Physical Exam Updated Vital Signs BP 128/70   Pulse 78   Temp 98.5 F (36.9 C) (Oral)   Resp 18   LMP 01/03/2019   SpO2 100%   Physical Exam Vitals signs and nursing note reviewed.  Constitutional:      General: She is not in acute distress.    Appearance: She is well-developed.  HENT:     Head: Normocephalic.     Nose: Nose normal.     Mouth/Throat:     Mouth: Mucous membranes are moist.  Eyes:     Extraocular Movements: Extraocular movements intact.     Conjunctiva/sclera: Conjunctivae normal.  Neck:     Musculoskeletal: Neck supple.  Cardiovascular:     Rate and Rhythm: Normal rate.  Pulmonary:     Effort: No respiratory distress.  Musculoskeletal:        General: Tenderness present.     Left lower leg: She exhibits tenderness and swelling.       Legs:     Comments: Pedal pulses 2+, adequate circulation. Tender to palpation left calf, posterior knee and thigh.   Skin:    General: Skin is warm and dry.  Neurological:     Mental Status: She is alert and oriented to person, place, and time.  Psychiatric:        Mood and Affect: Mood normal.      ED Treatments / Results  Labs (all labs ordered are listed, but only abnormal results are displayed) Labs Reviewed  BASIC METABOLIC PANEL  CBC  PROTIME-INR  I-STAT BETA HCG BLOOD, ED ([facility], [facility], [facility] ONLY)    Radiology Vas Koreas Lower Extremity Venous (dvt) (only [facility])  Result Date: 01/03/2019  Lower Venous Study  Indications: Pain, and Swelling.  Limitations: Body habitus and musculoskeletal features. Comparison Study: No prior study available Performing Technologist: Melodie BouillonSelina Cole  Examination Guidelines: A complete evaluation includes B-mode imaging, spectral Doppler, color Doppler, and power Doppler as needed of all accessible portions of each vessel. Bilateral testing is considered an integral part of a complete examination. Limited  examinations for reoccurring indications may be performed as noted.  Right Venous Findings: +---+---------------+---------+-----------+----------+-------+    CompressibilityPhasicitySpontaneityPropertiesSummary +---+---------------+---------+-----------+----------+-------+ CFVFull           Yes      Yes                          +---+---------------+---------+-----------+----------+-------+  Left Venous Findings: +---------+---------------+---------+-----------+----------+-------------------+          CompressibilityPhasicitySpontaneityPropertiesSummary             +---------+---------------+---------+-----------+----------+-------------------+ CFV      Full                                                             +---------+---------------+---------+-----------+----------+-------------------+ SFJ      Full                                                             +---------+---------------+---------+-----------+----------+-------------------+ FV Prox  Full                                                             +---------+---------------+---------+-----------+----------+-------------------+ FV Mid   Full                                                             +---------+---------------+---------+-----------+----------+-------------------+ FV DistalFull                                                             +---------+---------------+---------+-----------+----------+-------------------+ PFV      Full                                                              +---------+---------------+---------+-----------+----------+-------------------+ POP      Full           Yes      Yes                  very poor flow      +---------+---------------+---------+-----------+----------+-------------------+ PTV      Full                                                             +---------+---------------+---------+-----------+----------+-------------------+  PERO     Partial                                      prox segment one of                                                       veins is difficult                                                        to compress         +---------+---------------+---------+-----------+----------+-------------------+    Summary: Right: No evidence of common femoral vein obstruction. Left: Findings consistent with acute deep vein thrombosis involving the left peroneal vein. No cystic structure found in the popliteal fossa.  *See table(s) above for measurements and observations.    Preliminary     Procedures Procedures (including critical care time)  Medications Ordered in ED Medications  Rivaroxaban (XARELTO) tablet 15 mg (15 mg Oral Given 01/03/19 1813)  acetaminophen (TYLENOL) tablet 650 mg (650 mg Oral Given 01/03/19 1810)     Initial Impression / Assessment and Plan / ED Course  I have reviewed the triage vital signs and the nursing notes.   33 y.o. female here with left calf and thigh pain stable for d/c with dx of DVT. Pharmacy discussed in detail with patient medications and follow up. Fist dose administered here and coupons for first moth supply given to the patient. [facility] and will call again tomorrow and update them and try to get follow up there. Return precautions discussed.  Final Clinical Impressions(s) / ED Diagnoses   Final diagnoses:  Acute deep vein thrombosis (DVT) of left  peroneal vein    ED Discharge Orders         Ordered    Rivaroxaban 15 & 20 MG TBPK     01/03/19 1756           Kerrie Buffaloeese,  [place] NorthM, NP 01/03/19 2130    Wynetta FinesMessick, Peter C, MD 01/10/19 [phone]

## 2019-01-03 NOTE — ED Notes (Signed)
Pharmacy called for medication. Per pharmacy: they are sending up the medication at this time.

## 2019-01-03 NOTE — ED Notes (Signed)
Per NP: Pt may eat. Pt provided with food

## 2019-01-03 NOTE — Progress Notes (Signed)
ANTICOAGULATION CONSULT NOTE - Initial Consult  Pharmacy Consult for Rivaroxaban Indication: DVT  Allergies  Allergen Reactions  . Diphenhydramine Hcl     Asthma attacks  . Latex Itching    Patient Measurements:   Heparin Dosing Weight:   Vital Signs: Temp: 98.5 F (36.9 C) (01/06 1425) Temp Source: Oral (01/06 1425) BP: 129/82 (01/06 1425) Pulse Rate: 86 (01/06 1425)  Labs: No results for input(s): HGB, HCT, PLT, APTT, LABPROT, INR, HEPARINUNFRC, HEPRLOWMOCWT, CREATININE, CKTOTAL, CKMB, TROPONINI in the last 72 hours.  CrCl cannot be calculated (Patient's most recent lab result is older than the maximum 21 days allowed.).   Medical History: Past Medical History:  Diagnosis Date  . Asthma   . Cyst 06/07/2012  . Endometriosis     Medications:  Scheduled:  . medroxyPROGESTERone  150 mg Intramuscular Q90 days    Assessment: Pharmacy is consulted to dose rivaroxaban in 33 yo female diagnosed with DVt. Pt comes to ED with left leg pain. No PTA anticoagulants noted    Today, 01/03/19  Hgb 12.8, plt 250  INR 0.93, PT 12.4  Scr 0.72 mg/dl  Goal of Therapy:  Monitor platelets by anticoagulation protocol: Yes   Plan:   Rivaroxaban 15 mg PO BID with meals x 21 days, then 20 mg PO daily   Monitor Scr, Hgb, plt as indicated  Patient education done  Manufacturer discount card provided.   Monitor for signs and symptoms of bleeding   Adalberto Cole, PharmD, BCPS Pager [phone] 01/03/2019 6:04 PM

## 2019-01-04 ENCOUNTER — Emergency Department (HOSPITAL_COMMUNITY): Payer: Self-pay

## 2019-01-04 ENCOUNTER — Emergency Department (HOSPITAL_COMMUNITY)
Admission: EM | Admit: 2019-01-04 | Discharge: 2019-01-04 | Disposition: A | Discharge status: Home / Self Care | Payer: Self-pay | Attending: Emergency Medicine | Admitting: Emergency Medicine

## 2019-01-04 ENCOUNTER — Encounter (HOSPITAL_COMMUNITY): Payer: Self-pay

## 2019-01-04 DIAGNOSIS — Z91040 Latex allergy status: Secondary | ICD-10-CM | POA: Insufficient documentation

## 2019-01-04 DIAGNOSIS — J45909 Unspecified asthma, uncomplicated: Secondary | ICD-10-CM | POA: Insufficient documentation

## 2019-01-04 DIAGNOSIS — F419 Anxiety disorder, unspecified: Secondary | ICD-10-CM | POA: Insufficient documentation

## 2019-01-04 DIAGNOSIS — R0602 Shortness of breath: Secondary | ICD-10-CM | POA: Insufficient documentation

## 2019-01-04 DIAGNOSIS — N946 Dysmenorrhea, unspecified: Secondary | ICD-10-CM | POA: Insufficient documentation

## 2019-01-04 DIAGNOSIS — R110 Nausea: Secondary | ICD-10-CM | POA: Insufficient documentation

## 2019-01-04 DIAGNOSIS — M79605 Pain in left leg: Secondary | ICD-10-CM | POA: Insufficient documentation

## 2019-01-04 HISTORY — DX: Acute embolism and thrombosis of unspecified deep veins of unspecified proximal lower extremity: I82.4Y9

## 2019-01-04 LAB — POCT I-STAT TROPONIN I: TROPONIN I, POC: 0 ng/mL (ref 0.00–0.08)

## 2019-01-04 MED ORDER — ONDANSETRON 4 MG PO TBDP
4.0000 mg | ORAL_TABLET | Freq: Once | ORAL | Status: AC
  Administered 2019-01-04: 4 mg via ORAL
  Filled 2019-01-04: qty 1

## 2019-01-04 MED ORDER — SODIUM CHLORIDE 0.9 % IV BOLUS
1000.0000 mL | Freq: Once | INTRAVENOUS | Status: AC
  Administered 2019-01-04: 1000 mL via INTRAVENOUS

## 2019-01-04 MED ORDER — ALBUTEROL SULFATE (2.5 MG/3ML) 0.083% IN NEBU
5.0000 mg | INHALATION_SOLUTION | Freq: Once | RESPIRATORY_TRACT | Status: AC
  Administered 2019-01-04: 5 mg via RESPIRATORY_TRACT
  Filled 2019-01-04: qty 6

## 2019-01-04 MED ORDER — IOPAMIDOL (ISOVUE-370) INJECTION 76%
100.0000 mL | Freq: Once | INTRAVENOUS | Status: AC | PRN
  Administered 2019-01-04: 100 mL via INTRAVENOUS

## 2019-01-04 MED ORDER — SODIUM CHLORIDE (PF) 0.9 % IJ SOLN
INTRAMUSCULAR | Status: AC
  Administered 2019-01-04: 17:00:00
  Filled 2019-01-04: qty 50

## 2019-01-04 MED ORDER — IOPAMIDOL (ISOVUE-370) INJECTION 76%
INTRAVENOUS | Status: AC
  Filled 2019-01-04: qty 100

## 2019-01-04 MED ORDER — ONDANSETRON 4 MG PO TBDP: 4.0000 mg | ORAL_TABLET | Freq: Three times a day (TID) | ORAL | 0 refills | Status: DC | PRN

## 2019-01-04 NOTE — ED Provider Notes (Signed)
[facility]-EMERGENCY DEPT Provider Note   CSN: [id number] Arrival date & time: 01/04/19  1055     History   Chief Complaint Chief Complaint  Patient presents with  . Shortness of Breath  . Leg Pain  . Abdominal Cramping    HPI Lori Mays is a 33 y.o. female presenting today for shortness of breath that began yesterday afternoon.  Patient was diagnosed yesterday's facility with a left lower extremity deep vein thrombosis.  She was given first dose of Xarelto here in emergency department and took her second dose of Xarelto last night however did not take her dose this morning due to concern of shortness of breath.  Patient states that her shortness of breath began shortly after discharge yesterday, she denies chest pain and states that she feels anxious and trouble taking a deep breath.  Patient states that shortness of breath continued to progress until today where EMS was activated.  Patient reports 1 DuoNeb by EMS prior to arrival.  On my initial evaluation patient endorsing left lower extremity pain that she states has been constant for the past few days and attributes to her deep vein thrombosis.  Patient also endorsing difficulty taking a full breath, denies chest pain or pleuritic pain.  --------------------- Labs at 4:52 PM yesterday CBC within normal limits BMP within normal limits Beta hCG negative PT/INR within normal limits ---------------------- HPI  Past Medical History:  Diagnosis Date  . Asthma   . Cyst 06/07/2012  . DVT, lower extremity, proximal (HCC)   . Endometriosis     Patient Active Problem List   Diagnosis Date Noted  . Asthma, chronic 06/22/2013  . Migraine, unspecified 06/22/2013  . ASCUS with positive high risk HPV 06/16/2013  . Arthralgia 06/13/2013  . Endometriosis 06/13/2013  . Edema of both legs 06/13/2013  . Weight gain 06/13/2013  . Cyst 06/07/2012    Past Surgical History:  Procedure Laterality Date  .  laproscopy  2 years ago     OB History    Gravida  0   Para  0   Term  0   Preterm  0   AB  0   Living  0     SAB  0   TAB  0   Ectopic  0   Multiple  0   Live Births               Home Medications    Prior to Admission medications   Medication Sig Start Date End Date Taking? Authorizing Provider  acetaminophen (TYLENOL) 500 MG tablet Take 1,000 mg by mouth every 6 (six) hours as needed for pain.   Yes [provider]  Rivaroxaban 15 & 20 MG TBPK Take as directed on package: Start with one 15mg  tablet by mouth twice a day with food. On Day 22, switch to one 20mg  tablet once a day with food. 01/03/19  Yes Neese, Hope M, NP  albuterol (PROVENTIL HFA;VENTOLIN HFA) 108 (90 BASE) MCG/ACT inhaler Inhale 2 puffs into the lungs every 6 (six) hours as needed for wheezing or shortness of breath. For asthma Patient not taking: Reported on 01/04/2019 02/09/15   Doris Cheadle, MD  hpv vaccine (GARDASIL) injection Inject 0.5 mLs into the muscle once. Patient not taking: Reported on 01/19/2018 06/20/13   [name], MD  ondansetron (ZOFRAN ODT) 4 MG disintegrating tablet Take 1 tablet (4 mg total) by mouth every 8 (eight) hours as needed for nausea or vomiting. 01/04/19  Harlene SaltsMorelli, Taegan Haider A, PA-C  Vitamin D, Ergocalciferol, (DRISDOL) 50000 UNITS CAPS capsule Take 1 capsule (50,000 Units total) by mouth every 7 (seven) days. Patient not taking: Reported on 01/04/2019 02/22/15   Doris CheadleAdvani, Deepak, MD    Family History Family History  Problem Relation Age of Onset  . Cancer Maternal Grandmother   . Cancer Paternal Grandmother     Social History Social History   Tobacco Use  . Smoking status: Never Smoker  . Smokeless tobacco: Never Used  Substance Use Topics  . Alcohol use: Yes    Alcohol/week: 1.0 - 2.0 standard drinks    Types: 1 - 2 Glasses of wine per week  . Drug use: No     Allergies   Diphenhydramine hcl and Latex   Review of Systems Review of Systems    Constitutional: Negative.  Negative for chills and fever.  Respiratory: Positive for shortness of breath. Negative for cough.   Cardiovascular: Negative.  Negative for chest pain and palpitations.  Gastrointestinal: Negative.  Negative for abdominal pain, blood in stool, diarrhea, nausea and vomiting.  Genitourinary: Negative.  Negative for dysuria and hematuria.  Musculoskeletal: Positive for myalgias (Left leg pain). Negative for arthralgias.  Skin: Negative.  Negative for rash.  Neurological: Negative.  Negative for dizziness, weakness and headaches.  Psychiatric/Behavioral: The patient is nervous/anxious.   All other systems reviewed and are negative.  Physical Exam Updated Vital Signs BP 118/82   Pulse 74   Temp 99 F (37.2 C) (Oral)   Resp 20   Ht 5\' 8"  (1.727 m)   Wt 70.3 kg   LMP 01/03/2019   SpO2 99%   BMI 23.57 kg/m   Physical Exam Constitutional:      General: She is not in acute distress.    Appearance: Normal appearance. She is well-developed. She is not ill-appearing or diaphoretic.  HENT:     Head: Normocephalic and atraumatic.     Right Ear: External ear normal.     Left Ear: External ear normal.     Nose: Nose normal.     Mouth/Throat:     Lips: Pink.     Pharynx: Oropharynx is clear. Uvula midline.  Eyes:     General: Vision grossly intact. Gaze aligned appropriately.     Extraocular Movements: Extraocular movements intact.     Conjunctiva/sclera: Conjunctivae normal.     Pupils: Pupils are equal, round, and reactive to light.  Neck:     Musculoskeletal: Full passive range of motion without pain, normal range of motion and neck supple.     Trachea: Trachea and phonation normal. No tracheal deviation.  Cardiovascular:     Rate and Rhythm: Normal rate and regular rhythm.     Pulses:          Dorsalis pedis pulses are 2+ on the right side and 2+ on the left side.       Posterior tibial pulses are 2+ on the right side and 2+ on the left side.      Heart sounds: Normal heart sounds.  Pulmonary:     Effort: Pulmonary effort is normal. No tachypnea, accessory muscle usage or respiratory distress.     Breath sounds: Normal breath sounds and air entry. No decreased breath sounds or rhonchi.  Chest:     Chest wall: No deformity, tenderness or crepitus.  Abdominal:     General: Bowel sounds are normal.     Palpations: Abdomen is soft.     Tenderness: There is  abdominal tenderness in the suprapubic area. There is no right CVA tenderness, left CVA tenderness, guarding or rebound. Negative signs include Murphy's sign and McBurney's sign.  Genitourinary:    Comments: GU exam deferred by patient Musculoskeletal: Normal range of motion.     Right lower leg: She exhibits no tenderness.     Left lower leg: She exhibits tenderness.  Feet:     Right foot:     Protective Sensation: 3 sites tested. 3 sites sensed.     Left foot:     Protective Sensation: 3 sites tested. 3 sites sensed.  Skin:    General: Skin is warm and dry.     Capillary Refill: Capillary refill takes less than 2 seconds.  Neurological:     General: No focal deficit present.     Mental Status: She is alert and oriented to person, place, and time.     GCS: GCS eye subscore is 4. GCS verbal subscore is 5. GCS motor subscore is 6.     Comments: Speech is clear and goal oriented, follows commands Major Cranial nerves without deficit, no facial droop Normal strength in upper and lower extremities bilaterally including dorsiflexion and plantar flexion, strong and equal grip strength Sensation normal to light touch Moves extremities without ataxia, coordination intact Normal gait  Psychiatric:        Behavior: Behavior normal.    ED Treatments / Results  Labs (all labs ordered are listed, but only abnormal results are displayed) Labs Reviewed  I-STAT TROPONIN, ED  POCT I-STAT TROPONIN I    EKG EKG Interpretation  Date/Time:  Tuesday January 04 2019 11:09:01  EST Ventricular Rate:  80 PR Interval:    QRS Duration: 65 QT Interval:  373 QTC Calculation: 431 R Axis:   80 Text Interpretation:  Sinus rhythm Borderline short PR interval Right atrial enlargement Probable anteroseptal infarct, old Minimal ST depression, inferior leads No significant change since last tracing Confirmed by Gwyneth Sprout ([id number]) on 01/04/2019 1:25:43 PM  Radiology Dg Chest 2 View  Result Date: 01/04/2019 CLINICAL DATA:  Shortness of breath, history of asthma EXAM: CHEST - 2 VIEW COMPARISON:  Chest x-ray of 11/04/2008 FINDINGS: No active infiltrate or effusion is seen. Mediastinal and hilar contours are unremarkable. A button overlies the right upper lung. The heart is within normal limits in size. No bony abnormality is seen. IMPRESSION: No active cardiopulmonary disease. Electronically Signed   By: Dwyane Dee M.D.   On: 01/04/2019 12:04   Ct Angio Chest Pe W And/or Wo Contrast  Result Date: 01/04/2019 CLINICAL DATA:  Newly diagnosed DVT, difficulty breathing, evaluate for PE EXAM: CT ANGIOGRAPHY CHEST WITH CONTRAST TECHNIQUE: Multidetector CT imaging of the chest was performed using the standard protocol during bolus administration of intravenous contrast. Multiplanar CT image reconstructions and MIPs were obtained to evaluate the vascular anatomy. CONTRAST:  ISOVUE-370 IOPAMIDOL (ISOVUE-370) INJECTION 76% COMPARISON:  Chest radiographs dated 01/04/2019 FINDINGS: Cardiovascular: Satisfactory opacification of the bilateral pulmonary arteries to the segmental level. No evidence of pulmonary embolism. No evidence of thoracic aortic aneurysm or dissection. The heart is normal in size.  No pericardial effusion. Mediastinum/Nodes: No suspicious mediastinal lymphadenopathy. Triangular soft tissue along the anterior mediastinum, likely reflecting residual thymus (series 9/image 34). Visualized thyroid is unremarkable. Lungs/Pleura: Evaluation of the lung parenchyma is constrained by  respiratory motion. Within that constraint, there are no suspicious pulmonary nodules. No focal consolidation. No pleural effusion or pneumothorax. Upper Abdomen: Visualized upper abdomen is unremarkable. Musculoskeletal: Visualized  osseous structures are within normal limits. Review of the MIP images confirms the above findings. IMPRESSION: No evidence pulmonary embolism. Negative CT chest. Electronically Signed   By: Charline Bills M.D.   On: 01/04/2019 15:07   Vas [place] Lower Extremity Venous (dvt) (only [facility])  Result Date: 01/03/2019  Lower Venous Study Indications: Pain, and Swelling.  Limitations: Body habitus and musculoskeletal features. Comparison Study: No prior study available Performing Technologist: Melodie Bouillon  Examination Guidelines: A complete evaluation includes B-mode imaging, spectral Doppler, color Doppler, and power Doppler as needed of all accessible portions of each vessel. Bilateral testing is considered an integral part of a complete examination. Limited examinations for reoccurring indications may be performed as noted.  Right Venous Findings: +---+---------------+---------+-----------+----------+-------+    CompressibilityPhasicitySpontaneityPropertiesSummary +---+---------------+---------+-----------+----------+-------+ CFVFull           Yes      Yes                          +---+---------------+---------+-----------+----------+-------+  Left Venous Findings: +---------+---------------+---------+-----------+----------+-------------------+          CompressibilityPhasicitySpontaneityPropertiesSummary             +---------+---------------+---------+-----------+----------+-------------------+ CFV      Full                                                             +---------+---------------+---------+-----------+----------+-------------------+ SFJ      Full                                                              +---------+---------------+---------+-----------+----------+-------------------+ FV Prox  Full                                                             +---------+---------------+---------+-----------+----------+-------------------+ FV Mid   Full                                                             +---------+---------------+---------+-----------+----------+-------------------+ FV DistalFull                                                             +---------+---------------+---------+-----------+----------+-------------------+ PFV      Full                                                             +---------+---------------+---------+-----------+----------+-------------------+  POP      Full           Yes      Yes                  very poor flow      +---------+---------------+---------+-----------+----------+-------------------+ PTV      Full                                                             +---------+---------------+---------+-----------+----------+-------------------+ PERO     Partial                                      prox segment one of                                                       veins is difficult                                                        to compress         +---------+---------------+---------+-----------+----------+-------------------+    Summary: Right: No evidence of common femoral vein obstruction. Left: Findings consistent with acute deep vein thrombosis involving the left peroneal vein. No cystic structure found in the popliteal fossa.  *See table(s) above for measurements and observations.    Preliminary     Procedures Procedures (including critical care time)  Medications Ordered in ED Medications  sodium chloride (PF) 0.9 % injection (has no administration in time range)  iopamidol (ISOVUE-370) 76 % injection (has no administration in time range)  albuterol (PROVENTIL) (2.5  MG/3ML) 0.083% nebulizer solution 5 mg (5 mg Nebulization Given 01/04/19 1326)  sodium chloride 0.9 % bolus 1,000 mL (1,000 mLs Intravenous Bolus 01/04/19 1415)  iopamidol (ISOVUE-370) 76 % injection 100 mL (100 mLs Intravenous Contrast Given 01/04/19 1453)  ondansetron (ZOFRAN-ODT) disintegrating tablet 4 mg (4 mg Oral Given 01/04/19 1624)     Initial Impression / Assessment and Plan / ED Course  I have reviewed the triage vital signs and the nursing notes.  Pertinent labs & imaging results that were available during my care of the patient were reviewed by me and considered in my medical decision making (see chart for details).     59:106 PM: 33 year old female presenting with shortness of breath, diagnosed yesterday with DVT of the left lower extremity, she states that she had dose yesterday at emergency department and again at night of her Xarelto.  She states that she did not take her dose this morning due to shortness of breath.  On initial evaluation patient uncomfortable appearing however no acute distress, heart rate 60 bpm, SPO2 100% on room air.  Patient endorses difficulty taking a full breath however denying chest pain.  Patient with basic lab work performed yesterday, negative pregnancy test and unremarkable BMP.  CT Angio of the chest as well as troponin  have been ordered along with fluids, I discussed with radiology about patient's lab work done yesterday and they will move her up in line to receive her CT.  Case discussed with Dr. Anitra LauthPlunkett. ----------------- 2:30 PM: Patient reassessed, still uncomfortable appearing however no acute distress.  Patient did receive additional albuterol nebulizer here in emergency department without improvement of symptoms.  She is denying chest pain, endorses nervousness and difficulty taking a full breath.  Patient now has IV, I have rediscussed need for CT scan with radiology and she is next in line.  SPO2 100% on room air, pulse 70  bpm. --------------------- Troponin negative Chest x-ray negative EKG without acute changes reviewed by Dr. Anitra LauthPlunkett CT angio negative --------------------- On reevaluation patient resting comfortably in no acute distress.  She states relief of symptoms following knowledge of negative CT scanning.  Further discussion was held with patient, she attributes her shortness of breath today to her anxiety in addition to her menstrual cramps.  Patient states that she began her period yesterday and has had moderate intensity menstrual cramping's since that time.  Patient states that she has a history of endometri[facility] and has been using Tylenol for her pain with some relief.  Patient states that she has not been using her usual Advil due to risk of bleeding.  Patient states that she has an appointment with her OB/GYN this week for further evaluation of her endometriosis and menstrual cramps and does not wish to have GU examination performed in emergency department today. --------------- Patient informed that she may return to emergency department anytime if she changes her mind for further evaluation of her menstrual cramps.  Patient informed that without pelvic examination that infection and other causes of pelvic pain and females cannot be evaluated, she states understanding and does not wish to have pelvic examination performed today. ---------- Patient endorsing some nausea without vomiting this morning with her menstrual cramps and is questioning medication for nausea.  She has been given Zofran here in emergency department and has taken her next home dose of Xarelto.  Patient and her mother state understanding of importance of taking Xarelto are agreeable to discharge at this time.  Patient reassessed prior to discharge, she is resting comfortably in bed eating [employer] without difficulty.   At this time there does not appear to be any evidence of an acute  emergency medical condition and the patient appears stable for discharge with appropriate outpatient follow up. Diagnosis was discussed with patient who verbalizes understanding of care plan and is agreeable to discharge. I have discussed return precautions with patient and mother who verbalize understanding of return precautions. Patient strongly encouraged to follow-up with their PCP and OBGYN this week. All questions answered.  Patient's case discussed with Dr. Anitra LauthPlunkett who agrees with plan to discharge with zofran with follow-up.   Note: Portions of this report may have been transcribed using voice recognition software. Every effort was made to ensure accuracy; however, inadvertent computerized transcription errors may still be present. Final Clinical Impressions(s) / ED Diagnoses   Final diagnoses:  Shortness of breath  Menstrual cramps    ED Discharge Orders         Ordered    ondansetron (ZOFRAN ODT) 4 MG disintegrating tablet  Every 8 hours PRN     01/04/19 1629           Elizabeth PalauMorelli, Gesselle Fitzsimons A, PA-C 01/04/19 1644    Gwyneth SproutPlunkett, Whitney, MD 01/07/19 [phone]

## 2019-01-04 NOTE — Discharge Instructions (Addendum)
You have been diagnosed today with shortness of breath.   At this time there does not appear to be the presence of an emergent medical condition, however there is always the potential for conditions to change. Please read and follow the below instructions.  Please return to the Emergency Department immediately for any new or worsening symptoms. Please be sure to follow up with your Primary Care Provider this week regarding your visit today; please call their office to schedule an appointment even if you are feeling better for a follow-up visit. Please take your blood thinning medication Xarelto as previously prescribed. You may use the medication Zofran as prescribed for nausea.  Please drink plenty of water and get plenty of rest to help with your symptoms. You have refused pelvic examination today.  Please follow-up with your OB/GYN this week for further evaluation of your menstrual cramps.  If you change your mind you may return to the emergency department anytime for further evaluation.  Get help right away if: Your shortness of breath gets worse. You have shortness of breath when you are resting. You feel light-headed or you faint. You have a cough that is not controlled with medicines. You cough up blood. You have pain with breathing. You have pain in your chest, arms, shoulders, or abdomen. You have a fever. You cannot walk up stairs or exercise the way that you normally do. You have pain that gets worse. You have pain that does not get better with medicine. You have pain during sex. You feel sick to your stomach or you throw up during your period, and medicine does not help. You pass out (faint).  Please read the additional information packets attached to your discharge summary.  Do not take your medicine if  develop an itchy rash, swelling in your mouth or lips, or difficulty breathing.

## 2019-01-04 NOTE — ED Notes (Signed)
ED Provider at bedside. 

## 2019-01-04 NOTE — ED Triage Notes (Addendum)
Per EMS- Patient called EMS for c/o SOB. Patient has a history of asthma. EMS reported that the fire dept had already given the patient a neb treatment. Patient's lungs were clear when EMS arrived. Patient also c/o "period cramping" and leg pain when she stands. Patient states she was diagnosed with a DVT yesterday.  Lungs clear in triage.

## 2019-01-05 ENCOUNTER — Encounter: Payer: Self-pay | Admitting: Nurse Practitioner

## 2019-01-05 ENCOUNTER — Ambulatory Visit: Payer: Self-pay | Attending: Nurse Practitioner | Admitting: Nurse Practitioner

## 2019-01-05 ENCOUNTER — Other Ambulatory Visit: Payer: Self-pay

## 2019-01-05 VITALS — BP 123/79 | HR 57 | Temp 98.9°F | Ht 68.0 in | Wt 156.0 lb

## 2019-01-05 DIAGNOSIS — Z793 Long term (current) use of hormonal contraceptives: Secondary | ICD-10-CM | POA: Insufficient documentation

## 2019-01-05 DIAGNOSIS — I82492 Acute embolism and thrombosis of other specified deep vein of left lower extremity: Secondary | ICD-10-CM | POA: Insufficient documentation

## 2019-01-05 DIAGNOSIS — Z7901 Long term (current) use of anticoagulants: Secondary | ICD-10-CM | POA: Insufficient documentation

## 2019-01-05 DIAGNOSIS — Z79899 Other long term (current) drug therapy: Secondary | ICD-10-CM | POA: Insufficient documentation

## 2019-01-05 DIAGNOSIS — Z86718 Personal history of other venous thrombosis and embolism: Secondary | ICD-10-CM | POA: Insufficient documentation

## 2019-01-05 DIAGNOSIS — J4520 Mild intermittent asthma, uncomplicated: Secondary | ICD-10-CM | POA: Insufficient documentation

## 2019-01-05 MED ORDER — ALBUTEROL SULFATE HFA 108 (90 BASE) MCG/ACT IN AERS: 2.0000 | INHALATION_SPRAY | Freq: Four times a day (QID) | RESPIRATORY_TRACT | 0 refills | Status: DC | PRN

## 2019-01-05 MED ORDER — ACETAMINOPHEN-CODEINE #3 300-30 MG PO TABS: 1.0000 | ORAL_TABLET | Freq: Four times a day (QID) | ORAL | 0 refills | Status: AC | PRN

## 2019-01-05 MED FILL — ACETAMINOPHEN/COD #3 TABLET: 300-30 | 8 days supply | Qty: 60 | Fill #0

## 2019-01-05 MED FILL — !VENTOLIN HFA INHALER: 108 (90 BAS | 18 days supply | Qty: 18 | Fill #0

## 2019-01-05 NOTE — Progress Notes (Signed)
Assessment & Plan:  Lori Mays was seen today for hospitalization follow-up.  Diagnoses and all orders for this visit:  Acute deep vein thrombosis (DVT) of other specified vein of left lower extremity (HCC) -     acetaminophen-codeine (TYLENOL #3) 300-30 MG tablet; Take 1-2 tablets by mouth every 6 (six) hours as needed for moderate pain or severe pain.  Mild intermittent asthma without complication -     albuterol (PROVENTIL HFA;VENTOLIN HFA) 108 (90 Base) MCG/ACT inhaler; Inhale 2 puffs into the lungs every 6 (six) hours as needed for wheezing or shortness of breath.    Patient has been counseled on age-appropriate routine health concerns for screening and prevention. These are reviewed and up-to-date. Referrals have been placed accordingly. Immunizations are up-to-date or declined.    Subjective:   Chief Complaint  Patient presents with  . Hospitalization Follow-up    acute deep vein thrombosis   HPI Lori Mays 33 y.o. female presents to office today establish care and with recent history of acute DVT of the left lower extremity.  He does not have much past medical history chronic asthma, migraines, endometriosis (being followed [facility]).   DVT She was seen in the ED on 01/03/2019 with complaints of left lower extremity pain and swelling.  Vascular ultrasound revealed lower extremity DVT. She was started on Xarelto upon discharge.  Troponin I was negative as well as beta hCG blood test.  PT/INR was normal with no additional hyper coag profile performed at that time. Patient has been advised to apply for financial assistance and schedule to see our financial counselor.  She will need to be referred to hematology as it appears DVT was unprovoked at this time. She has used oral contraceptives in the past but reports she has not taken birth control pills in over 2 years also has not received a Depo-Provera injection in over 2 years as well and she has never smoked. BMI  23. She currently endorses moderate to severe leg pain aggravated by weightbearing.  Pain with weightbearing is 10 out of 10 but currently sitting here office pain is 4 out of 10.  She has not purchased any compression stockings at this time but does report she is elevating leg as often as possible at rest.  Currently denies any chest pain or shortness of breath.   ASTHMA She endorses a history of childhood asthma which is well controlled with as needed use of albuterol inhaler which she is requesting a refill of today.  She currently denies any shortness of breath, cough or wheezing.   Review of Systems  Constitutional: Negative for fever, malaise/fatigue and weight loss.  HENT: Negative.  Negative for nosebleeds.   Eyes: Negative.  Negative for blurred vision, double vision and photophobia.  Respiratory: Negative.  Negative for cough, sputum production, shortness of breath and wheezing.   Cardiovascular: Positive for leg swelling. Negative for chest pain and palpitations.       See HPI  Gastrointestinal: Negative.  Negative for heartburn, nausea and vomiting.  Musculoskeletal: Negative for myalgias.       See HPI  Neurological: Negative.  Negative for dizziness, focal weakness, seizures and headaches.  Psychiatric/Behavioral: Negative.  Negative for suicidal ideas.    Past Medical History:  Diagnosis Date  . Asthma   . Cyst 06/07/2012  . DVT, lower extremity, proximal (HCC)   . Endometriosis     Past Surgical History:  Procedure Laterality Date  . laproscopy  2 years ago  Family History  Problem Relation Age of Onset  . Cancer Maternal Grandmother   . Cancer Paternal Grandmother     Social History Reviewed with no changes to be made today.   Outpatient Medications Prior to Visit  Medication Sig Dispense Refill  . Rivaroxaban 15 & 20 MG TBPK Take as directed on package: Start with one 15mg  tablet by mouth twice a day with food. On Day 22, switch to one 20mg  tablet once a  day with food. 51 each 0  . acetaminophen (TYLENOL) 500 MG tablet Take 1,000 mg by mouth every 6 (six) hours as needed for pain.    Marland Kitchen ondansetron (ZOFRAN ODT) 4 MG disintegrating tablet Take 1 tablet (4 mg total) by mouth every 8 (eight) hours as needed for nausea or vomiting. (Patient not taking: Reported on 01/05/2019) 9 tablet 0  . albuterol (PROVENTIL HFA;VENTOLIN HFA) 108 (90 BASE) MCG/ACT inhaler Inhale 2 puffs into the lungs every 6 (six) hours as needed for wheezing or shortness of breath. For asthma (Patient not taking: Reported on 01/04/2019) 1 Inhaler 3  . hpv vaccine (GARDASIL) injection Inject 0.5 mLs into the muscle once. (Patient not taking: Reported on 01/19/2018) 0.5 mL 0  . Vitamin D, Ergocalciferol, (DRISDOL) 50000 UNITS CAPS capsule Take 1 capsule (50,000 Units total) by mouth every 7 (seven) days. (Patient not taking: Reported on 01/04/2019) 12 capsule 0  . medroxyPROGESTERone (DEPO-PROVERA) injection 150 mg      No facility-administered medications prior to visit.     Allergies  Allergen Reactions  . Diphenhydramine Hcl     Asthma attacks  . Latex Itching       Objective:    BP 123/79 (BP Location: Left Arm, Patient Position: Sitting, Cuff Size: Normal)   Pulse (!) 57   Temp 98.9 F (37.2 C) (Oral)   Ht 5\' 8"  (1.727 m)   Wt 156 lb (70.8 kg)   LMP 01/03/2019   SpO2 95%   BMI 23.72 kg/m  Wt Readings from Last 3 Encounters:  01/05/19 156 lb (70.8 kg)  01/04/19 155 lb (70.3 kg)  09/03/17 150 lb 8 oz (68.3 kg)    Physical Exam Vitals signs and nursing note reviewed.  Constitutional:      Appearance: She is well-developed.  HENT:     Head: Normocephalic and atraumatic.  Neck:     Musculoskeletal: Normal range of motion.  Cardiovascular:     Rate and Rhythm: Normal rate and regular rhythm.     Heart sounds: Normal heart sounds. No murmur. No friction rub. No gallop.   Pulmonary:     Effort: Pulmonary effort is normal. No tachypnea or respiratory distress.      Breath sounds: Normal breath sounds. No decreased breath sounds, wheezing, rhonchi or rales.  Chest:     Chest wall: No tenderness.  Abdominal:     General: Bowel sounds are normal.     Palpations: Abdomen is soft.  Musculoskeletal: Normal range of motion.        General: Swelling (Left lower extremity) present.  Skin:    General: Skin is warm and dry.  Neurological:     Mental Status: She is alert and oriented to person, place, and time.     Coordination: Coordination normal.  Psychiatric:        Behavior: Behavior normal. Behavior is cooperative.        Thought Content: Thought content normal.        Judgment: Judgment normal.  Patient has been counseled extensively about nutrition and exercise as well as the importance of adherence with medications and regular follow-up. The patient was given clear instructions to go to ER or return to medical center if symptoms don't improve, worsen or new problems develop. The patient verbalized understanding.   Follow-up: Return in about 6 weeks (around 02/16/2019) for DVT , GIVE FINANCIAL FORMS TODAY Needs appointment with financial representative.Claiborne Rigg, FNP-BC [facility] [place], [place] [phone]   01/05/2019, 12:56 PM

## 2019-01-05 NOTE — Patient Instructions (Signed)
Deep Vein Thrombosis    Deep vein thrombosis (DVT) is a condition in which a blood clot forms in a deep vein, such as a lower leg, thigh, or arm vein. A clot is blood that has thickened into a gel or solid. This condition is dangerous. It can lead to serious and even life-threatening complications if the clot travels to the lungs and causes a blockage (pulmonary embolism). It can also damage veins in the leg. This can result in leg pain, swelling, discoloration, and sores (post-thrombotic syndrome).  What are the causes?  This condition may be caused by:   A slowdown of blood flow.   Damage to a vein.   A condition that causes blood to clot more easily, such as an inherited clotting disorder.  What increases the risk?  The following factors may make you more likely to develop this condition:   Being overweight.   Being older, especially over age 60.   Sitting or lying down for more than four hours.   Being in the hospital.   Lack of physical activity (sedentary lifestyle).   Pregnancy, being in childbirth, or having recently given birth.   Taking medicines that contain estrogen, such as medicines to prevent pregnancy.   Smoking.   A history of any of the following:  ? Blood clots or a blood clotting disease.  ? Peripheral vascular disease.  ? Inflammatory bowel disease.  ? Cancer.  ? Heart disease.  ? Genetic conditions that affect how your blood clots, such as Factor V Leiden mutation.  ? Neurological diseases that affect your legs (leg paresis).  ? A recent injury, such as a car accident.  ? Major or lengthy surgery.  ? A central line placed inside a large vein.  What are the signs or symptoms?  Symptoms of this condition include:   Swelling, pain, or tenderness in an arm or leg.   Warmth, redness, or discoloration in an arm or leg.  If the clot is in your leg, symptoms may be more noticeable or worse when you stand or walk. Some people may not develop any symptoms.  How is this diagnosed?  This  condition is diagnosed with:   A medical history and physical exam.   Tests, such as:  ? Blood tests. These are done to check how well your blood clots.  ? Ultrasound. This is done to check for clots.  ? Venogram. For this test, contrast dye is injected into a vein and X-rays are taken to check for any clots.  How is this treated?  Treatment for this condition depends on:   The cause of your DVT.   Your risk for bleeding or developing more clots.   Any other medical conditions that you have.  Treatment may include:   Taking a blood thinner (anticoagulant). This type of medicine prevents clots from forming. It may be taken by mouth, injected under the skin, or injected through an IV (catheter).   Injecting clot-dissolving medicines into the affected vein (catheter-directed thrombolysis).   Having surgery. Surgery may be done to:  ? Remove the clot.  ? Place a filter in a large vein to catch blood clots before they reach the lungs.  Some treatments may be continued for up to six months.  Follow these instructions at home:  If you are taking blood thinners:   Take the medicine exactly as told by your health care provider. Some blood thinners need to be taken at the same time every day.   Do not skip a dose.   Talk with your health care provider before you take any medicines that contain aspirin or NSAIDs. These medicines increase your risk for dangerous bleeding.   Ask your health care provider about foods and drugs that could change the way the medicine works (may interact). Avoid those things if your health care provider tells you to do so.   Blood thinners can cause easy bruising and may make it difficult to stop bleeding. Because of this:  ? Be very careful when using knives, scissors, or other sharp objects.  ? Use an electric razor instead of a blade.  ? Avoid activities that could cause injury or bruising, and follow instructions about how to prevent falls.   Wear a medical alert bracelet or carry a  card that lists what medicines you take.  General instructions   Take over-the-counter and prescription medicines only as told by your health care provider.   Return to your normal activities as told by your health care provider. Ask your health care provider what activities are safe for you.   Wear compression stockings if recommended by your health care provider.   Keep all follow-up visits as told by your health care provider. This is important.  How is this prevented?  To lower your risk of developing this condition again:   For 30 or more minutes every day, do an activity that:  ? Involves moving your arms and legs.  ? Increases your heart rate.   When traveling for longer than four hours:  ? Exercise your arms and legs every hour.  ? Drink plenty of water.  ? Avoid drinking alcohol.   Avoid sitting or lying for a long time without moving your legs.   If you have surgery or you are hospitalized, ask about ways to prevent blood clots. These may include taking frequent walks or using anticoagulants.   Stay at a healthy weight.   If you are a woman who is older than age 35, avoid unnecessary use of medicines that contain estrogen, such as some birth control pills.   Do not use any products that contain nicotine or tobacco, such as cigarettes and e-cigarettes. This is especially important if you take estrogen medicines. If you need help quitting, ask your health care provider.  Contact a health care provider if:   You miss a dose of your blood thinner.   Your menstrual period is heavier than usual.   You have unusual bruising.  Get help right away if:   You have:  ? New or increased pain, swelling, or redness in an arm or leg.  ? Numbness or tingling in an arm or leg.  ? Shortness of breath.  ? Chest pain.  ? A rapid or irregular heartbeat.  ? A severe headache or confusion.  ? A cut that will not stop bleeding.   There is blood in your vomit, stool, or urine.   You have a serious fall or accident,  or you hit your head.   You feel light-headed or dizzy.   You cough up blood.  These symptoms may represent a serious problem that is an emergency. Do not wait to see if the symptoms will go away. Get medical help right away. Call your local emergency services (911 in the [place] Do not drive yourself to the hospital.  Summary   Deep vein thrombosis (DVT) is a condition in which a blood clot forms in a deep vein, such as a   lower leg, thigh, or arm vein.   Symptoms can include swelling, warmth, pain, and redness in your leg or arm.   This condition may be treated with a blood thinner (anticoagulant medicine), medicine that is injected to dissolve blood clots,compression stockings, or surgery.   If you are prescribed blood thinners, take them exactly as told.  This information is not intended to replace advice given to you by your health care provider. Make sure you discuss any questions you have with your health care provider.  Document Released: 12/15/2005 Document Revised: 05/15/2017 Document Reviewed: 05/15/2017  Elsevier Interactive Patient Education  2019 [employer] Inc[employer]        How to Use Compression Stockings  Compression stockings are elastic socks that squeeze the legs. They help increase blood flow (circulation) to the legs, decrease swelling in the legs, and reduce the chance of developing blood clots in the lower legs. Compression stockings are often used by people who:   Are recovering from surgery.   Have poor circulation in their legs.   Tend to get blood clots in their legs.   Have bulging (varicose) veins.   Sit or stay in bed for long periods of time.  Follow instructions from your health care provider about how and when to wear your compression stockings.  How to wear compression stockings  Before you put on your compression stockings:   Make sure that they are the correct size and degree of compression. If you do not know your size or required grade of compression, ask your health care  provider and follow the manufacturer's instructions that come with the stockings.   Make sure that they are clean, dry, and in good condition.   Check them for rips and tears. Do not put them on if they are ripped or torn.  Put your stockings on first thing in the morning, before you get out of bed. Keep them on for as long as your health care provider advises. When you are wearing your stockings:   Keep them as smooth as possible. Do not allow them to bunch up. It is especially important to prevent the stockings from bunching up around your toes or behind your knees.   Do not roll the stockings downward and leave them rolled down. This can decrease blood flow to your leg.   Change them right away if they become wet or dirty.  When you take off your stockings, inspect your legs and feet. Check for:   Open sores.   Red spots.   Swelling.  General tips   Do not stop wearing compression stockings without talking to your health care provider first.   Wash your stockings every day with mild detergent in cold or warm water. Do not use bleach. Air-dry your stockings or dry them in a clothes dryer on low heat. It may be helpful to have two pairs so that you have a pair to wear while the other is being washed.   Replace your stockings every 3-6 months.   If skin moisturizing is part of your treatment plan, apply lotion or cream at night so that your skin will be dry when you put on the stockings in the morning. It is harder to put the stockings on when you have lotion on your legs or feet.   Wear nonskid shoes or slip-resistant socks when walking while wearing compression stockings.  Contact a health care provider and remove your stockings if you have:   A feeling of pins and   needles in your feet or legs.   Open sores, red spots, or other skin changes on your feet or legs.   Swelling or pain that gets worse.  Get help right away if you have:   Numbness or tingling in your lower legs that does not get better  right after you take the stockings off.   Toes or feet that are unusually cold or turn a bluish color.   A warm or red area on your leg.   New swelling or soreness in your leg.   Shortness of breath.   Chest pain.   A fast or irregular heartbeat.   Light-headedness.   Dizziness.  Summary   Compression stockings are elastic socks that squeeze the legs.   They help increase blood flow (circulation) to the legs, decrease swelling in the legs, and reduce the chance of developing blood clots in the lower legs.   Follow instructions from your health care provider about how and when to wear your compression stockings.   Do not stop wearing your compression stockings without talking to your health care provider first.  This information is not intended to replace advice given to you by your health care provider. Make sure you discuss any questions you have with your health care provider.  Document Released: 10/12/2009 Document Revised: 12/17/2017 Document Reviewed: 12/17/2017  Elsevier Interactive Patient Education  2019 [employer] Inc[employer]

## 2019-01-24 ENCOUNTER — Other Ambulatory Visit: Payer: Self-pay | Admitting: General Practice

## 2019-01-24 MED ORDER — RIVAROXABAN 20 MG PO TABS: 20.0000 mg | ORAL_TABLET | Freq: Every day | ORAL | 0 refills | Status: DC

## 2019-01-24 NOTE — Telephone Encounter (Signed)
1) Medication(s) Requested (by name): -Xarelto 2) Pharmacy of Choice: -[facility]  3) Special Requests:   Approved medications will be sent to the pharmacy, we will reach out if there is an issue.  Requests made after 3pm may not be addressed until the following business day!  If a patient is unsure of the name of the medication(s) please note and ask patient to call back when they are able to provide all info, do not send to responsible party until all information is available!

## 2019-01-25 MED FILL — XARELTO 20 MG TABLET: 20 | 30 days supply | Qty: 30 | Fill #0

## 2019-02-09 ENCOUNTER — Telehealth: Payer: Self-pay | Admitting: Family Medicine

## 2019-02-09 NOTE — Telephone Encounter (Signed)
Attempted to call pt with nurse visit appt for 2/13 @ 9am. No answer, LVM with appt and if needing to reschedule to give the office a call back.

## 2019-02-10 ENCOUNTER — Ambulatory Visit (INDEPENDENT_AMBULATORY_CARE_PROVIDER_SITE_OTHER): Payer: Self-pay | Admitting: General Practice

## 2019-02-10 DIAGNOSIS — N898 Other specified noninflammatory disorders of vagina: Secondary | ICD-10-CM

## 2019-02-10 DIAGNOSIS — Z113 Encounter for screening for infections with a predominantly sexual mode of transmission: Secondary | ICD-10-CM

## 2019-02-10 DIAGNOSIS — N760 Acute vaginitis: Secondary | ICD-10-CM

## 2019-02-10 DIAGNOSIS — B9689 Other specified bacterial agents as the cause of diseases classified elsewhere: Secondary | ICD-10-CM

## 2019-02-10 NOTE — Progress Notes (Signed)
Patient presents to office today for self swab reporting creamy vaginal discharge with irritation and odor for the past 3 days. Patient thinks this is related to a change in soaps. Patient instructed in self swab & specimen collected. Patient also elects gc/ch/trich testing. Discussed results will be back in 24-48 hours & can be available in mychart. Told patient we will reach out with any abnormal results. Patient verbalized understanding & had no questions.  Chase Callerarrie H RN BSN 02/10/19

## 2019-02-14 ENCOUNTER — Telehealth: Payer: Self-pay | Admitting: General Practice

## 2019-02-14 DIAGNOSIS — N760 Acute vaginitis: Principal | ICD-10-CM

## 2019-02-14 DIAGNOSIS — B9689 Other specified bacterial agents as the cause of diseases classified elsewhere: Secondary | ICD-10-CM

## 2019-02-14 LAB — CERVICOVAGINAL ANCILLARY ONLY
Bacterial vaginitis: POSITIVE — AB
Candida vaginitis: NEGATIVE
Chlamydia: NEGATIVE
Neisseria Gonorrhea: NEGATIVE
Trichomonas: NEGATIVE

## 2019-02-14 MED ORDER — METRONIDAZOLE 500 MG PO TABS: 500.0000 mg | ORAL_TABLET | Freq: Two times a day (BID) | ORAL | 0 refills | Status: DC

## 2019-02-14 NOTE — Telephone Encounter (Signed)
Patient called and left message on nurse voicemail line requesting results. Called patient & informed her of BV, medication sent to pharmacy and instructions. Patient verbalized understanding to all & had no questions.

## 2019-02-15 ENCOUNTER — Encounter: Payer: Self-pay | Admitting: Nurse Practitioner

## 2019-02-15 ENCOUNTER — Ambulatory Visit: Payer: Self-pay | Attending: Nurse Practitioner | Admitting: Nurse Practitioner

## 2019-02-15 VITALS — BP 110/74 | HR 66 | Temp 99.2°F | Ht 68.0 in | Wt 151.8 lb

## 2019-02-15 DIAGNOSIS — R110 Nausea: Secondary | ICD-10-CM

## 2019-02-15 DIAGNOSIS — B9689 Other specified bacterial agents as the cause of diseases classified elsewhere: Secondary | ICD-10-CM

## 2019-02-15 DIAGNOSIS — I82492 Acute embolism and thrombosis of other specified deep vein of left lower extremity: Secondary | ICD-10-CM

## 2019-02-15 DIAGNOSIS — N760 Acute vaginitis: Secondary | ICD-10-CM

## 2019-02-15 MED ORDER — ONDANSETRON 4 MG PO TBDP: 4.0000 mg | ORAL_TABLET | Freq: Three times a day (TID) | ORAL | 0 refills | Status: AC | PRN

## 2019-02-15 MED ORDER — ONDANSETRON 4 MG PO TBDP: 4.0000 mg | ORAL_TABLET | Freq: Three times a day (TID) | ORAL | 0 refills | Status: DC | PRN

## 2019-02-15 MED ORDER — METRONIDAZOLE 500 MG PO TABS: 500.0000 mg | ORAL_TABLET | Freq: Two times a day (BID) | ORAL | 0 refills | Status: DC

## 2019-02-15 NOTE — Patient Instructions (Signed)
Nausea, Adult Nausea is feeling sick to your stomach or feeling that you are about to throw up (vomit). Feeling sick to your stomach is usually not serious, but it may be an early sign of a more serious medical problem. As you feel sicker to your stomach, you may throw up. If you throw up, or if you are not able to drink enough fluids, there is a risk that you may lose too much water in your body (get dehydrated). If you lose too much water in your body, you may:  Feel tired.  Feel thirsty.  Have a dry mouth.  Have cracked lips.  Go pee (urinate) less often. Older adults and people who have other diseases or a weak body defense system (immune system) have a higher risk of losing too much water in the body. The main goals of treating this condition are:  To relieve your nausea.  To ensure your nausea occurs less often.  To prevent throwing up and losing too much fluid. Follow these instructions at home: Watch your symptoms for any changes. Tell your doctor about them. Follow these instructions as told by your doctor. Eating and drinking      Take an ORS (oral rehydration solution). This is a drink that is sold at pharmacies and stores.  Drink clear fluids in small amounts as you are able. These include: ? Water. ? Ice chips. ? Fruit juice that has water added (diluted fruit juice). ? Low-calorie sports drinks.  Eat bland, easy-to-digest foods in small amounts as you are able, such as: ? Bananas. ? Applesauce. ? Rice. ? Low-fat (lean) meats. ? Toast. ? Crackers.  Avoid drinking fluids that have a lot of sugar or caffeine in them. This includes energy drinks, sports drinks, and soda.  Avoid alcohol.  Avoid spicy or fatty foods. General instructions  Take over-the-counter and prescription medicines only as told by your doctor.  Rest at home while you get better.  Drink enough fluid to keep your pee (urine) pale yellow.  Take slow and deep breaths when you feel  sick to your stomach.  Avoid food or things that have strong smells.  Wash your hands often with soap and water. If you cannot use soap and water, use hand sanitizer.  Make sure that all people in your home wash their hands well and often.  Keep all follow-up visits as told by your doctor. This is important. Contact a doctor if:  You feel sicker to your stomach.  You feel sick to your stomach for more than 2 days.  You throw up.  You are not able to drink fluids without throwing up.  You have new symptoms.  You have a fever.  You have a headache.  You have muscle cramps.  You have a rash.  You have pain while peeing.  You feel light-headed or dizzy. Get help right away if:  You have pain in your chest, neck, arm, or jaw.  You feel very weak or you pass out (faint).  You have throw up that is bright red or looks like coffee grounds.  You have bloody or black poop (stools) or poop that looks like tar.  You have a very bad headache, a stiff neck, or both.  You have very bad pain, cramping, or bloating in your belly (abdomen).  You have trouble breathing or you are breathing very quickly.  Your heart is beating very quickly.  Your skin feels cold and clammy.  You feel confused.    You have signs of losing too much water in your body, such as: ? Dark pee, very little pee, or no pee. ? Cracked lips. ? Dry mouth. ? Sunken eyes. ? Sleepiness. ? Weakness. These symptoms may be an emergency. Do not wait to see if the symptoms will go away. Get medical help right away. Call your local emergency services (911 in the [place] Do not drive yourself to the hospital. Summary  Nausea is feeling sick to your stomach or feeling that you are about to throw up (vomit).  If you throw up, or if you are not able to drink enough fluids, there is a risk that you may lose too much water in your body (get dehydrated).  Eat and drink what your doctor tells you. Take  over-the-counter and prescription medicines only as told by your doctor.  Contact a doctor right away if your symptoms get worse or you have new symptoms.  Keep all follow-up visits as told by your doctor. This is important. This information is not intended to replace advice given to you by your health care provider. Make sure you discuss any questions you have with your health care provider. Document Released: 12/04/2011 Document Revised: 05/25/2018 Document Reviewed: 05/25/2018 Elsevier Interactive Patient Education  2019 [employer].  

## 2019-02-15 NOTE — Progress Notes (Signed)
Assessment & Plan:  Lori Mays was seen today for follow-up.  Diagnoses and all orders for this visit:  Nausea -     ondansetron (ZOFRAN ODT) 4 MG disintegrating tablet; Take 1 tablet (4 mg total) by mouth every 8 (eight) hours as needed for up to 30 days for nausea or vomiting.  BV (bacterial vaginosis) -     metroNIDAZOLE (FLAGYL) 500 MG tablet; Take 1 tablet (500 mg total) by mouth 2 (two) times daily. COURTESY FILL/TRANSFERRED FROM [facility]  Deep vein thrombosis (DVT) of other vein of left lower extremity, unspecified chronicity (HCC) -     VAS [place] LOWER EXTREMITY VENOUS (DVT); Future     Patient has been counseled on age-appropriate routine health concerns for screening and prevention. These are reviewed and up-to-date. Referrals have been placed accordingly. Immunizations are up-to-date or declined.    Subjective:   Chief Complaint  Patient presents with  . Follow-up    Pt. is here for follow-up on deep vein thrombosis. Pt. stated she would like to take something for nauseous.   HPI Lori Mays 33 y.o. female presents to office today for follow up to DVT and she also has complaints of nausea.   Nausea Onset 2 months ago with worsening nausea over the past few weeks. Aggravating factors: "unsure". Relieving factors: ginger. Duration: a few hours. She denies any abdominal pain, hematemesis, nausea or burning. Nausea occurring 3-4 days out of the week.    DVT: Patient complains of swelling and pain of theleft leg(s). Symptoms have been present for a few  week(s) She has had similar problems in the past. The patient is able to ambulate. Risk factors for hypercoaguable state inlcude:  prior venous thromboembolism.     Review of Systems  Constitutional: Negative for fever, malaise/fatigue and weight loss.  HENT: Negative.  Negative for nosebleeds.   Eyes: Negative.  Negative for blurred vision, double vision and photophobia.  Respiratory: Negative.  Negative for  cough and shortness of breath.   Cardiovascular: Positive for leg swelling. Negative for chest pain and palpitations.  Gastrointestinal: Positive for nausea. Negative for abdominal pain, blood in stool, constipation, diarrhea, heartburn, melena and vomiting.  Musculoskeletal: Negative.  Negative for myalgias.  Neurological: Negative.  Negative for dizziness, focal weakness, seizures and headaches.  Psychiatric/Behavioral: Negative.  Negative for suicidal ideas.    Past Medical History:  Diagnosis Date  . Asthma   . Cyst 06/07/2012  . DVT, lower extremity, proximal (HCC)   . Endometriosis     Past Surgical History:  Procedure Laterality Date  . laproscopy  2 years ago    Family History  Problem Relation Age of Onset  . Cancer Maternal Grandmother   . Cancer Paternal Grandmother     Social History Reviewed with no changes to be made today.   Outpatient Medications Prior to Visit  Medication Sig Dispense Refill  . albuterol (PROVENTIL HFA;VENTOLIN HFA) 108 (90 Base) MCG/ACT inhaler Inhale 2 puffs into the lungs every 6 (six) hours as needed for wheezing or shortness of breath. 1 Inhaler 0  . rivaroxaban (XARELTO) 20 MG TABS tablet Take 1 tablet (20 mg total) by mouth daily with supper. 30 tablet 0  . metroNIDAZOLE (FLAGYL) 500 MG tablet Take 1 tablet (500 mg total) by mouth 2 (two) times daily. 14 tablet 0  . ondansetron (ZOFRAN ODT) 4 MG disintegrating tablet Take 1 tablet (4 mg total) by mouth every 8 (eight) hours as needed for nausea or vomiting. (Patient not  taking: Reported on 01/05/2019) 9 tablet 0   No facility-administered medications prior to visit.     Allergies  Allergen Reactions  . Diphenhydramine Hcl     Asthma attacks  . Latex Itching       Objective:    BP 110/74 (BP Location: Left Arm, Patient Position: Sitting, Cuff Size: Normal)   Pulse 66   Temp 99.2 F (37.3 C) (Oral)   Ht 5\' 8"  (1.727 m)   Wt 151 lb 12.8 oz (68.9 kg)   SpO2 97%   BMI 23.08  kg/m  Wt Readings from Last 3 Encounters:  02/15/19 151 lb 12.8 oz (68.9 kg)  01/05/19 156 lb (70.8 kg)  01/04/19 155 lb (70.3 kg)    Physical Exam Vitals signs and nursing note reviewed.  Constitutional:      Appearance: She is well-developed.  HENT:     Head: Normocephalic and atraumatic.  Neck:     Musculoskeletal: Normal range of motion.  Cardiovascular:     Rate and Rhythm: Normal rate and regular rhythm.     Heart sounds: Normal heart sounds. No murmur. No friction rub. No gallop.   Pulmonary:     Effort: Pulmonary effort is normal. No tachypnea or respiratory distress.     Breath sounds: Normal breath sounds. No decreased breath sounds, wheezing, rhonchi or rales.  Chest:     Chest wall: No tenderness.  Abdominal:     General: Bowel sounds are normal.     Palpations: Abdomen is soft.     Tenderness: There is no abdominal tenderness.  Musculoskeletal: Normal range of motion.       Legs:  Skin:    General: Skin is warm and dry.  Neurological:     Mental Status: She is alert and oriented to person, place, and time.     Coordination: Coordination normal.  Psychiatric:        Behavior: Behavior normal. Behavior is cooperative.        Thought Content: Thought content normal.        Judgment: Judgment normal.          Patient has been counseled extensively about nutrition and exercise as well as the importance of adherence with medications and regular follow-up. The patient was given clear instructions to go to ER or return to medical center if symptoms don't improve, worsen or new problems develop. The patient verbalized understanding.   Follow-up: Return in about 4 weeks (around 03/15/2019) for nausea; please give financial asisstance forms.   Claiborne Rigg, FNP-BC [facility] [place], [place] [phone]   02/15/2019, 2:24 PM

## 2019-02-16 ENCOUNTER — Telehealth: Payer: Self-pay | Admitting: General Practice

## 2019-02-16 ENCOUNTER — Ambulatory Visit (HOSPITAL_COMMUNITY)
Admission: RE | Admit: 2019-02-16 | Discharge: 2019-02-16 | Disposition: A | Discharge status: Home / Self Care | Payer: Self-pay | Source: Ambulatory Visit | Attending: Nurse Practitioner | Admitting: Nurse Practitioner

## 2019-02-16 DIAGNOSIS — I82492 Acute embolism and thrombosis of other specified deep vein of left lower extremity: Secondary | ICD-10-CM | POA: Insufficient documentation

## 2019-02-16 LAB — CMP14+EGFR
ALT: 13 IU/L (ref 0–32)
AST: 8 IU/L (ref 0–40)
Albumin/Globulin Ratio: 1.7 (ref 1.2–2.2)
Albumin: 4.5 g/dL (ref 3.8–4.8)
Alkaline Phosphatase: 55 IU/L (ref 39–117)
BUN/Creatinine Ratio: 11 (ref 9–23)
BUN: 8 mg/dL (ref 6–20)
Bilirubin Total: 0.6 mg/dL (ref 0.0–1.2)
CO2: 21 mmol/L (ref 20–29)
Calcium: 9.5 mg/dL (ref 8.7–10.2)
Chloride: 103 mmol/L (ref 96–106)
Creatinine, Ser: 0.75 mg/dL (ref 0.57–1.00)
GFR calc Af Amer: 122 mL/min/{1.73_m2} (ref >59)
GFR, EST NON AFRICAN AMERICAN: 106 mL/min/{1.73_m2} (ref >59)
Globulin, Total: 2.6 g/dL (ref 1.5–4.5)
Glucose: 87 mg/dL (ref 65–99)
Potassium: 4.3 mmol/L (ref 3.5–5.2)
Sodium: 138 mmol/L (ref 134–144)
Total Protein: 7.1 g/dL (ref 6.0–8.5)

## 2019-02-16 NOTE — Telephone Encounter (Signed)
Triage nurse received call from [facility] at [facility] to update [facility] patient Lori Mays's results of her vascular study.  Patient at this time is negative for DVT in the left leg.  Nursing then asked for instructions as to her anti coagulant regiment.  Patient, who was on speaker phone was advised that information would be passed to Doctor and that she would receive a call back from [facility].

## 2019-02-16 NOTE — Telephone Encounter (Signed)
Please inform her of negative DV T study.  She needs to continue her Xarelto due to diagnosis of DVT in 12/2018 and keep her follow-up appointment with her PCP at which time duration of anticoagulation will be discussed.

## 2019-02-16 NOTE — Progress Notes (Signed)
Left lower extremity venous duplex completed. Refer to "CV Proc" under chart review to view preliminary results.  02/16/2019 11:01 AM Gertie Fey, MHA, RVT, RDCS, RDMS

## 2019-02-16 NOTE — Telephone Encounter (Signed)
Vascular lab called asking for a nurse.

## 2019-02-17 ENCOUNTER — Telehealth: Payer: Self-pay | Admitting: Emergency Medicine

## 2019-02-17 NOTE — Telephone Encounter (Signed)
Note created in error.

## 2019-02-17 NOTE — Telephone Encounter (Signed)
Patient called and informed of her negative DVT result.  Patient told to continue Xarelto. Patient informed of next office visit.

## 2019-03-04 ENCOUNTER — Other Ambulatory Visit: Payer: Self-pay | Admitting: Nurse Practitioner

## 2019-03-08 MED FILL — XARELTO 20 MG TABLET: 20 | 30 days supply | Qty: 30 | Fill #0

## 2019-03-16 ENCOUNTER — Other Ambulatory Visit: Payer: Self-pay | Admitting: Nurse Practitioner

## 2019-03-16 DIAGNOSIS — J4520 Mild intermittent asthma, uncomplicated: Secondary | ICD-10-CM

## 2019-03-18 ENCOUNTER — Telehealth: Payer: Self-pay | Admitting: General Practice

## 2019-03-18 NOTE — Telephone Encounter (Signed)
Patient called because she would like to know if she has to stay on the blood thinners. Please follow up.

## 2019-03-18 NOTE — Telephone Encounter (Signed)
CMA spoke to patient to inform she need to continue to be on blood thinner until she establish care with primary care.   Pt. Understood.

## 2019-03-21 ENCOUNTER — Ambulatory Visit: Payer: Self-pay | Admitting: Nurse Practitioner

## 2019-03-30 ENCOUNTER — Telehealth: Payer: Self-pay | Admitting: *Deleted

## 2019-03-30 NOTE — Telephone Encounter (Signed)
Lori Mays left a voice message this pm that she is having some residual pain after her period and also may have BV. Would like a call.

## 2019-03-31 NOTE — Telephone Encounter (Signed)
Called mobile phone and left message I am returning your call- please call us back and leave detailed message of your symptoms so that we may assist you. Called home number and unable to leave a message.

## 2019-04-09 ENCOUNTER — Other Ambulatory Visit: Payer: Self-pay | Admitting: Nurse Practitioner

## 2019-04-09 MED FILL — !VENTOLIN HFA INHALER: 108 (90 BAS | 25 days supply | Qty: 18 | Fill #0

## 2019-04-11 MED FILL — XARELTO 20 MG TABLET: 20 | 30 days supply | Qty: 30 | Fill #0

## 2019-04-20 ENCOUNTER — Telehealth: Payer: Self-pay | Admitting: Obstetrics and Gynecology

## 2019-04-20 MED ORDER — METRONIDAZOLE 500 MG PO TABS: 500.0000 mg | ORAL_TABLET | Freq: Two times a day (BID) | ORAL | 0 refills | Status: DC

## 2019-04-20 MED ORDER — FLUCONAZOLE 150 MG PO TABS: 150.0000 mg | ORAL_TABLET | Freq: Once | ORAL | 0 refills | Status: AC

## 2019-04-20 MED FILL — FLUCONAZOLE 150 MG TABS: 150 | 1 days supply | Qty: 1 | Fill #0

## 2019-04-20 MED FILL — metroNIDAZOLE 500 MG TABS: 500 | 7 days supply | Qty: 14 | Fill #0

## 2019-04-20 NOTE — Telephone Encounter (Signed)
The patient stated she is experiencing a discharge that is thicker than normal and stomach pain. She feels like she may have a BV. Would like to speak with a nurse

## 2019-04-20 NOTE — Telephone Encounter (Signed)
Called pt and pt reports that she is having a thick discharge with some odor but it is not fishy.  Per Dr. Vergie Living pt can have an order for Flagyl and Diflucan.  Notified pt providers recommendation and that it will be sent to [facility].  Pt verbalized understanding with no further questions.

## 2019-05-03 ENCOUNTER — Ambulatory Visit: Payer: Self-pay | Attending: Nurse Practitioner | Admitting: Nurse Practitioner

## 2019-05-03 ENCOUNTER — Encounter: Payer: Self-pay | Admitting: Nurse Practitioner

## 2019-05-03 ENCOUNTER — Other Ambulatory Visit: Payer: Self-pay

## 2019-05-03 DIAGNOSIS — N809 Endometriosis, unspecified: Secondary | ICD-10-CM

## 2019-05-03 DIAGNOSIS — Z86718 Personal history of other venous thrombosis and embolism: Secondary | ICD-10-CM

## 2019-05-03 DIAGNOSIS — E559 Vitamin D deficiency, unspecified: Secondary | ICD-10-CM

## 2019-05-03 NOTE — Progress Notes (Signed)
Virtual Visit via Telephone Note Due to national recommendations of social distancing due to COVID 19, telehealth visit is felt to be most appropriate for this patient at this time.  I discussed the limitations, risks, security and privacy concerns of performing an evaluation and management service by telephone and the availability of in person appointments. I also discussed with the patient that there may be a patient responsible charge related to this service. The patient expressed understanding and agreed to proceed.    I connected with Ralene Ok on 05/03/19  at   1:30 PM EDT  EDT by telephone and verified that I am speaking with the correct person using two identifiers.   Consent I discussed the limitations, risks, security and privacy concerns of performing an evaluation and management service by telephone and the availability of in person appointments. I also discussed with the patient that there may be a patient responsible charge related to this service. The patient expressed understanding and agreed to proceed.   Location of Patient: Private  Residence   Location of Provider: [facility]    Persons participating in Telemedicine visit: Bertram Denver FNP-BC YY Fisk CMA Ralene Ok    History of Present Illness: Telemedicine visit for: Nausea   Nausea She endorses symptoms of nausea but no vomiting since she started taking Xarelto. She was diagnosed with an unprovoked DVT 01-03-2019.  BMI 23, non smoker, no OCPs, no cardiac history. She states there is no nausea present when she does not take xarelto at her scheduled time. However within an hour after taking xarelto she becomes nauseous. Nausea can last up to a few hours then will subside.   Past Medical History:  Diagnosis Date  . Asthma   . Cyst 06/07/2012  . DVT, lower extremity, proximal (HCC)   . Endometriosis     Past Surgical History:  Procedure Laterality Date  . laproscopy   2 years ago    Family History  Problem Relation Age of Onset  . Cancer Maternal Grandmother   . Cancer Paternal Grandmother     Social History   Socioeconomic History  . Marital status: Single    Spouse name: Not on file  . Number of children: Not on file  . Years of education: Not on file  . Highest education level: Not on file  Occupational History  . Not on file  Social Needs  . Financial resource strain: Not on file  . Food insecurity:    Worry: Not on file    Inability: Not on file  . Transportation needs:    Medical: Not on file    Non-medical: Not on file  Tobacco Use  . Smoking status: Never Smoker  . Smokeless tobacco: Never Used  Substance and Sexual Activity  . Alcohol use: Yes    Alcohol/week: 1.0 - 2.0 standard drinks    Types: 1 - 2 Glasses of wine per week  . Drug use: No  . Sexual activity: Not Currently    Partners: Male  Lifestyle  . Physical activity:    Days per week: Not on file    Minutes per session: Not on file  . Stress: Not on file  Relationships  . Social connections:    Talks on phone: Not on file    Gets together: Not on file    Attends religious service: Not on file    Active member of club or organization: Not on file    Attends meetings of clubs  or organizations: Not on file    Relationship status: Not on file  Other Topics Concern  . Not on file  Social History Narrative  . Not on file     Observations/Objective: Awake, alert and oriented x 3   Review of Systems  Constitutional: Negative for fever, malaise/fatigue and weight loss.  HENT: Negative.  Negative for nosebleeds.   Eyes: Negative.  Negative for blurred vision, double vision and photophobia.  Respiratory: Negative.  Negative for cough and shortness of breath.   Cardiovascular: Negative.  Negative for chest pain, palpitations and leg swelling.  Gastrointestinal: Positive for nausea. Negative for abdominal pain, blood in stool, constipation, diarrhea, heartburn,  melena and vomiting.  Musculoskeletal: Negative.  Negative for myalgias.  Neurological: Negative.  Negative for dizziness, focal weakness, seizures and headaches.  Psychiatric/Behavioral: Negative.  Negative for suicidal ideas.    Assessment and Plan:  Diagnoses and all orders for this visit:  History of DVT (deep vein thrombosis) -     D-dim[facility], quantitative ([facility] STOP XARELTO Patient has been on xarelto for almost 4 months. Will discontinue at this time. Ddimer will be obtained in a few weeks.  Will order hypercoag panel based on results.  May need Heme referral.   Endometriosis -     Ambulatory referral to Gynecology Endorses recurring pain.   Vitamin D deficiency disease -     VITAMIN D 25 Hydroxy (Vit-D Deficiency, Fractures)     Follow Up Instructions Return in about 2 weeks (around 05/17/2019) for DDIMER.     I discussed the assessment and treatment plan with the patient. The patient was provided an opportunity to ask questions and all were answered. The patient agreed with the plan and demonstrated an understanding of the instructions.   The patient was advised to call back or seek an in-person evaluation if the symptoms worsen or if the condition fails to improve as anticipated.  I provided 28 minutes of non-face-to-face time during this encounter including median intraservice time, reviewing previous notes, labs, imaging, medications and explaining diagnosis and management.  Claiborne RiggZelda W Fleming, FNP-BC

## 2019-05-10 ENCOUNTER — Other Ambulatory Visit: Payer: Self-pay

## 2019-05-20 ENCOUNTER — Ambulatory Visit: Payer: Self-pay | Attending: Nurse Practitioner

## 2019-05-20 ENCOUNTER — Other Ambulatory Visit: Payer: Self-pay

## 2019-05-20 DIAGNOSIS — R300 Dysuria: Secondary | ICD-10-CM

## 2019-05-20 LAB — POCT URINALYSIS DIP (CLINITEK)
Bilirubin, UA: NEGATIVE
Blood, UA: NEGATIVE
Glucose, UA: NEGATIVE mg/dL
Ketones, POC UA: NEGATIVE mg/dL
Leukocytes, UA: NEGATIVE
Nitrite, UA: NEGATIVE
POC PROTEIN,UA: NEGATIVE
Spec Grav, UA: 1.015 (ref 1.010–1.025)
Urobilinogen, UA: 0.2 E.U./dL
pH, UA: 5 (ref 5.0–8.0)

## 2019-05-21 LAB — VITAMIN D 25 HYDROXY (VIT D DEFICIENCY, FRACTURES): Vit D, 25-Hydroxy: 9.7 ng/mL — ABNORMAL LOW (ref 30.0–100.0)

## 2019-05-21 LAB — D-DIMER, QUANTITATIVE (NOT AT ARMC): D-DIMER: 0.62 mg/L FEU — ABNORMAL HIGH (ref 0.00–0.49)

## 2019-05-23 ENCOUNTER — Other Ambulatory Visit: Payer: Self-pay | Admitting: Nurse Practitioner

## 2019-05-23 DIAGNOSIS — Z86718 Personal history of other venous thrombosis and embolism: Secondary | ICD-10-CM

## 2019-05-23 MED ORDER — VITAMIN D (ERGOCALCIFEROL) 1.25 MG (50000 UNIT) PO CAPS: 50000.0000 [IU] | ORAL_CAPSULE | ORAL | 1 refills | Status: DC

## 2019-05-24 LAB — URINE CYTOLOGY ANCILLARY ONLY
Chlamydia: NEGATIVE
Neisseria Gonorrhea: NEGATIVE
Trichomonas: NEGATIVE

## 2019-05-26 LAB — URINE CYTOLOGY ANCILLARY ONLY
Bacterial vaginitis: POSITIVE — AB
Candida vaginitis: NEGATIVE

## 2019-05-31 ENCOUNTER — Other Ambulatory Visit: Payer: Self-pay | Admitting: Nurse Practitioner

## 2019-05-31 MED ORDER — METRONIDAZOLE 500 MG PO TABS: 500.0000 mg | ORAL_TABLET | Freq: Two times a day (BID) | ORAL | 0 refills | Status: AC

## 2019-06-21 ENCOUNTER — Telehealth: Payer: Self-pay | Admitting: Obstetrics & Gynecology

## 2019-06-21 NOTE — Telephone Encounter (Signed)
Patient was called and informed about her visit being a MyChart visit. I asked her if she had the MyChart app because she was going to need this for her visit. She stated she had it because she was getting messages. I explained the app was different. She said she would look later to see if she had the app.

## 2019-06-22 ENCOUNTER — Other Ambulatory Visit: Payer: Self-pay

## 2019-06-22 ENCOUNTER — Telehealth (INDEPENDENT_AMBULATORY_CARE_PROVIDER_SITE_OTHER): Payer: Self-pay | Admitting: Obstetrics & Gynecology

## 2019-06-22 ENCOUNTER — Encounter: Payer: Self-pay | Admitting: Obstetrics & Gynecology

## 2019-06-22 DIAGNOSIS — R102 Pelvic and perineal pain unspecified side: Secondary | ICD-10-CM

## 2019-06-22 NOTE — Progress Notes (Signed)
Pt has been having abdominal pain for 2 weeks now. In April stted had d/c & was sent per [place].

## 2019-06-22 NOTE — Patient Instructions (Signed)
Return to clinic for any scheduled appointments or for any gynecologic concerns as needed.   

## 2019-06-22 NOTE — Progress Notes (Signed)
    TELEHEALTH GYNECOLOGY VIRTUAL VIDEO VISIT ENCOUNTER NOTE  Provider location: [facility]   I connected with Lori Mays on 06/22/19 at  3:35 PM EDT by MyChart Video Encounter at home and verified that I am speaking with the correct person using two identifiers.   I discussed the limitations, risks, security and privacy concerns of performing an evaluation and management service by telephone and the availability of in person appointments. I also discussed with the patient that there may be a patient responsible charge related to this service. The patient expressed understanding and agreed to proceed.   History:  Lori Mays is a 33 y.o. G0P0000 female being evaluated today for pelvic pain x 2 weeks. Feels lower abdominal pain, and pain in upper vagina underneath her bladder opening. No pain with urination or burning. No fevers. No GI/GU symptoms reported. She denies any abnormal vaginal discharge, bleeding or other concerns.       Past Medical History:  Diagnosis Date  . Asthma   . Cyst 06/07/2012  . DVT, lower extremity, proximal ([place])   . Endometriosis    Past Surgical History:  Procedure Laterality Date  . laproscopy  2 years ago   The following portions of the patient's history were reviewed and updated as appropriate: allergies, current medications, past family history, past medical history, past social history, past surgical history and problem list.   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     Review of Systems:  Pertinent items noted in HPI and remainder of comprehensive ROS otherwise negative.  Physical Exam:   General:  Alert, oriented and cooperative. Patient appears to be in no acute distress.  Mental Status: Normal mood and affect. Normal behavior. Normal judgment and thought content.   Respiratory: Normal respiratory effort, no problems with respiration noted  Rest of physical exam deferred due to  type of encounter       Assessment and Plan:     1. Pelvic pain Patient will come in to be evaluated with urine analysis and vaginal swab.  will follow up results and manage accordingly. - Urinalysis, Routine w reflex microscopic; Future - Urine Culture; Future - Cervicovaginal ancillary only; Future Needs repeat pap smear given positive HPV in 2018, will need BCCCP referral given her insurance status.      I discussed the assessment and treatment plan with the patient. The patient was provided an opportunity to ask questions and all were answered. The patient agreed with the plan and demonstrated an understanding of the instructions.   The patient was advised to call back or seek an in-person evaluation/go to the ED if the symptoms worsen or if the condition fails to improve as anticipated.  I provided 15 minutes of face-to-face time during this encounter.   Verita Schneiders, MD [facility], [place]

## 2019-06-23 ENCOUNTER — Ambulatory Visit (INDEPENDENT_AMBULATORY_CARE_PROVIDER_SITE_OTHER): Payer: Self-pay | Admitting: Lactation Services

## 2019-06-23 ENCOUNTER — Other Ambulatory Visit: Payer: Self-pay

## 2019-06-23 DIAGNOSIS — R102 Pelvic and perineal pain: Secondary | ICD-10-CM

## 2019-06-23 DIAGNOSIS — B9689 Other specified bacterial agents as the cause of diseases classified elsewhere: Secondary | ICD-10-CM

## 2019-06-23 DIAGNOSIS — N760 Acute vaginitis: Secondary | ICD-10-CM

## 2019-06-23 DIAGNOSIS — N898 Other specified noninflammatory disorders of vagina: Secondary | ICD-10-CM

## 2019-06-23 DIAGNOSIS — Z113 Encounter for screening for infections with a predominantly sexual mode of transmission: Secondary | ICD-10-CM

## 2019-06-23 LAB — POCT URINALYSIS DIP (DEVICE)
Bilirubin Urine: NEGATIVE
Glucose, UA: NEGATIVE mg/dL
Hgb urine dipstick: NEGATIVE
Ketones, ur: NEGATIVE mg/dL
Leukocytes,Ua: NEGATIVE
Nitrite: NEGATIVE
Protein, ur: NEGATIVE mg/dL
Specific Gravity, Urine: 1.025 (ref 1.005–1.030)
Urobilinogen, UA: 0.2 mg/dL (ref 0.0–1.0)
pH: 5.5 (ref 5.0–8.0)

## 2019-06-23 NOTE — Progress Notes (Signed)
Irritation to her vagina. She had BV and then her period and then feels like BV is back. She denies any discharge. She has cramping to abdomen and is taking AZO and the cramping is intermittent.   Urine collected for UA and culture. Self Swab obtained. Info rmed pt she would be notified if results are abnormal. Pt voiced understanding.

## 2019-06-24 LAB — URINALYSIS, ROUTINE W REFLEX MICROSCOPIC
Bilirubin, UA: NEGATIVE
Glucose, UA: NEGATIVE
Ketones, UA: NEGATIVE
Leukocytes,UA: NEGATIVE
Nitrite, UA: NEGATIVE
Protein,UA: NEGATIVE
RBC, UA: NEGATIVE
Specific Gravity, UA: 1.016 (ref 1.005–1.030)
Urobilinogen, Ur: 0.2 mg/dL (ref 0.2–1.0)
pH, UA: 5 (ref 5.0–7.5)

## 2019-06-24 LAB — CERVICOVAGINAL ANCILLARY ONLY
Bacterial vaginitis: POSITIVE — AB
Candida vaginitis: NEGATIVE
Chlamydia: NEGATIVE
Neisseria Gonorrhea: NEGATIVE
Trichomonas: NEGATIVE

## 2019-06-25 LAB — URINE CULTURE: Organism ID, Bacteria: NO GROWTH

## 2019-06-27 ENCOUNTER — Other Ambulatory Visit: Payer: Self-pay | Admitting: Obstetrics & Gynecology

## 2019-06-27 DIAGNOSIS — N760 Acute vaginitis: Secondary | ICD-10-CM

## 2019-06-27 DIAGNOSIS — B9689 Other specified bacterial agents as the cause of diseases classified elsewhere: Secondary | ICD-10-CM

## 2019-06-27 MED ORDER — METRONIDAZOLE 500 MG PO TABS: 500.0000 mg | ORAL_TABLET | Freq: Two times a day (BID) | ORAL | 0 refills | Status: DC

## 2019-06-27 MED FILL — metroNIDAZOLE 500 MG TABS: 500 | 7 days supply | Qty: 14 | Fill #0

## 2019-07-11 MED FILL — VIT D2 1.25 MG (50,000 UNIT: 1.25 MG | 84 days supply | Qty: 12 | Fill #0

## 2019-07-11 MED FILL — metroNIDAZOLE 500 MG TABS: 500 | 7 days supply | Qty: 14 | Fill #0

## 2019-08-09 ENCOUNTER — Ambulatory Visit: Payer: Self-pay | Attending: Nurse Practitioner | Admitting: Nurse Practitioner

## 2019-08-09 ENCOUNTER — Encounter: Payer: Self-pay | Admitting: Nurse Practitioner

## 2019-08-09 DIAGNOSIS — N760 Acute vaginitis: Secondary | ICD-10-CM

## 2019-08-09 DIAGNOSIS — R102 Pelvic and perineal pain: Secondary | ICD-10-CM

## 2019-08-09 DIAGNOSIS — M79605 Pain in left leg: Secondary | ICD-10-CM

## 2019-08-09 DIAGNOSIS — N809 Endometriosis, unspecified: Secondary | ICD-10-CM

## 2019-08-09 DIAGNOSIS — K5909 Other constipation: Secondary | ICD-10-CM

## 2019-08-09 MED ORDER — POLYETHYLENE GLYCOL 3350 17 GM/SCOOP PO POWD: 17.0000 g | Freq: Two times a day (BID) | ORAL | 1 refills | Status: DC | PRN

## 2019-08-09 MED FILL — POLYETHYLENE GLYCOL 3350 PO: 17 | 7 days supply | Qty: 238 | Fill #0

## 2019-08-09 NOTE — Progress Notes (Signed)
Virtual Visit via Telephone Note Due to national recommendations of social distancing due to COVID 19, telehealth visit is felt to be most appropriate for this patient at this time.  I discussed the limitations, risks, security and privacy concerns of performing an evaluation and management service by telephone and the availability of in person appointments. I also discussed with the patient that there may be a patient responsible charge related to this service. The patient expressed understanding and agreed to proceed.    I connected with Lori Mays on 08/09/19  at   1:50 PM EDT  EDT by telephone and verified that I am speaking with the correct person using two identifiers.   Consent I discussed the limitations, risks, security and privacy concerns of performing an evaluation and management service by telephone and the availability of in person appointments. I also discussed with the patient that there may be a patient responsible charge related to this service. The patient expressed understanding and agreed to proceed.   Location of Patient: Private Residence   [facility] Provider[facility]    Persons participating in Telemedicine visit: Bertram DenverZelda Fleming FNP-BC YY GilmanBien CMA Lori OkVenecia Vandevender    History of Present Illness: Telemedicine visit for:    Left Leg pain She has a history of LLE DVT. Currently with complains of swelling and pain of theleft leg and ankle. Symptoms have been present for 1 week(s) She has had similar problems in the past. The patient is able to ambulate. Risk factors for hypercoaguable state inlcude:  prior venous thromboembolism. She denies any trauma or injury.    GU Problem Lower pelvic pain with abdominal distension but she also endorses chronic constipation. She has a history of endometriosis as well. Last visit with GYN 06-22-2019. Will order pelvic US due to history of endometriosis. She may need to be evaluated by GYN  again.  She also has complaints of possible recurrent BV. Denies any new sexual partners or any partner within the last 6 months. Endorses malodorous thick discharge. No local irritation or vulvar itching.   Constipation Questioning if she has IBS and needs to be evaluated by GI. I have instructed her to try miralax first and if this is not effective after a few weeks we will discuss further treatment. She denies any nausea or vomiting.  May need.    Past Medical History:  Diagnosis Date  . Asthma   . Cyst 06/07/2012  . DVT, lower extremity, proximal (HCC)   . Endometriosis     Past Surgical History:  Procedure Laterality Date  . laproscopy  2 years ago    Family History  Problem Relation Age of Onset  . Cancer Maternal Grandmother   . Cancer Paternal Grandmother     Social History   Socioeconomic History  . Marital status: Single    Spouse name: Not on file  . Number of children: Not on file  . Years of education: Not on file  . Highest education level: Not on file  Occupational History  . Not on file  Social Needs  . Financial resource strain: Not on file  . Food insecurity    Worry: Not on file    Inability: Not on file  . Transportation needs    Medical: Not on file    Non-medical: Not on file  Tobacco Use  . Smoking status: Never Smoker  . Smokeless tobacco: Never Used  Substance and Sexual Activity  . Alcohol use: Yes  Alcohol/week: 1.0 - 2.0 standard drinks    Types: 1 - 2 Glasses of wine per week  . Drug use: No  . Sexual activity: Not Currently    Partners: Male  Lifestyle  . Physical activity    Days per week: Not on file    Minutes per session: Not on file  . Stress: Not on file  Relationships  . Social Herbalist on phone: Not on file    Gets together: Not on file    Attends religious service: Not on file    Active member of club or organization: Not on file    Attends meetings of clubs or organizations: Not on file    Relationship  status: Not on file  Other Topics Concern  . Not on file  Social History Narrative  . Not on file     Observations/Objective: Awake, alert and oriented x 3   Review of Systems  Constitutional: Negative for fever, malaise/fatigue and weight loss.  HENT: Negative.  Negative for nosebleeds.   Eyes: Negative.  Negative for blurred vision, double vision and photophobia.  Respiratory: Negative.  Negative for cough and shortness of breath.   Cardiovascular: Negative.  Negative for chest pain, palpitations and leg swelling.  Gastrointestinal: Positive for abdominal pain and constipation. Negative for blood in stool, diarrhea (SEE HPI), heartburn, melena, nausea and vomiting.  Genitourinary: Negative for flank pain, frequency, hematuria and urgency.       SEE HPI  Musculoskeletal: Negative for myalgias.       SEE HPI  Neurological: Negative.  Negative for dizziness, focal weakness, seizures and headaches.  Psychiatric/Behavioral: Negative.  Negative for suicidal ideas.    Assessment and Plan:  Diagnoses and all orders for this visit:  Acute leg pain, left -     VAS [place] LOWER EXTREMITY VENOUS (DVT); Future  Endometriosis -     US PELVIC COMPLETE WITH TRANSVAGINAL; Future  Acute vaginitis -     Cervicovaginal ancillary only; Future  Pelvic pain -     POCT URINALYSIS DIP (CLINITEK); Future She has a PAP already scheduled with GYN in October  Chronic constipation -     polyethylene glycol powder (GLYCOLAX/MIRALAX) 17 GM/SCOOP powder; Take 17 g by mouth 2 (two) times daily as needed.     Follow Up Instructions Return if symptoms worsen or fail to improve.     I discussed the assessment and treatment plan with the patient. The patient was provided an opportunity to ask questions and all were answered. The patient agreed with the plan and demonstrated an understanding of the instructions.   The patient was advised to call back or seek an in-person evaluation if the symptoms worsen  or if the condition fails to improve as anticipated.  I provided 18 minutes of non-face-to-face time during this encounter including median intraservice time, reviewing previous notes, labs, imaging, medications and explaining diagnosis and management.  Gildardo Pounds, FNP-BC

## 2019-08-10 ENCOUNTER — Ambulatory Visit: Payer: Self-pay | Attending: Nurse Practitioner

## 2019-08-10 ENCOUNTER — Other Ambulatory Visit: Payer: Self-pay

## 2019-08-10 ENCOUNTER — Ambulatory Visit (HOSPITAL_COMMUNITY)
Admission: RE | Admit: 2019-08-10 | Discharge: 2019-08-10 | Disposition: A | Discharge status: Home / Self Care | Payer: Self-pay | Source: Ambulatory Visit | Attending: Nurse Practitioner | Admitting: Nurse Practitioner

## 2019-08-10 DIAGNOSIS — M79605 Pain in left leg: Secondary | ICD-10-CM | POA: Insufficient documentation

## 2019-08-10 DIAGNOSIS — N760 Acute vaginitis: Secondary | ICD-10-CM

## 2019-08-10 DIAGNOSIS — R102 Pelvic and perineal pain: Secondary | ICD-10-CM

## 2019-08-10 LAB — POCT URINALYSIS DIP (CLINITEK)
Bilirubin, UA: NEGATIVE
Blood, UA: NEGATIVE
Glucose, UA: NEGATIVE mg/dL
Ketones, POC UA: NEGATIVE mg/dL
Leukocytes, UA: NEGATIVE
Nitrite, UA: NEGATIVE
POC PROTEIN,UA: NEGATIVE
Spec Grav, UA: <=1.005 — AB (ref 1.010–1.025)
Urobilinogen, UA: 1 E.U./dL
pH, UA: 6 (ref 5.0–8.0)

## 2019-08-10 NOTE — Progress Notes (Signed)
Left lower ext venous  has been completed. Refer to [facility] under chart review to view preliminary results.   08/10/2019  10:34 AM Lyonel Morejon, Bonnye Fava

## 2019-08-11 ENCOUNTER — Ambulatory Visit (HOSPITAL_COMMUNITY)
Admission: RE | Admit: 2019-08-11 | Discharge: 2019-08-11 | Disposition: A | Discharge status: Home / Self Care | Payer: Self-pay | Source: Ambulatory Visit | Attending: Nurse Practitioner | Admitting: Nurse Practitioner

## 2019-08-11 ENCOUNTER — Other Ambulatory Visit: Payer: Self-pay

## 2019-08-11 DIAGNOSIS — N809 Endometriosis, unspecified: Secondary | ICD-10-CM | POA: Insufficient documentation

## 2019-08-12 LAB — CERVICOVAGINAL ANCILLARY ONLY
Bacterial vaginitis: POSITIVE — AB
Candida vaginitis: NEGATIVE
Chlamydia: NEGATIVE
Neisseria Gonorrhea: NEGATIVE
Trichomonas: NEGATIVE

## 2019-08-16 ENCOUNTER — Other Ambulatory Visit: Payer: Self-pay | Admitting: Nurse Practitioner

## 2019-08-16 MED ORDER — METRONIDAZOLE 500 MG PO TABS: 500.0000 mg | ORAL_TABLET | Freq: Two times a day (BID) | ORAL | 0 refills | Status: AC

## 2019-08-16 MED FILL — metroNIDAZOLE 500 MG TABS: 500 | 7 days supply | Qty: 14 | Fill #0

## 2019-08-26 MED FILL — metroNIDAZOLE 500 MG TABS: 500 | 7 days supply | Qty: 14 | Fill #0

## 2019-09-09 ENCOUNTER — Ambulatory Visit (INDEPENDENT_AMBULATORY_CARE_PROVIDER_SITE_OTHER): Payer: Self-pay | Admitting: Obstetrics & Gynecology

## 2019-09-09 ENCOUNTER — Encounter: Payer: Self-pay | Admitting: Obstetrics & Gynecology

## 2019-09-09 ENCOUNTER — Ambulatory Visit: Payer: Self-pay | Admitting: Obstetrics & Gynecology

## 2019-09-09 ENCOUNTER — Other Ambulatory Visit: Payer: Self-pay

## 2019-09-09 VITALS — BP 108/72 | HR 70 | Wt 155.7 lb

## 2019-09-09 DIAGNOSIS — N719 Inflammatory disease of uterus, unspecified: Secondary | ICD-10-CM

## 2019-09-09 DIAGNOSIS — R102 Pelvic and perineal pain: Secondary | ICD-10-CM

## 2019-09-09 DIAGNOSIS — B9689 Other specified bacterial agents as the cause of diseases classified elsewhere: Secondary | ICD-10-CM

## 2019-09-09 DIAGNOSIS — N760 Acute vaginitis: Secondary | ICD-10-CM

## 2019-09-09 DIAGNOSIS — R87810 Cervical high risk human papillomavirus (HPV) DNA test positive: Secondary | ICD-10-CM

## 2019-09-09 DIAGNOSIS — N972 Female infertility of uterine origin: Secondary | ICD-10-CM

## 2019-09-09 DIAGNOSIS — G8929 Other chronic pain: Secondary | ICD-10-CM

## 2019-09-09 DIAGNOSIS — N809 Endometriosis, unspecified: Secondary | ICD-10-CM

## 2019-09-09 MED ORDER — NAPROXEN 500 MG PO TABS: 500.0000 mg | ORAL_TABLET | Freq: Two times a day (BID) | ORAL | 2 refills | Status: DC

## 2019-09-09 MED ORDER — METRONIDAZOLE 500 MG PO TABS: 500.0000 mg | ORAL_TABLET | Freq: Two times a day (BID) | ORAL | 0 refills | Status: DC

## 2019-09-09 MED ORDER — TRAMADOL HCL 50 MG PO TABS: 50.0000 mg | ORAL_TABLET | Freq: Four times a day (QID) | ORAL | 0 refills | Status: DC | PRN

## 2019-09-09 MED FILL — traMADol HCL 50 MG TABS: 50 | 7 days supply | Qty: 60 | Fill #0

## 2019-09-09 MED FILL — NAPROXEN 500 MG TABLET: 500 | 30 days supply | Qty: 60 | Fill #0

## 2019-09-09 MED FILL — metroNIDAZOLE 500 MG TABS: 500 | 7 days supply | Qty: 14 | Fill #0

## 2019-09-09 NOTE — Progress Notes (Signed)
GYNECOLOGY OFFICE VISIT NOTE  History:   Lori Mays is a 33 y.o. G0P0000 here today for worsening pelvic pain with known history of endometriosis.  Pain is constant, currently more on left than right. Alleviated a little with Tylenol and Ibuprofen. Also is constipated occasionally, this is helped by occasional Miralax.  Also feels pain could be related to untreated recent episode of BV, wants medication for this.  She is also stressed because she is trying to conceive but feels she has endometriosis related infertility.  She desires referral to infertility specialist. She denies any abnormal bleeding or other concerns.    Past Medical History:  Diagnosis Date  . Asthma   . Cyst 06/07/2012  . DVT, lower extremity, proximal ([place])   . Endometriosis     Past Surgical History:  Procedure Laterality Date  . laproscopy  2 years ago    The following portions of the patient's history were reviewed and updated as appropriate: allergies, current medications, past family history, past medical history, past social history, past surgical history and problem list.   Health Maintenance:  Normal pap and positive HRHPV on 09/03/2017.  Review of Systems:  Pertinent items noted in HPI and remainder of comprehensive ROS otherwise negative.  Physical Exam:  BP 108/72   Pulse 70   Wt 155 lb 11.2 oz (70.6 kg)   BMI 23.67 kg/m  CONSTITUTIONAL: Well-developed, well-nourished female in no acute distress.  HEENT:  Normocephalic, atraumatic. External right and left ear normal. No scleral icterus.  NECK: Normal range of motion, supple, no masses noted on observation SKIN: No rash noted. Not diaphoretic. No erythema. No pallor. MUSCULOSKELETAL: Normal range of motion. No edema noted. NEUROLOGIC: Alert and oriented to person, place, and time. Normal muscle tone coordination. No cranial nerve deficit noted. PSYCHIATRIC: Normal mood and affect. Normal behavior. Normal judgment and thought  content. CARDIOVASCULAR: Normal heart rate noted RESPIRATORY: Effort and breath sounds normal, no problems with respiration noted ABDOMEN: Moderate lower abdominal and LLQ/left flank tenderness on palpation. No rebound or guarding. No masses noted. No other overt distention noted.   PELVIC: Deferred  Labs and Imaging No results found for this or any previous visit (from the past 168 hour(s)). US Pelvic Complete With Transvaginal  Result Date: 08/11/2019 CLINICAL DATA:  Endometriosis EXAM: TRANSABDOMINAL AND TRANSVAGINAL ULTRASOUND OF PELVIS TECHNIQUE: Both transabdominal and transvaginal ultrasound examinations of the pelvis were performed. Transabdominal technique was performed for global imaging of the pelvis including uterus, ovaries, adnexal regions, and pelvic cul-de-sac. It was necessary to proceed with endovaginal exam following the transabdominal exam to visualize the left ovary. COMPARISON:  None FINDINGS: Uterus Measurements: 6.5 x 2.8 x 4.0 cm = volume: 39 mL. No fibroids or other mass visualized. Endometrium Thickness: 10 mm.  No focal abnormality visualized. Right ovary Measurements: 4.3 x 2.5 x 2.0 cm = volume: 11 mL. Normal appearance/no adnexal mass. Left ovary Measurements: 2.9 x 1.4 x 2.9 cm = volume: 5.7 mL. Normal appearance/no adnexal mass. Other findings Moderate loculated fluid adjacent to the right ovary/adnexa. IMPRESSION: Moderate loculated right adnexal fluid. Otherwise negative pelvic ultrasound. Electronically Signed   By: Julian Hy M.D.   On: 08/11/2019 22:14      Assessment and Plan:     1. Chronic female pelvic pain 2. Endometriosis Patient still has significant pain.  Does not want to be on any hormonal treatment now because she wants to conceive.  Naproxen prescribed for moderate pain, will also continue Tylenol prn mild pain.  Tramadol prescribed for severe pain as needed.  Will monitor response.  If pain worsens, may need further medical or surgical  management.  Of note, not currently concerned about loculated fluid near right ovary, this is not site of her pain as her pain is more on the left than right. - naproxen (NAPROSYN) 500 MG tablet; Take 1 tablet (500 mg total) by mouth 2 (two) times daily with a meal. As needed for pain  Dispense: 60 tablet; Refill: 2 - traMADol (ULTRAM) 50 MG tablet; Take 1-2 tablets (50-100 mg total) by mouth every 6 (six) hours as needed for severe pain.  Dispense: 60 tablet; Refill: 0  3. 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 She was given the option of [facility], [facility] Referral will be sent to them; she was told she will be contacted by their office.  - Ambulatory referral to Infertility  4. BV (bacterial vaginosis) Metronidazole prescribed as per request.  - metroNIDAZOLE (FLAGYL) 500 MG tablet; Take 1 tablet (500 mg total) by mouth 2 (two) times daily.  Dispense: 14 tablet; Refill: 0  5. Cervical high risk HPV (human papillomavirus) test positive On review of records, she had a pap smear with normal cytology but positive HRHPV (16, 18/45 typing not done) on 09/03/2017.  She needs follow up pap smear, unable to afford this given she does not have insurance. [facility]; message sent to Stoney BangSabrina Holland, RN.   Routine preventative health maintenance measures emphasized. Please refer to After Visit Summary for other counseling recommendations.   Return for any gynecologic concerns.    Total face-to-face time with patient: 25 minutes.  Over 50% of encounter was spent on counseling and coordination of care.   Jaynie CollinsUGONNA  Jimmylee Ratterree, MD, FACOG Obstetrician & Gynecologist, [facility], [facility]

## 2019-09-09 NOTE — Patient Instructions (Signed)
Return to clinic for any scheduled appointments or for any gynecologic concerns as needed.   

## 2019-09-21 ENCOUNTER — Encounter (HOSPITAL_COMMUNITY): Payer: Self-pay | Admitting: Emergency Medicine

## 2019-09-21 ENCOUNTER — Other Ambulatory Visit: Payer: Self-pay

## 2019-09-21 ENCOUNTER — Emergency Department (HOSPITAL_COMMUNITY)
Admission: EM | Admit: 2019-09-21 | Discharge: 2019-09-21 | Disposition: A | Discharge status: Home / Self Care | Payer: Self-pay | Attending: Emergency Medicine | Admitting: Emergency Medicine

## 2019-09-21 ENCOUNTER — Emergency Department (HOSPITAL_COMMUNITY): Payer: Self-pay

## 2019-09-21 DIAGNOSIS — Z91040 Latex allergy status: Secondary | ICD-10-CM | POA: Insufficient documentation

## 2019-09-21 DIAGNOSIS — J45909 Unspecified asthma, uncomplicated: Secondary | ICD-10-CM | POA: Insufficient documentation

## 2019-09-21 DIAGNOSIS — Z79899 Other long term (current) drug therapy: Secondary | ICD-10-CM | POA: Insufficient documentation

## 2019-09-21 DIAGNOSIS — G43909 Migraine, unspecified, not intractable, without status migrainosus: Secondary | ICD-10-CM | POA: Insufficient documentation

## 2019-09-21 LAB — BASIC METABOLIC PANEL
Anion gap: 3 — ABNORMAL LOW (ref 5–15)
BUN: 7 mg/dL (ref 6–20)
CO2: 23 mmol/L (ref 22–32)
Calcium: 8.4 mg/dL — ABNORMAL LOW (ref 8.9–10.3)
Chloride: 110 mmol/L (ref 98–111)
Creatinine, Ser: 0.55 mg/dL (ref 0.44–1.00)
GFR calc Af Amer: >60 mL/min (ref >60)
GFR calc non Af Amer: >60 mL/min (ref >60)
Glucose, Bld: 108 mg/dL — ABNORMAL HIGH (ref 70–99)
Potassium: 3.8 mmol/L (ref 3.5–5.1)
Sodium: 136 mmol/L (ref 135–145)

## 2019-09-21 LAB — CBC WITH DIFFERENTIAL/PLATELET
Abs Immature Granulocytes: 0.01 10*3/uL (ref 0.00–0.07)
Basophils Absolute: 0 10*3/uL (ref 0.0–0.1)
Basophils Relative: 0 %
Eosinophils Absolute: 0 10*3/uL (ref 0.0–0.5)
Eosinophils Relative: 0 %
HCT: 36.9 % (ref 36.0–46.0)
Hemoglobin: 12 g/dL (ref 12.0–15.0)
Immature Granulocytes: 0 %
Lymphocytes Relative: 18 %
Lymphs Abs: 0.8 10*3/uL (ref 0.7–4.0)
MCH: 28 pg (ref 26.0–34.0)
MCHC: 32.5 g/dL (ref 30.0–36.0)
MCV: 86 fL (ref 80.0–100.0)
Monocytes Absolute: 0.1 10*3/uL (ref 0.1–1.0)
Monocytes Relative: 2 %
Neutro Abs: 3.7 10*3/uL (ref 1.7–7.7)
Neutrophils Relative %: 80 %
Platelets: 252 10*3/uL (ref 150–400)
RBC: 4.29 MIL/uL (ref 3.87–5.11)
RDW: 13.2 % (ref 11.5–15.5)
WBC: 4.6 10*3/uL (ref 4.0–10.5)
nRBC: 0 % (ref 0.0–0.2)

## 2019-09-21 MED ORDER — SUMATRIPTAN SUCCINATE 6 MG/0.5ML ~~LOC~~ SOLN
6.0000 mg | Freq: Once | SUBCUTANEOUS | Status: AC
  Administered 2019-09-21: 6 mg via SUBCUTANEOUS
  Filled 2019-09-21: qty 0.5

## 2019-09-21 MED ORDER — IBUPROFEN 800 MG PO TABS
800.0000 mg | ORAL_TABLET | Freq: Once | ORAL | Status: AC
  Administered 2019-09-21: 800 mg via ORAL
  Filled 2019-09-21: qty 1

## 2019-09-21 MED ORDER — METHYLPREDNISOLONE SODIUM SUCC 125 MG IJ SOLR
125.0000 mg | Freq: Once | INTRAMUSCULAR | Status: AC
  Administered 2019-09-21: 19:00:00 125 mg via INTRAVENOUS
  Filled 2019-09-21: qty 2

## 2019-09-21 MED ORDER — PROCHLORPERAZINE MALEATE 10 MG PO TABS
10.0000 mg | ORAL_TABLET | Freq: Once | ORAL | Status: AC
  Administered 2019-09-21: 17:00:00 10 mg via ORAL
  Filled 2019-09-21: qty 1

## 2019-09-21 MED ORDER — SODIUM CHLORIDE 0.9 % IV BOLUS (SEPSIS)
1000.0000 mL | Freq: Once | INTRAVENOUS | Status: AC
  Administered 2019-09-21: 21:00:00 1000 mL via INTRAVENOUS

## 2019-09-21 MED ORDER — KETOROLAC TROMETHAMINE 15 MG/ML IJ SOLN
15.0000 mg | Freq: Once | INTRAMUSCULAR | Status: DC
  Filled 2019-09-21: qty 1

## 2019-09-21 MED ORDER — SODIUM CHLORIDE 0.9 % IV BOLUS (SEPSIS)
1000.0000 mL | Freq: Once | INTRAVENOUS | Status: AC
  Administered 2019-09-21: 1000 mL via INTRAVENOUS

## 2019-09-21 NOTE — ED Provider Notes (Signed)
Care handoff received from [facility], PA-C at shift change.  Please see her note for further details.  In short 33 year old female presents today for migraine that began yesterday.  Reports that is consistent with previous migraines however more severe.  No neuro deficits or meningeal signs on exam.  Patient refused initial migraine cocktail and was only given Compazine and fluids.  Plan was initially to discharge however pain worsened.  Patient has received Solu-Medrol and ibuprofen as well.  Patient will be receiving Imitrex soon.  Plan of care at shift change is to follow-up on CBC, BMP and CT head.  Pending improvement of symptoms and reassuring results anticipate discharge. Physical Exam  BP 121/86 (BP Location: Right Arm)   Pulse 71   Temp 99.5 F (37.5 C) (Oral)   Resp 18   Ht 5\' 8"  (1.727 m)   Wt 70.3 kg   SpO2 100%   BMI 23.57 kg/m   Physical Exam Constitutional:      General: She is not in acute distress.    Appearance: Normal appearance. She is well-developed. She is not ill-appearing or diaphoretic.  HENT:     Head: Normocephalic and atraumatic.     Right Ear: External ear normal.     Left Ear: External ear normal.     Nose: Nose normal.  Eyes:     General: Vision grossly intact. Gaze aligned appropriately.     Pupils: Pupils are equal, round, and reactive to light.  Neck:     Musculoskeletal: Normal range of motion.     Trachea: Trachea and phonation normal. No tracheal deviation.  Pulmonary:     Effort: Pulmonary effort is normal. No respiratory distress.  Musculoskeletal: Normal range of motion.  Skin:    General: Skin is warm and dry.  Neurological:     Mental Status: She is alert.     GCS: GCS eye subscore is 4. GCS verbal subscore is 5. GCS motor subscore is 6.     Comments: Speech is clear and goal oriented, follows commands Major Cranial nerves without deficit, no facial droop Normal strength in upper and lower extremities bilaterally including  dorsiflexion and plantar flexion, strong and equal grip strength Sensation normal to light and sharp touch Moves extremities without ataxia, coordination intact Normal gait  Psychiatric:        Behavior: Behavior normal.    ED Course/Procedures   Clinical Course as of Sep 20 2201  Wed Sep 21, 2019  1805 Patient here with migraine symptoms typical of her migraines in the past.  Was given Compazine and fluids.  She declined Toradol and Benadryl.  After Compazine she is feeling better and has now turned on the lights and taking the blanket off of her eyes and is looking at her phone.  Reports that she is feeling better.  I am going to give her p.o. trial as well as a dose of IV Solu-Medrol with hopes of this preventing recurrence of migraine.   [KM]  1935 Patient initially stated she was improved but now states she is worse upon going to discharge her.  She still declines any Toradol, Benadryl or any other medication.  She wants to try oral Motrin, we will give this to her. She is a very poor historian and it is difficult to get a further history or description of her current symptoms   [KM]  2037   Patient is now tearful. She states she still has a headache radiating into her face and  her neck.  She does report that she was given a "cocktail" in the past for previous migraines at the hospital but is not sure what was in it. I am going to give her some more fluids and try imitrex. At this point I will also scan her head and obtainlabs.  She denies suicidal or homicidal ideation.  Both the nurse and I attempted to have the patient explain her symptoms a little bit better but patient just answers "I do not know" to all of her questions.  Patient does not seem confused, she just seems reluctant to answer any of the questions.  Vitals remain normal, negative neuro exam. Patient handed to [facility] due to change of shift. I would consider a spinal tap if workup is negative and she still is not improved  after additional meds. Otherwise if she is improved and negative Ct/labs, she can return home   [KM]    Clinical Course User Index [KM] Alveria Apley, PA-C    Procedures  MDM  Patient reevaluated using her phone and drinking soda in no acute distress.  Reports some improvement of her symptoms today and would like to go home.  On examination she has no neurologic deficit or meningeal signs.  She has no history of fevers reports that her symptoms today are consistent with her normal migraines which she follows up with her primary care provider for. The patient denies any neurologic symptoms such as visual changes, focal numbness/weakness, balance problems, confusion, or speech difficulty to suggest a life-threatening intracranial process.  With reassuring work-up today and examination and history consistent with prior; do not feel lumbar puncture is indicated at this time.  Additionally patient is requesting discharge to home at this time. Tolerating PO without difficulty.  Do not suspect SAH, meningitis, venous sinus thrombosis, arteritis, CVA, dissection or other acute pathologies at this time.  Suspect patient with migraine headache.  Reviewed return precautions including development of fever, nausea/vomiting or neurologic symptoms, vision changes, confusion, lethargy, difficulty speaking/walking, or other new/worsening/concerning symptoms. Patient states understanding of return precautions. Patient is agreeable to discharge.  At this time there does not appear to be any evidence of an acute emergency medical condition and the patient appears stable for discharge with appropriate outpatient follow up. Diagnosis was discussed with patient who verbalizes understanding of care plan and is agreeable to discharge. I have discussed return precautions with patient who verbalizes understanding of return precautions. Patient encouraged to follow-up with their PCP or neurology. All questions  answered.  Patient seen and evaluated by Dr. Roslynn Amble during this visit, agrees with plan to discharge at this time with PCP or neurology outpatient follow-up.  Note: Portions of this report may have been transcribed using voice recognition software. Every effort was made to ensure accuracy; however, inadvertent computerized transcription errors may still be present.   Gari Crown 09/21/19 2224    Lucrezia Starch, MD 09/22/19 [phone]

## 2019-09-21 NOTE — ED Triage Notes (Signed)
Per [facility] pt coming in for migraine since yesterday with nausea and light sensitivity. Pt given Zofran 4mg  in route. Took Tylenol last night but none today.

## 2019-09-21 NOTE — ED Notes (Signed)
Pt c/o of Menstral cramping. Pt given heat pad.

## 2019-09-21 NOTE — ED Provider Notes (Signed)
[facility]   CSN: [id number] Arrival date & time: 09/21/19  1550     History   Chief Complaint Chief Complaint  Patient presents with  . Migraine    HPI Lori Mays is a 33 y.o. female.     Patient is a 33 year old female with past medical history of asthma, DVT, migraine headaches who presents emergency department for migraine headache.  Patient reports that she has not had a flareup in a long time.  Reports that yesterday she began to feel pain on the right side of her head which then change to left side of her head and her entire head.  Reports that it went away slightly after falling asleep at 2 AM but then returned this afternoon.  She has tried Tylenol with minimal relief.  Reports nausea without vomiting.  She does see some spots in her vision.  She is sensitivity to light, sound and smells.  This is typical of her migraine headaches in the past.  She denies any numbness, tingling, slurred speech, weakness, syncope, seizure.     Past Medical History:  Diagnosis Date  . Asthma   . Cyst 06/07/2012  . DVT, lower extremity, proximal ([facility])   . Endometriosis     Patient Active Problem List   Diagnosis Date Noted  . Asthma, chronic 06/22/2013  . Migraine, unspecified 06/22/2013  . ASCUS with positive high risk HPV 06/16/2013  . Arthralgia 06/13/2013  . Endometriosis 06/13/2013  . Edema of both legs 06/13/2013  . Weight gain 06/13/2013  . Cyst 06/07/2012    Past Surgical History:  Procedure Laterality Date  . laproscopy  2 years ago     OB History    Gravida  0   Para  0   Term  0   Preterm  0   AB  0   Living  0     SAB  0   TAB  0   Ectopic  0   Multiple  0   Live Births               Home Medications    Prior to Admission medications   Medication Sig Start Date End Date Taking? Authorizing Provider  Ibuprofen (ADVIL PO) Take by mouth.    [provider]  metroNIDAZOLE  (FLAGYL) 500 MG tablet Take 1 tablet (500 mg total) by mouth 2 (two) times daily. 09/09/19   Anyanwu, Jethro Bastos, MD  naproxen (NAPROSYN) 500 MG tablet Take 1 tablet (500 mg total) by mouth 2 (two) times daily with a meal. As needed for pain 09/09/19   Anyanwu, Jethro Bastos, MD  polyethylene glycol powder (GLYCOLAX/MIRALAX) 17 GM/SCOOP powder Take 17 g by mouth 2 (two) times daily as needed. 08/09/19   Claiborne Rigg, NP  traMADol (ULTRAM) 50 MG tablet Take 1-2 tablets (50-100 mg total) by mouth every 6 (six) hours as needed for severe pain. 09/09/19   Anyanwu, Jethro Bastos, MD  VENTOLIN HFA 108 (90 Base) MCG/ACT inhaler INHALE 2 PUFFS INTO THE LUNGS EVERY 6 (SIX) HOURS AS NEEDED FOR WHEEZING OR SHORTNESS OF BREATH. 03/18/19   Claiborne Rigg, NP  Vitamin D, Ergocalciferol, (DRISDOL) 1.25 MG (50000 UT) CAPS capsule Take 1 capsule (50,000 Units total) by mouth every 7 (seven) days. Patient not taking: Reported on 08/09/2019 05/23/19   Claiborne Rigg, NP    Family History Family History  Problem Relation Age of Onset  . Cancer Maternal Grandmother   .  Cancer Paternal Grandmother     Social History Social History   Tobacco Use  . Smoking status: Never Smoker  . Smokeless tobacco: Never Used  Substance Use Topics  . Alcohol use: Yes    Alcohol/week: 1.0 - 2.0 standard drinks    Types: 1 - 2 Glasses of wine per week  . Drug use: No     Allergies   Diphenhydramine hcl and Latex   Review of Systems Review of Systems  Constitutional: Negative for appetite change, chills and fever.  Eyes: Positive for photophobia and visual disturbance. Negative for pain, discharge, redness and itching.  Respiratory: Negative for cough and shortness of breath.   Gastrointestinal: Positive for nausea. Negative for abdominal pain and vomiting.  Genitourinary: Negative for dysuria.  Musculoskeletal: Positive for neck pain. Negative for back pain and neck stiffness.  Neurological: Positive for headaches. Negative  for dizziness, tremors, seizures, syncope, facial asymmetry, speech difficulty, weakness, light-headedness and numbness.  All other systems reviewed and are negative.    Physical Exam Updated Vital Signs BP 121/86 (BP Location: Right Arm)   Pulse 71   Temp 99.5 F (37.5 C) (Oral)   Resp 18   SpO2 100%   Physical Exam Vitals signs and nursing note reviewed.  Constitutional:      General: She is not in acute distress.    Appearance: Normal appearance. She is not ill-appearing, toxic-appearing or diaphoretic.  HENT:     Head: Normocephalic.     Mouth/Throat:     Mouth: Mucous membranes are moist.  Eyes:     Conjunctiva/sclera: Conjunctivae normal.     Pupils: Pupils are equal, round, and reactive to light.     Comments: Photophobia present  Cardiovascular:     Rate and Rhythm: Normal rate and regular rhythm.  Pulmonary:     Effort: Pulmonary effort is normal.     Breath sounds: Normal breath sounds.  Skin:    General: Skin is dry.  Neurological:     General: No focal deficit present.     Mental Status: She is alert.     Cranial Nerves: No cranial nerve deficit.     Sensory: No sensory deficit.  Psychiatric:        Mood and Affect: Mood normal.      ED Treatments / Results  Labs (all labs ordered are listed, but only abnormal results are displayed) Labs Reviewed - No data to display  EKG None  Radiology No results found.  Procedures Procedures (including critical care time)  Medications Ordered in ED Medications  sodium chloride 0.9 % bolus 1,000 mL (has no administration in time range)  ketorolac (TORADOL) 15 MG/ML injection 15 mg (has no administration in time range)  prochlorperazine (COMPAZINE) tablet 10 mg (has no administration in time range)     Initial Impression / Assessment and Plan / ED Course  I have reviewed the triage vital signs and the nursing notes.  Pertinent labs & imaging results that were available during my care of the patient  were reviewed by me and considered in my medical decision making (see chart for details).  Clinical Course as of Sep 20 2058  Wed Sep 21, 2019  1805 Patient here with migraine symptoms typical of her migraines in the past.  Was given Compazine and fluids.  She declined Toradol and Benadryl.  After Compazine she is feeling better and has now turned on the lights and taking the blanket off of her eyes and is looking at  her phone.  Reports that she is feeling better.  I am going to give her p.o. trial as well as a dose of IV Solu-Medrol with hopes of this preventing recurrence of migraine.   [KM]  1935 Patient initially stated she was improved but now states she is worse upon going to discharge her.  She still declines any Toradol, Benadryl or any other medication.  She wants to try oral Motrin, we will give this to her. She is a very poor historian and it is difficult to get a further history or description of her current symptoms   [KM]  2037   Patient is now tearful. She states she still has a headache radiating into her face and her neck.  She does report that she was given a "cocktail" in the past for previous migraines at the hospital but is not sure what was in it. I am going to give her some more fluids and try imitrex. At this point I will also scan her head and obtainlabs.  She denies suicidal or homicidal ideation.  Both the nurse and I attempted to have the patient explain her symptoms a little bit better but patient just answers "I do not know" to all of her questions.  Patient does not seem confused, she just seems reluctant to answer any of the questions.  Vitals remain normal, negative neuro exam. Patient handed to [facility] due to change of shift. I would consider a spinal tap if workup is negative and she still is not improved after additional meds. Otherwise if she is improved and negative Ct/labs, she can return home   [KM]    Clinical Course User Index [KM] Arlyn Dunning, PA-C          Final Clinical Impressions(s) / ED Diagnoses   Final diagnoses:  None    ED Discharge Orders    None       Jeral Pinch 09/21/19 2100    Jacalyn Lefevre, MD 09/21/19 2122

## 2019-09-21 NOTE — Discharge Instructions (Addendum)
You have been diagnosed today with Headache.  At this time there does not appear to be the presence of an emergent medical condition, however there is always the potential for conditions to change. Please read and follow the below instructions.  Please return to the Emergency Department immediately for any new or worsening symptoms. Please be sure to follow up with your Primary Care Provider within one week regarding your visit today; please call their office to schedule an appointment even if you are feeling better for a follow-up visit. Please drink plenty water and get plenty of rest to hear your symptoms.  You may take Tylenol ibuprofen as directed on packaging for your symptoms You may also follow-up with the specialist at [facility] neurology for further evaluation of migraines.  Get help right away if: Your headache gets very bad quickly. Your headache gets worse after a lot of physical activity. You keep throwing up. You have a stiff neck. You have trouble seeing. You have trouble speaking. You have pain in the eye or ear. Your muscles are weak or you lose muscle control. You lose your balance or have trouble walking. You feel like you will pass out (faint) or you pass out. You are mixed up (confused). You have a seizure. Any new/concerning or worsening symptoms  Please read the additional information packets attached to your discharge summary.  Note: Portions of this text may have been transcribed using voice recognition software. Every effort was made to ensure accuracy; however, inadvertent computerized transcription errors may still be present.

## 2019-09-21 NOTE — ED Notes (Signed)
Pt states that she does not like taking Toradol, but could not provide any symptoms as to why she does not want to take this medication. Provider has been made aware.

## 2019-09-21 NOTE — ED Notes (Signed)
Pt given a sandwich and coke. 

## 2019-10-24 ENCOUNTER — Other Ambulatory Visit: Payer: Self-pay

## 2019-10-24 ENCOUNTER — Other Ambulatory Visit: Payer: Self-pay | Admitting: *Deleted

## 2019-10-24 DIAGNOSIS — Z124 Encounter for screening for malignant neoplasm of cervix: Secondary | ICD-10-CM

## 2019-10-24 NOTE — Progress Notes (Signed)
Patient: Lori Mays           Date of Birth: July 07, 1986           MRN: [id number] Visit Date: 10/24/2019 PCP: Gildardo Pounds, NP  Temp: 98.2 oral  Cervical Cancer Screening Do you smoke?: No Have you ever had or been told you have an allergy to latex products?: Yes Marital status: Single Date of last pap smear: 2-5 yrs ago Date of last menstrual period: 10/18/19 Number of pregnancies: 0 Number of births: 0 Have you ever had any of the following? Hysterectomy: No Tubal ligation (tubes tied): No Abnormal bleeding: Yes Abnormal pap smear: Yes Venereal warts: No A sex partner with venereal warts: No A high risk* sex partner: No  Cervical Exam Normal Exam. Scant amount of old blood observed at cervical os consistent with the end of patients menstrual period. Patient has a history of an abnormal Pap smear in 2018 that no follow-up was completed. If today's Pap smear is normal next Pap smear is due in one year.  Patient's History Patient Active Problem List   Diagnosis Date Noted  . Pap smear for cervical cancer screening 10/24/2019  . Asthma, chronic 06/22/2013  . Migraine, unspecified 06/22/2013  . ASCUS with positive high risk HPV 06/16/2013  . Arthralgia 06/13/2013  . Endometriosis 06/13/2013  . Edema of both legs 06/13/2013  . Weight gain 06/13/2013  . Cyst 06/07/2012   Past Medical History:  Diagnosis Date  . Asthma   . Cyst 06/07/2012  . DVT, lower extremity, proximal ([place])   . Endometriosis     Family History  Problem Relation Age of Onset  . Cancer Maternal Grandmother   . Cancer Paternal Grandmother     Social History   Occupational History  . Not on file  Tobacco Use  . Smoking status: Never Smoker  . Smokeless tobacco: Never Used  Substance and Sexual Activity  . Alcohol use: Yes    Alcohol/week: 1.0 - 2.0 standard drinks    Types: 1 - 2 Glasses of wine per week  . Drug use: No  . Sexual activity: Not Currently    Partners: Male

## 2019-10-26 LAB — CYTOLOGY - PAP

## 2019-11-02 ENCOUNTER — Encounter (HOSPITAL_COMMUNITY): Payer: Self-pay

## 2019-11-09 ENCOUNTER — Other Ambulatory Visit: Payer: Self-pay

## 2019-11-09 ENCOUNTER — Encounter (HOSPITAL_COMMUNITY): Payer: Self-pay

## 2019-11-09 ENCOUNTER — Ambulatory Visit (HOSPITAL_COMMUNITY)
Admission: RE | Admit: 2019-11-09 | Discharge: 2019-11-09 | Disposition: A | Discharge status: Home / Self Care | Payer: Self-pay | Source: Ambulatory Visit | Attending: Obstetrics and Gynecology | Admitting: Obstetrics and Gynecology

## 2019-11-09 DIAGNOSIS — Z1239 Encounter for other screening for malignant neoplasm of breast: Secondary | ICD-10-CM

## 2019-11-09 DIAGNOSIS — R87612 Low grade squamous intraepithelial lesion on cytologic smear of cervix (LGSIL): Secondary | ICD-10-CM | POA: Insufficient documentation

## 2019-11-09 NOTE — Patient Instructions (Signed)
Explained breast self awareness with Thornell Mule. Patient did not need a Pap smear today due to last Pap smear was 10/24/2019. Explained the colposcopy the recommended follow-up for her abnormal Pap smear. Referred patient to the [facility] for a colposcopy to follow-up for her abnormal Pap smear. Appointment scheduled for Thursday, December 01, 2019 at 1435. Patient aware of appointment and will be there. Informed patient that a screening mammogram is recommended at age 31 unless clinically indicated prior. Thornell Mule verbalized understanding.  Estefano Victory, Arvil Chaco, RN 12:45 PM

## 2019-11-09 NOTE — Progress Notes (Signed)
Patient referred to [facility] due to having an abnormal Pap smear at the free cervical cancer screening on 10/24/2019.  Pap Smear: Pap smear not completed today. Last Pap smear was 10/24/2019 at the free cervical cancer screening and [facility]. Referred patient to the [facility] for a colposcopy to follow-up for her abnormal Pap smear. Appointment scheduled for Thursday, December 01, 2019 at 1435. Per patient has a history of an abnormal Pap smear in 2018 that a colposcopy was completed for follow-up. Last Pap smear result is in Epic.  Physical exam: Breasts Breasts symmetrical. No skin abnormalities bilateral breasts. No nipple retraction bilateral breasts. No nipple discharge bilateral breasts. No lymphadenopathy. No lumps palpated bilateral breasts. No complaints of pain or tenderness on exam. Screening mammogram recommended at age 50 unless clinically indicated prior.  Pelvic/Bimanual No Pap smear completed today since last Pap smear was 10/24/2019. Pap smear not indicated per [facility] guidelines.   Smoking History: Patient has never smoked.  Patient Navigation: Patient education provided. Access to services provided for patient through [facility] program.   Breast and Cervical Cancer Risk Assessment: Patient has a family history of her maternal grandmother, paternal grandmother, and a paternal aunt having breast cancer. Patient has no known genetic mutations or history of radiation treatment to the chest before age 41. Per patient has a history of cervical dysplasia. Patient has no history of being immunocompromised or DES exposure in-utero. Breast cancer risk assessment completed. No breast cancer risk calculated due to patient is less than 17 years old.

## 2019-12-01 ENCOUNTER — Encounter: Payer: Self-pay | Admitting: Obstetrics and Gynecology

## 2019-12-01 ENCOUNTER — Other Ambulatory Visit (HOSPITAL_COMMUNITY)
Admission: RE | Admit: 2019-12-01 | Discharge: 2019-12-01 | Disposition: A | Discharge status: Home / Self Care | Payer: Medicaid Other | Source: Ambulatory Visit | Attending: Obstetrics and Gynecology | Admitting: Obstetrics and Gynecology

## 2019-12-01 ENCOUNTER — Other Ambulatory Visit: Payer: Self-pay

## 2019-12-01 ENCOUNTER — Ambulatory Visit (INDEPENDENT_AMBULATORY_CARE_PROVIDER_SITE_OTHER): Payer: Medicaid Other | Admitting: Obstetrics and Gynecology

## 2019-12-01 VITALS — BP 108/77 | HR 72 | Ht 68.0 in | Wt 156.0 lb

## 2019-12-01 DIAGNOSIS — R87612 Low grade squamous intraepithelial lesion on cytologic smear of cervix (LGSIL): Secondary | ICD-10-CM

## 2019-12-01 DIAGNOSIS — N871 Moderate cervical dysplasia: Secondary | ICD-10-CM

## 2019-12-01 LAB — POCT PREGNANCY, URINE: Preg Test, Ur: NEGATIVE

## 2019-12-01 NOTE — Progress Notes (Signed)
    GYNECOLOGY CLINIC COLPOSCOPY PROCEDURE NOTE  33 y.o. G0P0000 here for colposcopy for low-grade squamous intraepithelial neoplasia (LGSIL - encompassing HPV,mild dysplasia,CIN I) pap smear on 09/2019. Discussed role for HPV in cervical dysplasia, need for surveillance.  Patient given informed consent, signed copy in the chart, time out was performed.  Placed in lithotomy position. Cervix viewed with speculum and colposcope after application of acetic acid.   Colposcopy adequate? Yes  no visible lesions;  biopsies obtained at 12 & 6 o'clock position.  ECC specimen obtained. Monsel's applied All specimens were labelled and sent to pathology.   Patient was given post procedure instructions.  Will follow up pathology and manage accordingly.  Routine preventative health maintenance measures emphasized.    Arlina Robes, MD, [place] Attending [place] [facility]

## 2019-12-01 NOTE — Patient Instructions (Signed)
Colposcopy, Care After This sheet gives you information about how to care for yourself after your procedure. Your doctor may also give you more specific instructions. If you have problems or questions, contact your doctor. What can I expect after the procedure? If you did not have a tissue sample removed (did not have a biopsy), you may only have some spotting for a few days. You can go back to your normal activities. If you had a tissue sample removed, it is common to have:  Soreness and pain. This may last for a few days.  Light-headedness.  Mild bleeding from your vagina or dark-colored, grainy discharge from your vagina. This may last for a few days. You may need to wear a sanitary pad.  Spotting for at least 48 hours after the procedure. Follow these instructions at home:   Take over-the-counter and prescription medicines only as told by your doctor. Ask your doctor what medicines you can start taking again. This is very important if you take blood-thinning medicine.  Do not drive or use heavy machinery while taking prescription pain medicine.  For 3 days, or as long as your doctor tells you, avoid: ? Douching. ? Using tampons. ? Having sex.  If you use birth control (contraception), keep using it.  Limit activity for the first day after the procedure. Ask your doctor what activities are safe for you.  It is up to you to get the results of your procedure. Ask your doctor when your results will be ready.  Keep all follow-up visits as told by your doctor. This is important. Contact a doctor if:  You get a skin rash. Get help right away if:  You are bleeding a lot from your vagina. It is a lot of bleeding if you are using more than one pad an hour for 2 hours in a row.  You have clumps of blood (blood clots) coming from your vagina.  You have a fever.  You have chills  You have pain in your lower belly (pelvic area).  You have signs of infection, such as vaginal  discharge that is: ? Different than usual. ? Yellow. ? Bad-smelling.  You have very pain or cramps in your lower belly that do not get better with medicine.  You feel light-headed.  You feel dizzy.  You pass out (faint). Summary  If you did not have a tissue sample removed (did not have a biopsy), you may only have some spotting for a few days. You can go back to your normal activities.  If you had a tissue sample removed, it is common to have mild pain and spotting for 48 hours.  For 3 days, or as long as your doctor tells you, avoid douching, using tampons and having sex.  Get help right away if you have bleeding, very bad pain, or signs of infection. This information is not intended to replace advice given to you by your health care provider. Make sure you discuss any questions you have with your health care provider. Document Released: 06/02/2008 Document Revised: 11/27/2017 Document Reviewed: 09/03/2016 Elsevier Patient Education  2020 Elsevier Inc.  

## 2019-12-05 ENCOUNTER — Other Ambulatory Visit: Payer: Self-pay

## 2019-12-06 ENCOUNTER — Telehealth: Payer: Self-pay | Admitting: Lactation Services

## 2019-12-06 DIAGNOSIS — R87612 Low grade squamous intraepithelial lesion on cytologic smear of cervix (LGSIL): Secondary | ICD-10-CM

## 2019-12-06 LAB — SURGICAL PATHOLOGY

## 2019-12-06 NOTE — Telephone Encounter (Signed)
-----   Message from Chancy Milroy, MD sent at 12/06/2019  9:53 AM EST ----- Pt needs LEEP

## 2019-12-06 NOTE — Telephone Encounter (Signed)
Called pt to inform her that she needs to have a LEEP procedure due to biopsy results. Discussed it is important for her to follow up. Informed pt that the front office will be calling her to schedule an appt. Pt is self pay, will ask front office to send [facility] Application to pt also. Pt voiced understanding.   Message to front office to schedule LEEP with Dr. Rip Harbour and to send [place].

## 2019-12-12 ENCOUNTER — Telehealth (HOSPITAL_COMMUNITY): Payer: Self-pay | Admitting: *Deleted

## 2019-12-12 NOTE — Telephone Encounter (Signed)
Attempted to call patient on both mobile and home phone numbers. Patient is eligible for [facility] Medicaid. Attempted to call patient to schedule a time for her to complete her [facility] Medicaid Application. No one answered her mobile phone and voicemail box is full. No one answered her home phone and it continued to ring with no voicemail picking up. Will attempt to call patient back at a later time.

## 2019-12-16 ENCOUNTER — Ambulatory Visit: Payer: Self-pay | Attending: Nurse Practitioner

## 2019-12-16 ENCOUNTER — Other Ambulatory Visit: Payer: Self-pay

## 2019-12-16 DIAGNOSIS — B9689 Other specified bacterial agents as the cause of diseases classified elsewhere: Secondary | ICD-10-CM

## 2019-12-16 NOTE — Progress Notes (Signed)
Pt. Called having vaginal discharges. CMA placed future orders for a self swap.

## 2019-12-19 ENCOUNTER — Other Ambulatory Visit: Payer: Self-pay | Admitting: Nurse Practitioner

## 2019-12-19 ENCOUNTER — Telehealth (HOSPITAL_COMMUNITY): Payer: Self-pay | Admitting: *Deleted

## 2019-12-19 LAB — CERVICOVAGINAL ANCILLARY ONLY
Bacterial Vaginitis (gardnerella): POSITIVE — AB
Candida Glabrata: NEGATIVE
Candida Vaginitis: NEGATIVE
Chlamydia: NEGATIVE
Comment: NEGATIVE
Comment: NEGATIVE
Comment: NEGATIVE
Comment: NEGATIVE
Comment: NEGATIVE
Comment: NORMAL
Neisseria Gonorrhea: NEGATIVE
Trichomonas: NEGATIVE

## 2019-12-19 MED ORDER — METRONIDAZOLE 500 MG PO TABS: 500.0000 mg | ORAL_TABLET | Freq: Two times a day (BID) | ORAL | 0 refills | Status: AC

## 2019-12-19 NOTE — Telephone Encounter (Signed)
Attempted to call patient to schedule a time for her to complete her [facility] Medicaid Application. No one answered her mobile phone and voicemail box is full.

## 2019-12-20 ENCOUNTER — Ambulatory Visit: Payer: Self-pay

## 2019-12-30 HISTORY — PX: LEEP: SHX91

## 2020-01-10 ENCOUNTER — Other Ambulatory Visit: Payer: Self-pay

## 2020-01-10 ENCOUNTER — Ambulatory Visit (INDEPENDENT_AMBULATORY_CARE_PROVIDER_SITE_OTHER): Payer: Medicaid Other | Admitting: Obstetrics and Gynecology

## 2020-01-10 ENCOUNTER — Other Ambulatory Visit (HOSPITAL_COMMUNITY)
Admission: RE | Admit: 2020-01-10 | Discharge: 2020-01-10 | Disposition: A | Discharge status: Home / Self Care | Payer: Medicaid Other | Source: Ambulatory Visit | Attending: Obstetrics and Gynecology | Admitting: Obstetrics and Gynecology

## 2020-01-10 ENCOUNTER — Encounter: Payer: Self-pay | Admitting: Obstetrics and Gynecology

## 2020-01-10 DIAGNOSIS — N871 Moderate cervical dysplasia: Secondary | ICD-10-CM | POA: Insufficient documentation

## 2020-01-10 LAB — POCT PREGNANCY, URINE: Preg Test, Ur: NEGATIVE

## 2020-01-10 NOTE — Progress Notes (Signed)
   GYNECOLOGY CLINIC PROCEDURE NOTE  Lori Mays is a 34 y.o. G0P0000 here for LEEP. No GYN concerns. Pap smear and colposcopy reviewed.    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  Role of HPV,cervical dysplasia and need for close followup was empasized. Informed written consent was obtained. All questions were answered. Time out performed.  Procedure: The patient was placed in lithotomy position and the bivalved coated speculum was placed in the patient's vagina. A grounding pad placed on the patient. Lugol's solution was applied to the cervix and areas of decreased uptake were noted around the transformation zone.   Local anesthesia was administered via an intracervical block using 10cc of 2% Lidocaine with epinephrine. The suction was turned on and the Small 1X Fisher Cone Biopsy Excisor on 58 Watts of cutting current was used to excise the area of decreased uptake and excise the entire transformation zone. Excellent hemostasis was achieved using roller ball coagulation set at 50 Watts coagulation current. Monsel's solution was then applied and the speculum was removed from the vagina. Specimens were sent to pathology.  The patient tolerated the procedure well. Post-operative instructions given to patient, including instruction to seek medical attention for persistent bright red bleeding, fever, abdominal/pelvic pain, dysuria, nausea or vomiting. She was also told about the possibility of having copious yellow to black tinged discharge for weeks. She was counseled to avoid anything in the vagina (sex/douching/tampons) for 3 weeks. She has a 4 week post-operative check to assess wound healing, review results and discuss  further management.   Nettie Elm, MD, FACOG Attending Obstetrician & Gynecologist [facility]

## 2020-01-10 NOTE — Patient Instructions (Signed)

## 2020-01-12 LAB — SURGICAL PATHOLOGY

## 2020-01-13 ENCOUNTER — Telehealth (HOSPITAL_COMMUNITY): Payer: Self-pay

## 2020-01-13 NOTE — Telephone Encounter (Signed)
Left message on voicemail requesting pt to return call regarding insurance assistance.

## 2020-01-16 ENCOUNTER — Telehealth (HOSPITAL_COMMUNITY): Payer: Self-pay

## 2020-01-16 NOTE — Telephone Encounter (Signed)
Left message on voicemail requesting patient to return call to [facility] program regarding insurance.

## 2020-01-24 ENCOUNTER — Encounter: Payer: Self-pay | Admitting: General Practice

## 2020-01-25 ENCOUNTER — Telehealth (HOSPITAL_COMMUNITY): Payer: Self-pay

## 2020-01-25 NOTE — Telephone Encounter (Signed)
Left message with patient's mother requesting patient to return call to [facility] program. Mom stated she would give patient the message to return call.

## 2020-02-01 MED FILL — POLYETHYLENE GLYCOL 3350 PO: 17 | 7 days supply | Qty: 238 | Fill #1

## 2020-02-01 MED FILL — VIT D2 1.25 MG (50,000 UNIT: 1.25 MG | 84 days supply | Qty: 12 | Fill #1

## 2020-02-02 ENCOUNTER — Other Ambulatory Visit: Payer: Self-pay

## 2020-02-02 ENCOUNTER — Ambulatory Visit: Payer: Self-pay | Attending: Nurse Practitioner | Admitting: Nurse Practitioner

## 2020-02-03 ENCOUNTER — Other Ambulatory Visit: Payer: Self-pay

## 2020-02-03 ENCOUNTER — Ambulatory Visit (INDEPENDENT_AMBULATORY_CARE_PROVIDER_SITE_OTHER): Payer: Medicaid Other | Admitting: Obstetrics and Gynecology

## 2020-02-03 ENCOUNTER — Telehealth (HOSPITAL_COMMUNITY): Payer: Self-pay

## 2020-02-03 ENCOUNTER — Encounter: Payer: Self-pay | Admitting: Obstetrics and Gynecology

## 2020-02-03 DIAGNOSIS — N871 Moderate cervical dysplasia: Secondary | ICD-10-CM

## 2020-02-03 NOTE — Patient Instructions (Signed)
Health Maintenance, Female Adopting a healthy lifestyle and getting preventive care are important in promoting health and wellness. Ask your health care provider about:  The right schedule for you to have regular tests and exams.  Things you can do on your own to prevent diseases and keep yourself healthy. What should I know about diet, weight, and exercise? Eat a healthy diet   Eat a diet that includes plenty of vegetables, fruits, low-fat dairy products, and lean protein.  Do not eat a lot of foods that are high in solid fats, added sugars, or sodium. Maintain a healthy weight Body mass index (BMI) is used to identify weight problems. It estimates body fat based on height and weight. Your health care provider can help determine your BMI and help you achieve or maintain a healthy weight. Get regular exercise Get regular exercise. This is one of the most important things you can do for your health. Most adults should:  Exercise for at least 150 minutes each week. The exercise should increase your heart rate and make you sweat (moderate-intensity exercise).  Do strengthening exercises at least twice a week. This is in addition to the moderate-intensity exercise.  Spend less time sitting. Even light physical activity can be beneficial. Watch cholesterol and blood lipids Have your blood tested for lipids and cholesterol at 34 years of age, then have this test every 5 years. Have your cholesterol levels checked more often if:  Your lipid or cholesterol levels are high.  You are older than 34 years of age.  You are at high risk for heart disease. What should I know about cancer screening? Depending on your health history and family history, you may need to have cancer screening at various ages. This may include screening for:  Breast cancer.  Cervical cancer.  Colorectal cancer.  Skin cancer.  Lung cancer. What should I know about heart disease, diabetes, and high blood  pressure? Blood pressure and heart disease  High blood pressure causes heart disease and increases the risk of stroke. This is more likely to develop in people who have high blood pressure readings, are of African descent, or are overweight.  Have your blood pressure checked: ? Every 3-5 years if you are 18-39 years of age. ? Every year if you are 40 years old or older. Diabetes Have regular diabetes screenings. This checks your fasting blood sugar level. Have the screening done:  Once every three years after age 40 if you are at a normal weight and have a low risk for diabetes.  More often and at a younger age if you are overweight or have a high risk for diabetes. What should I know about preventing infection? Hepatitis B If you have a higher risk for hepatitis B, you should be screened for this virus. Talk with your health care provider to find out if you are at risk for hepatitis B infection. Hepatitis C Testing is recommended for:  Everyone born from 1945 through 1965.  Anyone with known risk factors for hepatitis C. Sexually transmitted infections (STIs)  Get screened for STIs, including gonorrhea and chlamydia, if: ? You are sexually active and are younger than 34 years of age. ? You are older than 34 years of age and your health care provider tells you that you are at risk for this type of infection. ? Your sexual activity has changed since you were last screened, and you are at increased risk for chlamydia or gonorrhea. Ask your health care provider if   you are at risk.  Ask your health care provider about whether you are at high risk for HIV. Your health care provider may recommend a prescription medicine to help prevent HIV infection. If you choose to take medicine to prevent HIV, you should first get tested for HIV. You should then be tested every 3 months for as long as you are taking the medicine. Pregnancy  If you are about to stop having your period (premenopausal) and  you may become pregnant, seek counseling before you get pregnant.  Take 400 to 800 micrograms (mcg) of folic acid every day if you become pregnant.  Ask for birth control (contraception) if you want to prevent pregnancy. Osteoporosis and menopause Osteoporosis is a disease in which the bones lose minerals and strength with aging. This can result in bone fractures. If you are 65 years old or older, or if you are at risk for osteoporosis and fractures, ask your health care provider if you should:  Be screened for bone loss.  Take a calcium or vitamin D supplement to lower your risk of fractures.  Be given hormone replacement therapy (HRT) to treat symptoms of menopause. Follow these instructions at home: Lifestyle  Do not use any products that contain nicotine or tobacco, such as cigarettes, e-cigarettes, and chewing tobacco. If you need help quitting, ask your health care provider.  Do not use street drugs.  Do not share needles.  Ask your health care provider for help if you need support or information about quitting drugs. Alcohol use  Do not drink alcohol if: ? Your health care provider tells you not to drink. ? You are pregnant, may be pregnant, or are planning to become pregnant.  If you drink alcohol: ? Limit how much you use to 0-1 drink a day. ? Limit intake if you are breastfeeding.  Be aware of how much alcohol is in your drink. In the [place] one drink equals one 12 oz bottle of beer (355 mL), one 5 oz glass of wine (148 mL), or one 1 oz glass of hard liquor (44 mL). General instructions  Schedule regular health, dental, and eye exams.  Stay current with your vaccines.  Tell your health care provider if: ? You often feel depressed. ? You have ever been abused or do not feel safe at home. Summary  Adopting a healthy lifestyle and getting preventive care are important in promoting health and wellness.  Follow your health care provider's instructions about healthy  diet, exercising, and getting tested or screened for diseases.  Follow your health care provider's instructions on monitoring your cholesterol and blood pressure. This information is not intended to replace advice given to you by your health care provider. Make sure you discuss any questions you have with your health care provider. Document Revised: 12/08/2018 Document Reviewed: 12/08/2018 Elsevier Patient Education  2020 Elsevier Inc.  

## 2020-02-03 NOTE — Progress Notes (Signed)
Patient ID: Lori Mays, female   DOB: 30-Jan-1986, 34 y.o.   MRN: [id number] Lori Mays is here for follow up from LEEP. 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 Pathology reviewed with pt  PE AF VSS Lung clear Heart RRR Abd soft + BS GU LEEP site well healed  A/P S/P LEEP Will repeat pap with HPV co testing 1/22

## 2020-02-03 NOTE — Progress Notes (Signed)
Pain abdomen & hip, down left leg.Marland KitchenHas PCP, Meredeth Ide, has appt on 02/13/20.

## 2020-02-03 NOTE — Telephone Encounter (Signed)
Left message with grandmother requesting patient to return call to office to complete time-sensitive forms.   I tried to contact patient via mobile number [phone]), received message, "mailbox full."

## 2020-02-13 ENCOUNTER — Encounter: Payer: Self-pay | Admitting: Nurse Practitioner

## 2020-02-13 ENCOUNTER — Telehealth: Payer: Self-pay

## 2020-02-13 ENCOUNTER — Ambulatory Visit: Payer: Medicaid Other | Attending: Nurse Practitioner | Admitting: Nurse Practitioner

## 2020-02-13 ENCOUNTER — Other Ambulatory Visit: Payer: Self-pay

## 2020-02-13 DIAGNOSIS — N761 Subacute and chronic vaginitis: Secondary | ICD-10-CM

## 2020-02-13 DIAGNOSIS — K5909 Other constipation: Secondary | ICD-10-CM

## 2020-02-13 MED FILL — POLYETHYLENE GLYCOL 3350 PO: 17 | 7 days supply | Qty: 238 | Fill #1

## 2020-02-13 NOTE — Progress Notes (Signed)
Virtual Visit via Telephone Note Due to national recommendations of social distancing due to Blum 19, telehealth visit is felt to be most appropriate for this patient at this time.  I discussed the limitations, risks, security and privacy concerns of performing an evaluation and management service by telephone and the availability of in person appointments. I also discussed with the patient that there may be a patient responsible charge related to this service. The patient expressed understanding and agreed to proceed.    I connected with Lori Mays on 02/13/20  at   3:50 PM EST  EDT by telephone and verified that I am speaking with the correct person using two identifiers.   Consent I discussed the limitations, risks, security and privacy concerns of performing an evaluation and management service by telephone and the availability of in person appointments. I also discussed with the patient that there may be a patient responsible charge related to this service. The patient expressed understanding and agreed to proceed.   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   Chronic Constipation Ongoing for over a year. Believes she has IBS. I prescribed her miralax last year in August. She is picking it up today. Will try it for 2 weeks and will follow up. Having 1-2  bowel movement per week. She has tried stimulant teas with not much success.  Drinks about 2 liters of water per day.     Vaginitis She has recurring symptoms of BV. This will be her 4th diagnosis since last June if positive. Will need long term  therapy. Denies any sexual partners or being sexually active.  Patient complains of an abnormal malodorous vaginal discharge. STI Risk: Very low risk of STD exposure.  BV she is not sexually active. She is not on any special diets.     Past Medical History:  Diagnosis Date  . ASCUS with positive high risk HPV 06/16/2013  . Asthma   . Cyst 06/07/2012  . 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 Endometriosis     Past Surgical History:  Procedure Laterality Date  . laproscopy  2011    Family History  Problem Relation Age of Onset  . Breast cancer Maternal Grandmother   . Brain cancer Paternal Grandmother   . Breast cancer Paternal Grandmother   . Breast cancer Paternal Aunt     Social History   Socioeconomic History  . Marital status: Single    Spouse name: Not on file  . Number of children: Not on file  . Years of education: Not on file  . Highest education level: Some college, no degree  Occupational History  . Not on file  Tobacco Use  . Smoking status: Never Smoker  . Smokeless tobacco: Never Used  Substance and Sexual Activity  . Alcohol use: Yes    Alcohol/week: 1.0 - 2.0 standard drinks    Types: 1 - 2 Glasses of wine per week    Comment: occassionally  . Drug use: No  . Sexual activity: Yes    Partners: Male    Birth control/protection: Condom  Other Topics Concern  . Not on file  Social History Narrative  .  Not on file   Social Determinants of Health   Financial Resource Strain:   . Difficulty of Paying Living Expenses: Not on file  Food Insecurity:   . Worried About Programme researcher, broadcasting/film/video in the Last Year: Not on file  . Ran Out of Food in the Last Year: Not on file  Transportation Needs: No Transportation Needs  . Lack of Transportation (Medical): No  . Lack of Transportation (Non-Medical): No  Physical Activity:   . Days of Exercise per Week: Not on file  . Minutes of Exercise per Session: Not on file  Stress:   . Feeling of Stress : Not on file   Social Connections:   . Frequency of Communication with Friends and Family: Not on file  . Frequency of Social Gatherings with Friends and Family: Not on file  . Attends Religious Services: Not on file  . Active Member of Clubs or Organizations: Not on file  . Attends Banker Meetings: Not on file  . Marital Status: Not on file     Observations/Objective: Awake, alert and oriented x 3   Review of Systems  Constitutional: Negative for fever, malaise/fatigue and weight loss.  HENT: Negative.  Negative for nosebleeds.   Eyes: Negative.  Negative for blurred vision, double vision and photophobia.  Respiratory: Negative.  Negative for cough and shortness of breath.   Cardiovascular: Negative.  Negative for chest pain, palpitations and leg swelling.  Gastrointestinal: Positive for constipation. Negative for abdominal pain, blood in stool, diarrhea, heartburn, melena, nausea and vomiting.  Musculoskeletal: Negative.  Negative for myalgias.  Neurological: Negative.  Negative for dizziness, focal weakness, seizures and headaches.  Psychiatric/Behavioral: Negative.  Negative for suicidal ideas.    Assessment and Plan: Naje was seen today for abdominal pain.  Diagnoses and all orders for this visit:  Chronic constipation Taking Miralax  Chronic vaginitis - Cervicovaginal ancillary only; Future     Follow Up Instructions Return in about 2 weeks (around 02/27/2020).     I discussed the assessment and treatment plan with the patient. The patient was provided an opportunity to ask questions and all were answered. The patient agreed with the plan and demonstrated an understanding of the instructions.   The patient was advised to call back or seek an in-person evaluation if the symptoms worsen or if the condition fails to improve as anticipated.  I provided 14 minutes of non-face-to-face time during this encounter including median intraservice time, reviewing previous  notes, labs, imaging, medications and explaining diagnosis and management.  Claiborne Rigg, FNP-BC

## 2020-02-13 NOTE — Telephone Encounter (Signed)
I attempted to contact patient regarding completion of Medicaid application. Mobile number [phone] full".  Home number [phone]- "call cannot be completed at this time." My chart message sent.

## 2020-02-15 ENCOUNTER — Other Ambulatory Visit: Payer: Self-pay

## 2020-02-15 ENCOUNTER — Ambulatory Visit: Payer: Self-pay | Attending: Nurse Practitioner

## 2020-02-15 ENCOUNTER — Ambulatory Visit: Payer: Self-pay

## 2020-02-15 DIAGNOSIS — N761 Subacute and chronic vaginitis: Secondary | ICD-10-CM

## 2020-02-16 LAB — CERVICOVAGINAL ANCILLARY ONLY
Bacterial Vaginitis (gardnerella): POSITIVE — AB
Candida Glabrata: NEGATIVE
Candida Vaginitis: NEGATIVE
Chlamydia: NEGATIVE
Comment: NEGATIVE
Comment: NEGATIVE
Comment: NEGATIVE
Comment: NEGATIVE
Comment: NEGATIVE
Comment: NORMAL
Neisseria Gonorrhea: NEGATIVE
Trichomonas: NEGATIVE

## 2020-02-17 ENCOUNTER — Other Ambulatory Visit: Payer: Self-pay | Admitting: Nurse Practitioner

## 2020-02-17 MED ORDER — METRONIDAZOLE 500 MG PO TABS: 500.0000 mg | ORAL_TABLET | Freq: Two times a day (BID) | ORAL | 0 refills | Status: AC

## 2020-02-17 MED ORDER — METRONIDAZOLE 0.75 % VA GEL: 1.0000 | VAGINAL | 3 refills | Status: DC

## 2020-02-17 MED FILL — metroNIDAZOLE 0.75 % GEL: 0.75 | 14 days supply | Qty: 70 | Fill #0

## 2020-02-17 MED FILL — metroNIDAZOLE 500 MG TABS: 500 | 10 days supply | Qty: 20 | Fill #0

## 2020-02-27 ENCOUNTER — Telehealth: Payer: Self-pay

## 2020-02-27 NOTE — Telephone Encounter (Signed)
Attempted to contact patient again regarding completion of the medicaid application, received recording"mailbox full".  Message sent via MyChart.

## 2020-03-12 DIAGNOSIS — N760 Acute vaginitis: Secondary | ICD-10-CM

## 2020-03-12 DIAGNOSIS — B9689 Other specified bacterial agents as the cause of diseases classified elsewhere: Secondary | ICD-10-CM

## 2020-03-14 MED ORDER — CLINDAMYCIN HCL 300 MG PO CAPS: 300.0000 mg | ORAL_CAPSULE | Freq: Two times a day (BID) | ORAL | 0 refills | Status: DC

## 2020-03-14 MED FILL — CLINDAMYCIN HCL 300 MG CAPS: 300 | 7 days supply | Qty: 14 | Fill #0

## 2020-03-28 ENCOUNTER — Encounter: Payer: Self-pay | Admitting: Family Medicine

## 2020-03-28 ENCOUNTER — Other Ambulatory Visit: Payer: Self-pay

## 2020-03-28 ENCOUNTER — Ambulatory Visit (INDEPENDENT_AMBULATORY_CARE_PROVIDER_SITE_OTHER): Payer: Medicaid Other | Admitting: Family Medicine

## 2020-03-28 ENCOUNTER — Other Ambulatory Visit (HOSPITAL_COMMUNITY)
Admission: RE | Admit: 2020-03-28 | Discharge: 2020-03-28 | Disposition: A | Discharge status: Home / Self Care | Payer: Medicaid Other | Source: Ambulatory Visit | Attending: Family Medicine | Admitting: Family Medicine

## 2020-03-28 VITALS — BP 123/83 | HR 65 | Wt 155.4 lb

## 2020-03-28 DIAGNOSIS — N809 Endometriosis, unspecified: Secondary | ICD-10-CM | POA: Diagnosis not present

## 2020-03-28 DIAGNOSIS — R102 Pelvic and perineal pain: Secondary | ICD-10-CM

## 2020-03-28 MED ORDER — DICLOFENAC SODIUM 75 MG PO TBEC: 75.0000 mg | DELAYED_RELEASE_TABLET | Freq: Two times a day (BID) | ORAL | 2 refills | Status: DC

## 2020-03-28 MED ORDER — DOXYCYCLINE HYCLATE 100 MG PO CAPS: 100.0000 mg | ORAL_CAPSULE | Freq: Two times a day (BID) | ORAL | 0 refills | Status: AC

## 2020-03-28 MED ORDER — NORETHINDRONE ACETATE 5 MG PO TABS: 5.0000 mg | ORAL_TABLET | Freq: Every day | ORAL | 2 refills | Status: DC

## 2020-03-28 NOTE — Assessment & Plan Note (Addendum)
Begin Aygestin--desires to preserve fertility. May need REI referral-discussed short and long term goals and need for further treatment of her disease. She is essentially non-functional due to pain.

## 2020-03-28 NOTE — Progress Notes (Signed)
Pain has been on and off but Last night has been the worst, had to sit on a heating pad, states it stayed on the right side of body legs and back. Urine output is low and very nauseous and not sleeping because of the pain.  attempted Advil and tiger balm to relieve the pain  Most recent LMP lasted a week and was darkish purple and red. States she was told prior that she has fluid on her right side.  Took advil over 1 hour ago still in pain  Pressure in the rectum states she may be constipated. Tried her grandmothers colace may need her own RX the mirelax isnt helping because she cant drink because of the nausea.  Would like Rx for nausea also.

## 2020-03-28 NOTE — Progress Notes (Signed)
    Subjective:    Patient ID: Lori Mays is a 34 y.o. female presenting with Pelvic Pain  on 03/28/2020  HPI: Has long h/o endometriosis and pain. Had leep in January 2021. Notes pain since then. Pain is mostly in the hip and groin. It radiates into her tube and to the outer vagina. Notes pain in lower abdomen and lower back. Had some shaking chills last pm. No fevers that she knows of. Improves with Ibuprofen. Notes pain daily and taking tylenol and Ibuprofen and heat.  Notes bleeding since last week and it is almost over.  Review of Systems  Constitutional: Negative for chills and fever.  Respiratory: Negative for shortness of breath.   Cardiovascular: Negative for chest pain.  Gastrointestinal: Negative for abdominal pain, nausea and vomiting.  Genitourinary: Negative for dysuria.  Skin: Negative for rash.      Objective:    BP 123/83   Pulse 65   Wt 155 lb 6.4 oz (70.5 kg)   BMI 23.63 kg/m  Physical Exam Exam conducted with a chaperone present.  Constitutional:      General: She is not in acute distress.    Appearance: She is well-developed.  HENT:     Head: Normocephalic and atraumatic.  Eyes:     General: No scleral icterus. Cardiovascular:     Rate and Rhythm: Normal rate.  Pulmonary:     Effort: Pulmonary effort is normal.  Abdominal:     Palpations: Abdomen is soft.  Genitourinary:    Cervix: Cervical motion tenderness present.     Uterus: Tender.      Adnexa:        Right: Tenderness present.        Left: Tenderness present.   Musculoskeletal:     Cervical back: Neck supple.  Skin:    General: Skin is warm and dry.  Neurological:     Mental Status: She is alert and oriented to person, place, and time.         Assessment & Plan:   Problem List Items Addressed This Visit      Unprioritized   Endometriosis    Begin Aygestin--desires to preserve fertility. May need REI referral-discussed short and long term goals and need for further  treatment of her disease. She is essentially non-functional due to pain.      Relevant Medications   norethindrone (AYGESTIN) 5 MG tablet    Other Visit Diagnoses    Pelvic pain    -  Primary   treat presumptively for PID--Check u/s   Relevant Medications   norethindrone (AYGESTIN) 5 MG tablet   doxycycline (VIBRAMYCIN) 100 MG capsule   diclofenac (VOLTAREN) 75 MG EC tablet   Other Relevant Orders   Cervicovaginal ancillary only( )   US PELVIC COMPLETE WITH TRANSVAGINAL      Total time: 30 minutes.  Return in about 4 weeks (around 04/25/2020) for a follow-up, in person, needs U/S.  Reva Bores 03/28/2020 5:00 PM

## 2020-03-29 LAB — CERVICOVAGINAL ANCILLARY ONLY
Bacterial Vaginitis (gardnerella): NEGATIVE
Candida Glabrata: NEGATIVE
Candida Vaginitis: NEGATIVE
Chlamydia: NEGATIVE
Comment: NEGATIVE
Comment: NEGATIVE
Comment: NEGATIVE
Comment: NEGATIVE
Comment: NEGATIVE
Comment: NORMAL
Neisseria Gonorrhea: NEGATIVE
Trichomonas: NEGATIVE

## 2020-04-09 ENCOUNTER — Ambulatory Visit (HOSPITAL_COMMUNITY)
Admission: RE | Admit: 2020-04-09 | Discharge: 2020-04-09 | Disposition: A | Discharge status: Home / Self Care | Payer: Medicaid Other | Source: Ambulatory Visit | Attending: Family Medicine | Admitting: Family Medicine

## 2020-04-09 ENCOUNTER — Other Ambulatory Visit: Payer: Self-pay

## 2020-04-09 DIAGNOSIS — R102 Pelvic and perineal pain: Secondary | ICD-10-CM | POA: Diagnosis not present

## 2020-04-09 DIAGNOSIS — N858 Other specified noninflammatory disorders of uterus: Secondary | ICD-10-CM | POA: Diagnosis not present

## 2020-04-21 ENCOUNTER — Emergency Department (HOSPITAL_COMMUNITY)
Admission: EM | Admit: 2020-04-21 | Discharge: 2020-04-21 | Disposition: A | Discharge status: Home / Self Care | Payer: Medicaid Other | Attending: Emergency Medicine | Admitting: Emergency Medicine

## 2020-04-21 ENCOUNTER — Emergency Department (HOSPITAL_COMMUNITY): Payer: Medicaid Other

## 2020-04-21 DIAGNOSIS — R0902 Hypoxemia: Secondary | ICD-10-CM | POA: Diagnosis not present

## 2020-04-21 DIAGNOSIS — R102 Pelvic and perineal pain: Secondary | ICD-10-CM | POA: Diagnosis not present

## 2020-04-21 DIAGNOSIS — N800 Endometriosis of uterus: Secondary | ICD-10-CM | POA: Diagnosis not present

## 2020-04-21 DIAGNOSIS — R1031 Right lower quadrant pain: Secondary | ICD-10-CM | POA: Diagnosis not present

## 2020-04-21 DIAGNOSIS — N809 Endometriosis, unspecified: Secondary | ICD-10-CM | POA: Diagnosis not present

## 2020-04-21 DIAGNOSIS — R1084 Generalized abdominal pain: Secondary | ICD-10-CM | POA: Diagnosis not present

## 2020-04-21 DIAGNOSIS — N80129 Deep endometriosis of ovary, unspecified ovary: Secondary | ICD-10-CM

## 2020-04-21 DIAGNOSIS — R52 Pain, unspecified: Secondary | ICD-10-CM | POA: Diagnosis not present

## 2020-04-21 DIAGNOSIS — N83201 Unspecified ovarian cyst, right side: Secondary | ICD-10-CM | POA: Diagnosis not present

## 2020-04-21 DIAGNOSIS — I10 Essential (primary) hypertension: Secondary | ICD-10-CM | POA: Diagnosis not present

## 2020-04-21 DIAGNOSIS — N83291 Other ovarian cyst, right side: Secondary | ICD-10-CM | POA: Diagnosis not present

## 2020-04-21 LAB — COMPREHENSIVE METABOLIC PANEL
ALT: 15 U/L (ref 0–44)
AST: 16 U/L (ref 15–41)
Albumin: 4.1 g/dL (ref 3.5–5.0)
Alkaline Phosphatase: 43 U/L (ref 38–126)
Anion gap: 6 (ref 5–15)
BUN: 7 mg/dL (ref 6–20)
CO2: 24 mmol/L (ref 22–32)
Calcium: 8.8 mg/dL — ABNORMAL LOW (ref 8.9–10.3)
Chloride: 106 mmol/L (ref 98–111)
Creatinine, Ser: 0.53 mg/dL (ref 0.44–1.00)
GFR calc Af Amer: >60 mL/min (ref >60)
GFR calc non Af Amer: >60 mL/min (ref >60)
Glucose, Bld: 106 mg/dL — ABNORMAL HIGH (ref 70–99)
Potassium: 3.9 mmol/L (ref 3.5–5.1)
Sodium: 136 mmol/L (ref 135–145)
Total Bilirubin: 0.5 mg/dL (ref 0.3–1.2)
Total Protein: 7.5 g/dL (ref 6.5–8.1)

## 2020-04-21 LAB — URINALYSIS, ROUTINE W REFLEX MICROSCOPIC
Bacteria, UA: NONE SEEN
Bilirubin Urine: NEGATIVE
Glucose, UA: NEGATIVE mg/dL
Ketones, ur: NEGATIVE mg/dL
Leukocytes,Ua: NEGATIVE
Nitrite: NEGATIVE
Protein, ur: NEGATIVE mg/dL
RBC / HPF: >50 RBC/hpf — ABNORMAL HIGH (ref 0–5)
Specific Gravity, Urine: 1.015 (ref 1.005–1.030)
pH: 7 (ref 5.0–8.0)

## 2020-04-21 LAB — CBC
HCT: 36 % (ref 36.0–46.0)
Hemoglobin: 11.9 g/dL — ABNORMAL LOW (ref 12.0–15.0)
MCH: 28.5 pg (ref 26.0–34.0)
MCHC: 33.1 g/dL (ref 30.0–36.0)
MCV: 86.1 fL (ref 80.0–100.0)
Platelets: 298 10*3/uL (ref 150–400)
RBC: 4.18 MIL/uL (ref 3.87–5.11)
RDW: 13.7 % (ref 11.5–15.5)
WBC: 5.5 10*3/uL (ref 4.0–10.5)
nRBC: 0 % (ref 0.0–0.2)

## 2020-04-21 LAB — LIPASE, BLOOD: Lipase: 22 U/L (ref 11–51)

## 2020-04-21 LAB — PREGNANCY, URINE: Preg Test, Ur: NEGATIVE

## 2020-04-21 MED ORDER — KETOROLAC TROMETHAMINE 30 MG/ML IJ SOLN
30.0000 mg | Freq: Once | INTRAMUSCULAR | Status: AC
  Administered 2020-04-21: 30 mg via INTRAVENOUS
  Filled 2020-04-21: qty 1

## 2020-04-21 NOTE — ED Triage Notes (Signed)
Pt c/o right lower cramping abd pain , occurs every month

## 2020-04-21 NOTE — ED Notes (Signed)
Patient ambulated to the restroom without assistance 

## 2020-04-21 NOTE — Discharge Instructions (Addendum)
You can take the diclofenac that is previously been prescribed to help with the pain follow-up with gynecology

## 2020-04-21 NOTE — ED Provider Notes (Signed)
[facility]-EMERGENCY DEPT Provider Note   CSN: [id number] Arrival date & time: 04/21/20  0255     History Chief Complaint  Patient presents with  . Abdominal Pain    Lori Mays is a 34 y.o. female.  HPI Patient presents with right lower abdominal pain.  Does have a history of monthly pain from her endometriosis.  States she her menses is still going on.  Last 5 days when it normally would last only for.  Has her typical discharge with her menses.  However severe pain right lower abdomen.  States it goes to the back and down her leg.  Worse movements.  Worse with bowel movement.  No fevers.  Has a known endometrioma and endometriosis.  Also had been seen a few weeks ago and diagnosed with ovarian cysts and some fluid in the right tube.  Was treated for possible infection but reviewing cultures they came back negative.  Pain is severe.  Not on pain medicines at home for it.    Past Medical History:  Diagnosis Date  . ASCUS with positive high risk HPV 06/16/2013  . Asthma   . Cyst 06/07/2012  . DVT, lower extremity, proximal ([facility])   . Endometriosis     Patient Active Problem List   Diagnosis Date Noted  . Dysplasia of cervix, 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  . Low grade squamous intraepith lesion on cytologic smear cervix (lgsil) 11/09/2019  . Asthma, chronic 06/22/2013  . Migraine, unspecified 06/22/2013  . Arthralgia 06/13/2013  . Endometriosis 06/13/2013  . Edema of both legs 06/13/2013  . Weight gain 06/13/2013    Past Surgical History:  Procedure Laterality Date  . laproscopy  2011     OB History    Gravida  0   Para  0   Term  0   Preterm  0   AB  0   Living  0     SAB  0   TAB  0   Ectopic  0   Multiple  0   Live Births              Family History  Problem Relation Age of Onset  . Breast cancer Maternal Grandmother   . Brain cancer Paternal Grandmother   . Breast cancer Paternal Grandmother   . Breast cancer  Paternal Aunt     Social History   Tobacco Use  . Smoking status: Never Smoker  . Smokeless tobacco: Never Used  Substance Use Topics  . Alcohol use: Yes    Alcohol/week: 1.0 - 2.0 standard drinks    Types: 1 - 2 Glasses of wine per week    Comment: occassionally  . Drug use: No    Home Medications Prior to Admission medications   Medication Sig Start Date End Date Taking? Authorizing Provider  diclofenac (VOLTAREN) 75 MG EC tablet Take 1 tablet (75 mg total) by mouth 2 (two) times daily with a meal. 03/28/20   Reva Bores, MD  Ibuprofen (ADVIL PO) Take by mouth.    [provider]  norethindrone (AYGESTIN) 5 MG tablet Take 1 tablet (5 mg total) by mouth daily. 03/28/20   Reva Bores, MD  polyethylene glycol powder (GLYCOLAX/MIRALAX) 17 GM/SCOOP powder Take 17 g by mouth 2 (two) times daily as needed. 08/09/19   Claiborne Rigg, NP  VENTOLIN HFA 108 (90 Base) MCG/ACT inhaler INHALE 2 PUFFS INTO THE LUNGS EVERY 6 (SIX) HOURS AS NEEDED FOR WHEEZING OR SHORTNESS  OF BREATH. Patient not taking: Reported on 03/28/2020 03/18/19   Gildardo Pounds, NP  Vitamin D, Ergocalciferol, (DRISDOL) 1.25 MG (50000 UT) CAPS capsule Take 1 capsule (50,000 Units total) by mouth every 7 (seven) days. Patient not taking: Reported on 03/28/2020 05/23/19   Gildardo Pounds, NP    Allergies    Diphenhydramine hcl and Latex  Review of Systems   Review of Systems  Constitutional: Negative for appetite change.  Respiratory: Negative for shortness of breath.   Cardiovascular: Negative for chest pain.  Gastrointestinal: Positive for abdominal pain and constipation.  Genitourinary: Positive for vaginal bleeding. Negative for frequency.  Musculoskeletal: Negative for back pain.  Skin: Negative for rash.  Neurological: Negative for weakness.  Psychiatric/Behavioral: Negative for confusion.    Physical Exam Updated Vital Signs BP (!) 149/105   Pulse 69   Temp 98.9 F (37.2 C) (Oral)   Resp 18    SpO2 100%   Physical Exam Vitals and nursing note reviewed.  Constitutional:      Comments: Patient appears uncomfortable.  HENT:     Head: Normocephalic.  Cardiovascular:     Rate and Rhythm: Normal rate.  Pulmonary:     Breath sounds: Normal breath sounds.  Abdominal:     Hernia: No hernia is present.     Comments: Suprapubic and right lower quadrant tenderness.  Moderate.  Skin:    General: Skin is warm.     Capillary Refill: Capillary refill takes less than 2 seconds.  Neurological:     Mental Status: She is alert and oriented to person, place, and time.     ED Results / Procedures / Treatments   Labs (all labs ordered are listed, but only abnormal results are displayed) Labs Reviewed  COMPREHENSIVE METABOLIC PANEL - Abnormal; Notable for the following components:      Result Value   Glucose, Bld 106 (*)    Calcium 8.8 (*)    All other components within normal limits  CBC - Abnormal; Notable for the following components:   Hemoglobin 11.9 (*)    All other components within normal limits  LIPASE, BLOOD  PREGNANCY, URINE  URINALYSIS, ROUTINE W REFLEX MICROSCOPIC    EKG None  Radiology No results found.  Procedures Procedures (including critical care time)  Medications Ordered in ED Medications  ketorolac (TORADOL) 30 MG/ML injection 30 mg (has no administration in time range)    ED Course  I have reviewed the triage vital signs and the nursing notes.  Pertinent labs & imaging results that were available during my care of the patient were reviewed by me and considered in my medical decision making (see chart for details).    MDM Rules/Calculators/A&P                      Patient with acute on chronic pelvic pain.  History of ovarian cysts and endometrioma.  Ultrasound done.  Cyst smaller than a few weeks ago with last ultrasound.  Endometrioma also present.  Patient feels somewhat better.  Feels it is a cyst.  Reviewed records and negative cultures  on recent pelvic exam.  Will not repeat.  Likely either cyst or endometrioma doing the pain.  Will have follow-up with GYN.  Discharge home. Final Clinical Impression(s) / ED Diagnoses Final diagnoses:  Pelvic pain    Rx / DC Orders ED Discharge Orders    None       Davonna Belling, MD 04/21/20 1019

## 2020-04-23 ENCOUNTER — Encounter: Payer: Self-pay | Admitting: Nurse Practitioner

## 2020-04-25 NOTE — Telephone Encounter (Signed)
Patient called in and requested for a gastroenterology referral to be placed. Preferable [facility] or any place her insurance would accept. Please follow up at your earliest convenience.

## 2020-04-25 NOTE — Telephone Encounter (Signed)
Will route to PCP 

## 2020-04-26 DIAGNOSIS — Z20828 Contact with and (suspected) exposure to other viral communicable diseases: Secondary | ICD-10-CM | POA: Diagnosis not present

## 2020-04-26 DIAGNOSIS — Z03818 Encounter for observation for suspected exposure to other biological agents ruled out: Secondary | ICD-10-CM | POA: Diagnosis not present

## 2020-04-27 ENCOUNTER — Encounter: Payer: Self-pay | Admitting: Gastroenterology

## 2020-04-27 ENCOUNTER — Other Ambulatory Visit: Payer: Self-pay | Admitting: Nurse Practitioner

## 2020-04-27 DIAGNOSIS — K5909 Other constipation: Secondary | ICD-10-CM

## 2020-05-02 ENCOUNTER — Encounter: Payer: Self-pay | Admitting: Family Medicine

## 2020-05-02 ENCOUNTER — Ambulatory Visit (INDEPENDENT_AMBULATORY_CARE_PROVIDER_SITE_OTHER): Payer: Medicaid Other | Admitting: Family Medicine

## 2020-05-02 ENCOUNTER — Other Ambulatory Visit: Payer: Self-pay

## 2020-05-02 VITALS — Wt 153.5 lb

## 2020-05-02 DIAGNOSIS — Z86718 Personal history of other venous thrombosis and embolism: Secondary | ICD-10-CM | POA: Insufficient documentation

## 2020-05-02 DIAGNOSIS — N809 Endometriosis, unspecified: Secondary | ICD-10-CM | POA: Diagnosis not present

## 2020-05-02 DIAGNOSIS — Z1331 Encounter for screening for depression: Secondary | ICD-10-CM

## 2020-05-02 LAB — POCT URINALYSIS DIP (DEVICE)
Bilirubin Urine: NEGATIVE
Glucose, UA: NEGATIVE mg/dL
Ketones, ur: NEGATIVE mg/dL
Leukocytes,Ua: NEGATIVE
Nitrite: NEGATIVE
Protein, ur: NEGATIVE mg/dL
Specific Gravity, Urine: >=1.03 (ref 1.005–1.030)
Urobilinogen, UA: 0.2 mg/dL (ref 0.0–1.0)
pH: 7 (ref 5.0–8.0)

## 2020-05-02 MED ORDER — ORILISSA 200 MG PO TABS: 1.0000 | ORAL_TABLET | Freq: Two times a day (BID) | ORAL | 2 refills | Status: DC

## 2020-05-02 NOTE — Progress Notes (Signed)
Subjective:    Patient ID: Lori Mays is a 34 y.o. female presenting with No chief complaint on file.  on 05/02/2020  HPI: Patient with long h/o pelvic pain. Previously underwent laparoscopy in 2010 and noted to have stage 3 endometriosis. Ovaries were stuck in the posterior cul-de-sac with clubbed tubal ends at that time. Then on DepoLupron for a long time, with too many side effects for her to continue. Her pain is quite persistent and she is a G0. She is interested in trying to preserve her fertility. Taking tylenol and Advil only. Placed on Aygestin and did not start this following her last visit.  She has h/o DVT unprovoked and is not a candidate for estrogen at all. She does use heat and nothing is really helping her pain. She is quite uncomfortable and almost non-functional due to pain. She is interested in possible surgery.  Review of Systems  Constitutional: Negative for chills and fever.  Respiratory: Negative for shortness of breath.   Cardiovascular: Negative for chest pain.  Gastrointestinal: Negative for abdominal pain, nausea and vomiting.  Genitourinary: Negative for dysuria.  Skin: Negative for rash.      Objective:    Wt 153 lb 8 oz (69.6 kg)   LMP 04/24/2020 (Approximate)   BMI 23.34 kg/m  Physical Exam Constitutional:      General: She is not in acute distress.    Appearance: She is well-developed.  HENT:     Head: Normocephalic and atraumatic.  Eyes:     General: No scleral icterus. Cardiovascular:     Rate and Rhythm: Normal rate.  Pulmonary:     Effort: Pulmonary effort is normal.  Abdominal:     Palpations: Abdomen is soft.  Musculoskeletal:     Cervical back: Neck supple.  Skin:    General: Skin is warm and dry.  Neurological:     Mental Status: She is alert and oriented to person, place, and time.   Genitourinary:    Cervix: Cervical motion tenderness present.     Uterus: Tender.      Adnexa:        Right: Tenderness present.         Left: Tenderness present.   Last u/s 04/21/2020 Uterus Measurements: 7.6 x 3.7 x 4.3 cm = volume: 63 mL. No fibroids or other mass visualized.  Endometrium  Thickness: 6 mm. Unchanged appearance since the prior study with minimal prominence at the tip.  Right ovary Measurements: 5.3 x 3.4 x 4.5 cm = volume: 42 mL. A 3.2 x 2.5 x 2.5 cm minimally complicated cyst is noted, previously measuring 4.3 x 2.7 x 3.6 cm on 04/09/2020.  Left ovary Measurements: 2.1 x 1.6 x 1.4 cm = volume: 2.6 mL. A 5.5 x 5.3 x 5.3 cm homogeneously hypoechoic round structure in the LEFT adnexal region, posterior to the uterus is noted, stable to minimally decreased in size. No internal vascular flow is noted.  Pulsed Doppler evaluation of both ovaries demonstrates normal low-resistance arterial and venous waveforms.  Other findings A trace amount of free pelvic fluid is noted.  IMPRESSION: 1. Decreased size of complicated RIGHT ovarian cyst compatible with a benign/hemorrhagic cyst. 2. Little significant change in 5.5 cm homogeneously hypoechoic round structure in the LEFT adnexal region, suspicious for endometrioma. 3. No evidence of ovarian torsion. 4. Trace amount of free pelvic fluid.    Assessment & Plan:   Problem List Items Addressed This Visit      Unprioritized   Endometriosis  Would prefer referral to REI for discussion of short and long term goals of treatment, preservation of fertility and possible surgical treatment by them, considering what they and she might need, for fertility to still be an option. Also, I think a robotic exploration would be better than straight stick laparoscopy given the ongoing pain, issues as well as what was found during her last surgery. Will refer to MIS surgeon at [facility]. Will also start on Orilissa 200 mg bid. Added Diclofenac for pain. Not to take with her Ibuprofen.      Relevant Medications   Elagolix Sodium (ORILISSA) 200 MG TABS   Other  Relevant Orders   Ambulatory referral to Endocrinology   History of DVT (deep vein thrombosis)    Not a candidate for hormones. Will hold Aygestin.       Other Visit Diagnoses    Positive depression screening    -  Primary   Relevant Orders   Ambulatory referral to Integrated Behavioral [facility]      Total time: 33 minutes.  Return in about 6 months (around 11/02/2020), or if symptoms worsen or fail to improve.  Reva Bores 05/03/2020 8:57 AM

## 2020-05-02 NOTE — Patient Instructions (Signed)
Endometriosis  Endometriosis is a condition in which the tissue that lines the uterus (endometrium) grows outside of its normal location. The tissue may grow in many locations close to the uterus, but it commonly grows on the ovaries, fallopian tubes, vagina, or bowel. When the uterus sheds the endometrium every menstrual cycle, there is bleeding wherever the endometrial tissue is located. This can cause pain because blood is irritating to tissues that are not normally exposed to it. What are the causes? The cause of endometriosis is not known. What increases the risk? You may be more likely to develop endometriosis if you:  Have a family history of endometriosis.  Have never given birth.  Started your period at age 10 or younger.  Have high levels of estrogen in your body.  Were exposed to a certain medicine (diethylstilbestrol) before you were born (in utero).  Had low birth weight.  Were born as a twin, triplet, or other multiple.  Have a BMI of less than 25. BMI is an estimate of body fat and is calculated from height and weight. What are the signs or symptoms? Often, there are no symptoms of this condition. If you do have symptoms, they may:  Vary depending on where your endometrial tissue is growing.  Occur during your menstrual period (most common) or midcycle.  Come and go, or you may go months with no symptoms at all.  Stop with menopause. Symptoms may include:  Pain in the back or abdomen.  Heavier bleeding during periods.  Pain during sex.  Painful bowel movements.  Infertility.  Pelvic pain.  Bleeding more than once a month. How is this diagnosed? This condition is diagnosed based on your symptoms and a physical exam. You may have tests, such as:  Blood tests and urine tests. These may be done to help rule out other possible causes of your symptoms.  Ultrasound, to look for abnormal tissues.  An X-ray of the lower bowel (barium enema).  An  ultrasound that is done through the vagina (transvaginally).  CT scan.  MRI.  Laparoscopy. In this procedure, a lighted, pencil-sized instrument called a laparoscope is inserted into your abdomen through an incision. The laparoscope allows your health care provider to look at the organs inside your body and check for abnormal tissue to confirm the diagnosis. If abnormal tissue is found, your health care provider may remove a small piece of tissue (biopsy) to be examined under a microscope. How is this treated? Treatment for this condition may include:  Medicines to relieve pain, such as NSAIDs.  Hormone therapy. This involves using artificial (synthetic) hormones to reduce endometrial tissue growth. Your health care provider may recommend using a hormonal form of birth control, or other medicines.  Surgery. This may be done to remove abnormal endometrial tissue. ? In some cases, tissue may be removed using a laparoscope and a laser (laparoscopic laser treatment). ? In severe cases, surgery may be done to remove the fallopian tubes, uterus, and ovaries (hysterectomy). Follow these instructions at home:  Take over-the-counter and prescription medicines only as told by your health care provider.  Do not drive or use heavy machinery while taking prescription pain medicine.  Try to avoid activities that cause pain, including sexual activity.  Keep all follow-up visits as told by your health care provider. This is important. Contact a health care provider if:  You have pain in the area between your hip bones (pelvic area) that occurs: ? Before, during, or after your period. ?   In between your period and gets worse during your period. ? During or after sex. ? With bowel movements or urination, especially during your period.  You have problems getting pregnant.  You have a fever. Get help right away if:  You have severe pain that does not get better with medicine.  You have severe  nausea and vomiting, or you cannot eat without vomiting.  You have pain that affects only the lower, right side of your abdomen.  You have abdominal pain that gets worse.  You have abdominal swelling.  You have blood in your stool. This information is not intended to replace advice given to you by your health care provider. Make sure you discuss any questions you have with your health care provider. Document Revised: 11/27/2017 Document Reviewed: 05/17/2016 Elsevier Patient Education  2020 Elsevier Inc.  

## 2020-05-03 ENCOUNTER — Encounter: Payer: Self-pay | Admitting: Family Medicine

## 2020-05-03 NOTE — Assessment & Plan Note (Addendum)
Would prefer referral to [facility] for discussion of short and long term goals of treatment, preservation of fertility and possible surgical treatment by them, considering what they and she might need, for fertility to still be an option. She has Medicaid and cannot get into the one in [place] based on this. Also, I think a robotic exploration would be better than straight stick laparoscopy given the ongoing pain, issues as well as what was found during her last surgery. Will refer to MIS surgeon at [facility]. Will also start on Orilissa 200 mg bid. Added Diclofenac for pain. Not to take with her Ibuprofen.

## 2020-05-03 NOTE — Assessment & Plan Note (Signed)
Not a candidate for hormones. Will hold Aygestin.

## 2020-05-03 NOTE — BH Specialist Note (Signed)
Integrated Behavioral Health via Telemedicine Phone Visit  05/03/2020 Lori Mays [id number]  Number of Integrated Behavioral Health visits: 1 Session Start time: 2:20  Session End time: 3:13 Total time: 52  Referring Provider: Tinnie Gens, MD Type of Visit: Phone Patient/Family location: Home [facility] Provider location: [facility]  All persons participating in visit: Patient Lori Mays and Lori Mays    Confirmed patient's address: Yes  Confirmed patient's phone number: Yes  Any changes to demographics: No   Confirmed patient's insurance: Yes  Any changes to patient's insurance: No   Discussed confidentiality: Yes   I connected with Lori Mays  by a video enabled telemedicine application and verified that I am speaking with the correct person using two identifiers.     I discussed the limitations of evaluation and management by telemedicine and the availability of in person appointments.  I discussed that the purpose of this visit is to provide behavioral health care while limiting exposure to the novel coronavirus.   Discussed there is a possibility of technology failure and discussed alternative modes of communication if that failure occurs.  I discussed that engaging in this virtual visit, they consent to the provision of behavioral healthcare and the services will be billed under their insurance.  Patient and/or legal guardian expressed understanding and consented to video visit: Yes   PRESENTING CONCERNS: Patient and/or family reports the following symptoms/concerns: Pt states her primary concern today is pain from endometriosis and ovarian cyst, along with worry about blood clot and circulation issues all attributing to fatigue, depression, anxiety, sleep difficulty, and inability to relax. 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 Duration of problem: Increasing pain over time; Severity of problem: moderately severe  STRENGTHS (Protective Factors/Coping Skills):  Self-aware, self-advocacy skills, hopeful outlook  GOALS ADDRESSED: Patient will: 1.  Reduce symptoms of: anxiety, depression, insomnia and stress  2.  Increase knowledge and/or ability of: healthy habits and self-management skills  3.  Demonstrate ability to: Increase healthy adjustment to current life circumstances  INTERVENTIONS: Interventions utilized:  Copywriter, advertising, Psychoeducation and/or Health Education and Link to [facility] Standardized Assessments completed: PHQ9/GAD7 given in past two weeks  ASSESSMENT: Patient currently experiencing Adjustment disorder with mixed anxiety and depressed mood.   Patient may benefit from psychoeducation and brief therapeutic interventions regarding coping with symptoms of depression, anxiety .  PLAN: 1. Follow up with behavioral health clinician on : Two weeks 2. Behavioral recommendations:  -CALM relaxation breathing exercise twice daily (morning; at bedtime) -Consider using apps as distraction on milder pain days -Consider taking 1 minute walks twice daily, even on high pain days (in house on bad weather days); longer walks on good days -Consider Caregiver Support group, as discussed (on After Visit Summary) 3. Referral(s): Integrated [facility] (In Clinic) and [employer]:  Caregiver support  I discussed the assessment and treatment plan with the patient and/or parent/guardian. They were provided an opportunity to ask questions and all were answered. They agreed with the plan and demonstrated an understanding of the instructions.   They were advised to call back or seek an in-person evaluation if the symptoms worsen or if the condition fails to improve as anticipated.  [facility]  Depression screen [facility] 2/9 05/02/2020  09/09/2019 02/15/2019 01/05/2019 09/03/2017  Decreased Interest 2 1 0 0 3  Down, Depressed, Hopeless 3 0 0 1 1  PHQ - 2 Score 5 1 0 1 4  Altered sleeping 3 3 1  - 2  Tired, decreased energy 3 2 1  - 2  Change in appetite 2 2 0 - 3  Feeling bad or failure about yourself  0 1 0 - 1  Trouble concentrating 2 0 0 - 1  Moving slowly or fidgety/restless 0 0 0 - 0  Suicidal thoughts 0 0 0 - 0  PHQ-9 Score 15 9 2  - 13   GAD 7 : Generalized Anxiety Score 05/02/2020 09/09/2019 02/15/2019 01/05/2019  Nervous, Anxious, on Edge 3 0 0 0  Control/stop worrying 2 1 1  0  Worry too much - different things 1 2 1  0  Trouble relaxing 3 3 0 0  Restless 1 0 0 0  Easily annoyed or irritable 1 2 1 1   Afraid - awful might happen 1 1 0 0  Total GAD 7 Score 12 9 3 1   Anxiety Difficulty - - - Not difficult at all

## 2020-05-09 ENCOUNTER — Ambulatory Visit (INDEPENDENT_AMBULATORY_CARE_PROVIDER_SITE_OTHER)
Admission: RE | Admit: 2020-05-09 | Discharge: 2020-05-09 | Disposition: A | Discharge status: Home / Self Care | Payer: Medicaid Other | Source: Ambulatory Visit | Attending: Gastroenterology | Admitting: Gastroenterology

## 2020-05-09 ENCOUNTER — Encounter: Payer: Self-pay | Admitting: Gastroenterology

## 2020-05-09 ENCOUNTER — Ambulatory Visit: Payer: Medicaid Other | Admitting: Gastroenterology

## 2020-05-09 ENCOUNTER — Other Ambulatory Visit (INDEPENDENT_AMBULATORY_CARE_PROVIDER_SITE_OTHER): Payer: Medicaid Other

## 2020-05-09 ENCOUNTER — Other Ambulatory Visit: Payer: Self-pay

## 2020-05-09 VITALS — BP 104/74 | HR 80 | Temp 98.3°F | Ht 68.0 in | Wt 154.0 lb

## 2020-05-09 DIAGNOSIS — K5909 Other constipation: Secondary | ICD-10-CM

## 2020-05-09 DIAGNOSIS — K5900 Constipation, unspecified: Secondary | ICD-10-CM | POA: Diagnosis not present

## 2020-05-09 LAB — TSH: TSH: 0.7 u[IU]/mL (ref 0.35–4.50)

## 2020-05-09 MED ORDER — LINACLOTIDE 290 MCG PO CAPS: 290.0000 ug | ORAL_CAPSULE | Freq: Every day | ORAL | 2 refills | Status: DC

## 2020-05-09 NOTE — Progress Notes (Signed)
05/09/2020 Ralene Ok [id number] Sep 21, 1986   HISTORY OF PRESENT ILLNESS: This is a 34 year old female who is new to our office.  She has been referred here by Dr. Shawnie Pons, for evaluation regarding chronic constipation.  She tells me that the constipation has been going on for years, but has worsened over the past year.  She says that she almost does not poop often at all.  She says that last weekend she had a large bowel movement, but now has not gone again for the past several days.  She says that she has tried anything and everything over-the-counter.  She says that she has tried the holistic approach with drinking ginger tea, drinking hot water, drinking coffee.  She uses a heating pad on her abdomen.  Says that she tried MiraLAX daily and sometimes even twice a day for about a year or so.  She also says that she tried probiotics and kombucha.  She tells me that she drank a bottle of magnesium citrate once and it took a couple of days even after that before she had a bowel movement.  She reports lower abdominal pain, but she has stage IV endometriosis and it is sometimes hard to distinguish her pain is from endometriosis or constipation.  Has seen bright red blood in association with the constipation as well.  CBC, CMP, lipase are all within normal limits.  Past Medical History:  Diagnosis Date  . ASCUS with positive high risk HPV 06/16/2013  . Asthma   . Cyst 06/07/2012  . DVT, lower extremity, proximal (HCC)   . Endometriosis    Past Surgical History:  Procedure Laterality Date  . laproscopy  2011    reports that she has never smoked. She has never used smokeless tobacco. She reports previous alcohol use of about 1.0 - 2.0 standard drinks of alcohol per week. She reports that she does not use drugs. family history includes Brain cancer in her paternal grandmother; Breast cancer in her maternal grandmother, paternal aunt, and paternal grandmother. Allergies  Allergen Reactions  .  Diphenhydramine Hcl     Asthma attacks  . Latex Itching      Outpatient Encounter Medications as of 05/09/2020  Medication Sig  . acetaminophen (TYLENOL) 500 MG tablet Take 500 mg by mouth every 6 (six) hours as needed for moderate pain.  Marland Kitchen diclofenac (VOLTAREN) 75 MG EC tablet Take 1 tablet (75 mg total) by mouth 2 (two) times daily with a meal.  . Elagolix Sodium (ORILISSA) 200 MG TABS Take 1 tablet by mouth in the morning and at bedtime.  Marland Kitchen ibuprofen (ADVIL) 200 MG tablet Take 200 mg by mouth every 6 (six) hours as needed for moderate pain.  Marland Kitchen ondansetron (ZOFRAN-ODT) 8 MG disintegrating tablet Take 8 mg by mouth every 8 (eight) hours as needed for nausea or vomiting.  Marland Kitchen oxyCODONE-acetaminophen (PERCOCET/ROXICET) 5-325 MG tablet Take 0.5-1 tablets by mouth every 6 (six) hours as needed for moderate pain or severe pain.  . polyethylene glycol powder (GLYCOLAX/MIRALAX) 17 GM/SCOOP powder Take 17 g by mouth 2 (two) times daily as needed.  . VENTOLIN HFA 108 (90 Base) MCG/ACT inhaler INHALE 2 PUFFS INTO THE LUNGS EVERY 6 (SIX) HOURS AS NEEDED FOR WHEEZING OR SHORTNESS OF BREATH.  Marland Kitchen Vitamin D, Ergocalciferol, (DRISDOL) 1.25 MG (50000 UT) CAPS capsule Take 1 capsule (50,000 Units total) by mouth every 7 (seven) days.   No facility-administered encounter medications on file as of 05/09/2020.     REVIEW OF  SYSTEMS  : All other systems reviewed and negative except where noted in the History of Present Illness.   PHYSICAL EXAM: BP 104/74   Pulse 80   Temp 98.3 F (36.8 C)   Ht 5\' 8"  (1.727 m)   Wt 154 lb (69.9 kg)   LMP 04/24/2020 (Approximate)   BMI 23.42 kg/m  General: Well developed AA female in no acute distress Head: Normocephalic and atraumatic Eyes:  Sclerae anicteric, conjunctiva pink. Ears: Normal auditory acuity Lungs: Clear throughout to auscultation; no increased WOB. Heart: Regular rate and rhythm; no M/R/G. Abdomen: Soft, non-distended.  BS present.  Mild diffuse TTP,  more so in the LLQ. Musculoskeletal: Symmetrical with no gross deformities  Skin: No lesions on visible extremities Extremities: No edema  Neurological: Alert oriented x 4, grossly non-focal Psychological:  Alert and cooperative. Normal mood and affect  ASSESSMENT AND PLAN: *34 year old female with complaints of constipation and lower abdominal pain.  Constipation has been chronic, but worsening recently.  Also has "stage 4 endometriosis", which obviously causes her pain as well.  Has failed multiple over-the-counter regimens.  Going to check a TSH in a.m. abdominal x-ray today.  May need bowel purge to get things moving and then I would like her to begin Linzess 290 mcg daily.  Prescription samples given.   CC:  Gildardo Pounds, NP

## 2020-05-09 NOTE — Patient Instructions (Signed)
Your provider has requested that you go to the basement level for lab work before leaving today. Press "B" on the elevator. The lab is located at the first door on the left as you exit the elevator.  Your provider has requested that you have an abdominal x ray before leaving today. Please go to the basement floor to our Radiology department for the test.  We have sent the following medications to your pharmacy for you to pick up at your convenience: Linzess   Samples-  Linzess  - Take once daily.   If you are age 11 or older, your body mass index should be between 23-30. Your Body mass index is 23.42 kg/m. If this is out of the aforementioned range listed, please consider follow up with your Primary Care Provider.  If you are age 58 or younger, your body mass index should be between 19-25. Your Body mass index is 23.42 kg/m. If this is out of the aformentioned range listed, please consider follow up with your Primary Care Provider.   Thank you for choosing me and [facility].  Shanda Bumps Zehr-PA

## 2020-05-09 NOTE — Progress Notes (Signed)
I agree with the above note, plan 

## 2020-05-10 ENCOUNTER — Ambulatory Visit (INDEPENDENT_AMBULATORY_CARE_PROVIDER_SITE_OTHER): Payer: Medicaid Other | Admitting: Clinical

## 2020-05-10 ENCOUNTER — Other Ambulatory Visit: Payer: Self-pay

## 2020-05-10 DIAGNOSIS — K5909 Other constipation: Secondary | ICD-10-CM

## 2020-05-10 DIAGNOSIS — F4323 Adjustment disorder with mixed anxiety and depressed mood: Secondary | ICD-10-CM

## 2020-05-10 MED ORDER — POLYETHYLENE GLYCOL 3350 17 GM/SCOOP PO POWD: 17.0000 g | ORAL | 1 refills | Status: DC

## 2020-05-10 NOTE — Patient Instructions (Addendum)
   [employer] Caregiver Support https://www.well-springsolutions.org/caregiver-education/caregiver-support-group/ 8190354834   /Emotional Wellbeing Apps and Websites Here are a few free apps meant to help you to help yourself.  To find, try searching on the internet to see if the app is offered on Apple/Android devices. If your first choice doesn't come up on your device, the good news is that there are many choices! Play around with different apps to see which ones are helpful to you.    Calm This is an app meant to help increase calm feelings. Includes info, strategies, and tools for tracking your feelings.      Calm Harm  This app is meant to help with self-harm. Provides many 5-minute or 15-min coping strategies for doing instead of hurting yourself.       Healthy Minds Health Minds is a problem-solving tool to help deal with emotions and cope with stress you encounter wherever you are.      MindShift This app can help people cope with anxiety. Rather than trying to avoid anxiety, you can make an important shift and face it.      MY3  MY3 features a support system, safety plan and resources with the goal of offering a tool to use in a time of need.       My Life My Voice  This mood journal offers a simple solution for tracking your thoughts, feelings and moods. Animated emoticons can help identify your mood.       Relax Melodies Designed to help with sleep, on this app you can mix sounds and meditations for relaxation.      Smiling Mind Smiling Mind is meditation made easy: it's a simple tool that helps put a smile on your mind.        Stop, Breathe & Think  A friendly, simple guide for people through meditations for mindfulness and compassion.  Stop, Breathe and Think Kids Enter your current feelings and choose a "mission" to help you cope. Offers videos for certain moods instead of just sound recordings.       Team Orange The goal of this tool is  to help teens change how they think, act, and react. This app helps you focus on your own good feelings and experiences.      The [employer] Box The [employer] Box (VHB) contains simple tools to help patients with coping, relaxation, distraction, and positive thinking.

## 2020-05-17 ENCOUNTER — Other Ambulatory Visit: Payer: Self-pay | Admitting: General Practice

## 2020-05-17 ENCOUNTER — Encounter: Payer: Self-pay | Admitting: General Practice

## 2020-05-17 DIAGNOSIS — Z86718 Personal history of other venous thrombosis and embolism: Secondary | ICD-10-CM

## 2020-05-17 DIAGNOSIS — N809 Endometriosis, unspecified: Secondary | ICD-10-CM

## 2020-05-17 MED ORDER — MELOXICAM 7.5 MG PO TABS: 7.5000 mg | ORAL_TABLET | Freq: Every day | ORAL | 2 refills | Status: DC

## 2020-05-17 NOTE — BH Specialist Note (Signed)
Integrated Behavioral Health via Telemedicine Video Visit  05/17/2020 Lori Mays [id number]  Number of Integrated Behavioral Health visits: 2 Session Start time: 3:48  Session End time: 4:14 Total time: 26  Referring Provider: Darron Doom, MD Type of Visit: Video Patient/Family location: Home [facility] Provider location: [facility] for [place] [facility]  All persons participating in visit: Patient Lori "Hurshel Party" Mays and Ajo    Confirmed patient's address: Yes  Confirmed patient's phone number: Yes  Any changes to demographics: No   Confirmed patient's insurance: Yes  Any changes to patient's insurance: No   Discussed confidentiality: at previous visit  I connected with Lori Mays  by a video enabled telemedicine application and verified that I am speaking with the correct person using two identifiers.     I discussed the limitations of evaluation and management by telemedicine and the availability of in person appointments.  I discussed that the purpose of this visit is to provide behavioral health care while limiting exposure to the novel coronavirus.   Discussed there is a possibility of technology failure and discussed alternative modes of communication if that failure occurs.  I discussed that engaging in this video visit, they consent to the provision of behavioral healthcare and the services will be billed under their insurance.  Patient and/or legal guardian expressed understanding and consented to video visit: Yes   PRESENTING CONCERNS: Patient and/or family reports the following symptoms/concerns: Pt states she implementing many self-coping strategies, taking medication as prescribed for medical issues, and pacing her activities, and feeling hopeful for the future; pt is actively working towards her life goals.  Duration of problem: Increasing pain over time; Severity of problem: mild  STRENGTHS (Protective  Factors/Coping Skills): Self-aware, self-advocacy, hopeful outlook; implementing self-coping strategies and working incrementally towards her life goals  GOALS ADDRESSED: Patient will: 1.  Maintain reduction of symptoms of: anxiety, depression, insomnia and stress  2.  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   Patient may benefit from continued psychoeducation and brief therapeutic interventions regarding coping with symptoms of anxiety and depression .  PLAN: 1. Follow up with behavioral health clinician on : As needed. Call Saed Hudlow at [phone] for any follow up visits 2. Behavioral recommendations:  -Continue using daily self-coping strategies (relaxation breathing, short walks as pain level permits, apps, and prioritizing self-care) -Continue to consider caregiver support group, as needed -Continue to work towards short-term life goals one step at a time (driver's license, job, and apartment by the end of summer 2021) 3. Referral(s): [place] (In Clinic)  I discussed the assessment and treatment plan with the patient and/or parent/guardian. They were provided an opportunity to ask questions and all were answered. They agreed with the plan and demonstrated an understanding of the instructions.   They were advised to call back or seek an in-person evaluation if the symptoms worsen or if the condition fails to improve as anticipated.  [facility]  Depression screen [facility] 2/9 05/24/2020 05/02/2020 09/09/2019 02/15/2019 01/05/2019  Decreased Interest 0 2 1 0 0  Down, Depressed, Hopeless 0 3 0 0 1  PHQ - 2 Score 0 5 1 0 1  Altered sleeping 1 3 3 1  -  Tired, decreased energy 1 3 2 1  -  Change in appetite 2  2 2 0 -  Feeling bad or failure about yourself  0 0 1 0 -  Trouble concentrating 0 2 0 0 -  Moving slowly or fidgety/restless 0 0 0 0 -  Suicidal thoughts 0 0 0 0 -  PHQ-9 Score 4 15 9 2  -   GAD 7 : Generalized Anxiety Score 05/24/2020 05/02/2020 09/09/2019 02/15/2019  Nervous, Anxious, on Edge 0 3 0 0  Control/stop worrying 0 2 1 1   Worry too much - different things 0 1 2 1   Trouble relaxing 0 3 3 0  Restless 1 1 0 0  Easily annoyed or irritable 2 1 2 1   Afraid - awful might happen 1 1 1  0  Total GAD 7 Score 4 12 9 3   Anxiety Difficulty - - - -

## 2020-05-24 ENCOUNTER — Other Ambulatory Visit: Payer: Self-pay

## 2020-05-24 ENCOUNTER — Ambulatory Visit (INDEPENDENT_AMBULATORY_CARE_PROVIDER_SITE_OTHER): Payer: Medicaid Other | Admitting: Clinical

## 2020-05-24 DIAGNOSIS — F4323 Adjustment disorder with mixed anxiety and depressed mood: Secondary | ICD-10-CM

## 2020-05-24 NOTE — Patient Instructions (Signed)
[facility] [address] [place], [place] [address] [phone] (main office) [phone] (Romanda Turrubiates's office)   

## 2020-05-28 ENCOUNTER — Encounter: Payer: Self-pay | Admitting: Nurse Practitioner

## 2020-06-18 DIAGNOSIS — N809 Endometriosis, unspecified: Secondary | ICD-10-CM | POA: Diagnosis not present

## 2020-06-18 DIAGNOSIS — Z79899 Other long term (current) drug therapy: Secondary | ICD-10-CM | POA: Diagnosis not present

## 2020-06-18 DIAGNOSIS — Z760 Encounter for issue of repeat prescription: Secondary | ICD-10-CM | POA: Diagnosis not present

## 2020-06-18 DIAGNOSIS — Z87410 Personal history of cervical dysplasia: Secondary | ICD-10-CM | POA: Diagnosis not present

## 2020-06-18 DIAGNOSIS — Z113 Encounter for screening for infections with a predominantly sexual mode of transmission: Secondary | ICD-10-CM | POA: Diagnosis not present

## 2020-06-18 DIAGNOSIS — N979 Female infertility, unspecified: Secondary | ICD-10-CM | POA: Diagnosis not present

## 2020-06-18 DIAGNOSIS — N939 Abnormal uterine and vaginal bleeding, unspecified: Secondary | ICD-10-CM | POA: Diagnosis not present

## 2020-06-18 DIAGNOSIS — R110 Nausea: Secondary | ICD-10-CM | POA: Diagnosis not present

## 2020-06-26 ENCOUNTER — Ambulatory Visit: Payer: Medicaid Other | Admitting: Nurse Practitioner

## 2020-06-28 DIAGNOSIS — Z419 Encounter for procedure for purposes other than remedying health state, unspecified: Secondary | ICD-10-CM | POA: Diagnosis not present

## 2020-07-06 ENCOUNTER — Encounter: Payer: Self-pay | Admitting: Nurse Practitioner

## 2020-07-09 ENCOUNTER — Telehealth: Payer: Self-pay | Admitting: Clinical

## 2020-07-09 ENCOUNTER — Other Ambulatory Visit: Payer: Self-pay | Admitting: Nurse Practitioner

## 2020-07-09 DIAGNOSIS — J4520 Mild intermittent asthma, uncomplicated: Secondary | ICD-10-CM

## 2020-07-09 MED ORDER — ALBUTEROL SULFATE HFA 108 (90 BASE) MCG/ACT IN AERS: 1.0000 | INHALATION_SPRAY | Freq: Four times a day (QID) | RESPIRATORY_TRACT | 1 refills | Status: DC | PRN

## 2020-07-09 NOTE — Telephone Encounter (Signed)
Call back to pt (called on 07/03/20 requesting appointment with [facility]); agreed to virtual visit on 07/18/20@2 :15pm.

## 2020-07-11 NOTE — BH Specialist Note (Signed)
Integrated Behavioral Health via Telemedicine Video (Caregility) Visit  07/11/2020 Lori Mays [id number]  Number of Integrated Behavioral Health visits: 3 Session Start time: 2:15  Session End time: 2:48 Total time: 15  Referring Provider: Tinnie Gens, MD Type of Visit: Video Patient/Family location: [facility] Provider location: [facility]  All persons participating in visit: Patient Female Lori Mays and Lori Mays    Confirmed patient's address: Yes  Confirmed patient's phone number: Yes  Any changes to demographics: No   Confirmed patient's insurance: Yes  Any changes to patient's insurance: No   Discussed confidentiality: at previous visit  I connected with Lori Mays a by a video enabled telemedicine application (Caregility) and verified that I am speaking with the correct person using two identifiers.     I discussed the limitations of evaluation and management by telemedicine and the availability of in person appointments.  I discussed that the purpose of this visit is to provide behavioral health care while limiting exposure to the novel coronavirus.   Discussed there is a possibility of technology failure and discussed alternative modes of communication if that failure occurs.  I discussed that engaging in this virtual visit, they consent to the provision of behavioral healthcare and the services will be billed under their insurance.  Patient and/or legal guardian expressed understanding and consented to virtual visit: Yes   PRESENTING CONCERNS: Patient and/or family reports the following symptoms/concerns: Pt states her primary concern today is ongoing life stress; helps to talk through feelings today.  Duration of problem: Ongoing with increase in recent months; Severity of problem: moderate  STRENGTHS (Protective Factors/Coping Skills): Self-aware, self-adcocacy, hopeful outlook; implementing  self-coping strategies; working towards meeting life goals  GOALS ADDRESSED: Patient will: 1.  Reduce symptoms of: anxiety, depression, insomnia and stress  2.  Demonstrate ability to: Increase healthy adjustment to current life circumstances  INTERVENTIONS: Interventions utilized:  Solution-Focused Strategies Standardized Assessments completed: Not Needed  ASSESSMENT: Patient currently experiencing Adjustment disorder with anxiety and depression.   Patient may benefit from continued psychoeducation and brief therapeutic interventions regarding coping with symptoms of anxiety, depression, and life stress .  PLAN: 1. Follow up with behavioral health clinician on : As needed 2. Behavioral recommendations:  -Continue using problem-solving skills and self-coping strategies to work on meeting life goals (turn driving permit to full license, job interview to hired, and continue search for housing) -Consider caregiver support group activities,as discussed  3. Referral(s): Integrated [employer] (In Clinic)  I discussed the assessment and treatment plan with the patient and/or parent/guardian. They were provided an opportunity to ask questions and all were answered. They agreed with the plan and demonstrated an understanding of the instructions.   They were advised to call back or seek an in-person evaluation if the symptoms worsen or if the condition fails to improve as anticipated.  Valetta Close [facility]  Depression screen [facility] 2/9 05/24/2020 05/02/2020 09/09/2019 02/15/2019 01/05/2019  Decreased Interest 0 2 1 0 0  Down, Depressed, Hopeless 0 3 0 0 1  PHQ - 2 Score 0 5 1 0 1  Altered sleeping 1 3 3 1  -  Tired, decreased energy 1 3 2 1  -  Change in appetite 2 2 2  0 -  Feeling bad or failure about yourself  0 0 1 0 -  Trouble concentrating 0 2 0 0 -  Moving slowly or fidgety/restless 0 0 0 0 -  Suicidal thoughts 0 0 0  0 -  PHQ-9 Score 4 15 9 2  -   GAD 7 : Generalized Anxiety  Score 05/24/2020 05/02/2020 09/09/2019 02/15/2019  Nervous, Anxious, on Edge 0 3 0 0  Control/stop worrying 0 2 1 1   Worry too much - different things 0 1 2 1   Trouble relaxing 0 3 3 0  Restless 1 1 0 0  Easily annoyed or irritable 2 1 2 1   Afraid - awful might happen 1 1 1  0  Total GAD 7 Score 4 12 9 3   Anxiety Difficulty - - - -

## 2020-07-18 ENCOUNTER — Ambulatory Visit (INDEPENDENT_AMBULATORY_CARE_PROVIDER_SITE_OTHER): Payer: Medicaid Other | Admitting: Clinical

## 2020-07-18 DIAGNOSIS — F4323 Adjustment disorder with mixed anxiety and depressed mood: Secondary | ICD-10-CM | POA: Diagnosis not present

## 2020-07-23 ENCOUNTER — Encounter: Payer: Self-pay | Admitting: Family

## 2020-07-23 ENCOUNTER — Other Ambulatory Visit: Payer: Self-pay

## 2020-07-23 ENCOUNTER — Ambulatory Visit (HOSPITAL_COMMUNITY)
Admission: RE | Admit: 2020-07-23 | Discharge: 2020-07-23 | Disposition: A | Discharge status: Home / Self Care | Payer: Medicaid Other | Source: Ambulatory Visit | Attending: Family | Admitting: Family

## 2020-07-23 ENCOUNTER — Ambulatory Visit (HOSPITAL_BASED_OUTPATIENT_CLINIC_OR_DEPARTMENT_OTHER): Payer: Medicaid Other | Admitting: Family

## 2020-07-23 VITALS — BP 113/76 | HR 68 | Resp 16 | Wt 151.2 lb

## 2020-07-23 DIAGNOSIS — M79662 Pain in left lower leg: Secondary | ICD-10-CM

## 2020-07-23 NOTE — Progress Notes (Signed)
Please call patient with update.  No blood clot in the left leg.  No blockages seen in right leg.

## 2020-07-23 NOTE — Progress Notes (Signed)
Patient ID: Lori Mays, female    DOB: 1986/09/11  MRN: [id number]  CC: Left Leg Pain  Subjective: Lori Mays is a 34 y.o. 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   1. LEG PAIN: Onset: worsening since 2 months ago  aggresively sleeping on left side hurts, pain at previous clot site, joint pain, better with elevate improves, leg pillow, into the hip Location: left lower leg Radiation:  left knee, left hip, left foot Pain Rating: 4-7/10 Pain Duration: comes and goes  Characteristics: achy throbbing pain at left lateral knee, throbbing and irritating at left lower leg, tingling at the left hip, foot pain Aggravating Factors: standing for long periods can make it worse Relieving Factors: heating pads, topic solutions, elevation of leg, Tylenol, Advil Trauma:  left foot fracture during childhood  Ambulation difficulty: sometimes feel stiff when getting up after sitting for an extended period Swelling: left foot swelling especially the ankle Tenderness: left knee  Warmth: reports if inflamed then the foot will be warm Shortness of breath and chest pain: denies Has a similar problem happened before: yes, previous DVT in the same leg. Reports at that time she was placed on Xarelto and since then has quit taking it about 4 months ago.   Last visit 08/09/2019 with nurse practitioner Meredeth Ide. During that encounter patient with left leg pain and ultrasound of lower extremity ordered.    Patient Active Problem List   Diagnosis Date Noted  . Chronic constipation 05/09/2020  . History of DVT (deep vein thrombosis) 05/02/2020  . Dysplasia of cervix, 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  . Low grade squamous intraepith lesion on cytologic smear cervix (lgsil) 11/09/2019    . Asthma, chronic 06/22/2013  . Migraine, unspecified 06/22/2013  . Arthralgia 06/13/2013  . Endometriosis 06/13/2013  . Edema of both legs 06/13/2013  . Weight gain 06/13/2013     Current Outpatient Medications on File Prior to Visit  Medication Sig Dispense Refill  . acetaminophen (TYLENOL) 500 MG tablet Take 500 mg by mouth every 6 (six) hours as needed for moderate pain.    Marland Kitchen albuterol (VENTOLIN HFA) 108 (90 Base) MCG/ACT inhaler Inhale 1-2 puffs into the lungs every 6 (six) hours as needed for wheezing or shortness of breath. 18 g 1  . diclofenac (VOLTAREN) 75 MG EC tablet Take 1 tablet (75 mg total) by mouth 2 (two) times daily with a meal. 60 tablet 2  . Elagolix Sodium (ORILISSA) 200 MG TABS Take 1 tablet by mouth in the morning and at bedtime. 60 tablet 2  . ibuprofen (ADVIL) 200 MG tablet Take 200 mg by mouth every 6 (six) hours as needed for moderate pain.    Marland Kitchen linaclotide (LINZESS) 290 MCG CAPS capsule Take 1 capsule (290 mcg total) by mouth daily before breakfast. 30 capsule 2  . meloxicam (MOBIC) 7.5 MG tablet Take 1 tablet (7.5 mg total) by mouth daily. May increase to two tabs daily if needed 30 tablet 2  . ondansetron (ZOFRAN-ODT) 8 MG disintegrating tablet Take 8 mg by mouth every 8 (eight) hours as needed for nausea or vomiting.    Marland Kitchen oxyCODONE-acetaminophen (PERCOCET/ROXICET) 5-325 MG tablet Take 0.5-1 tablets by mouth every 6 (six) hours as needed for moderate pain or severe pain.    . polyethylene glycol  powder (GLYCOLAX/MIRALAX) 17 GM/SCOOP powder Take 17 g by mouth as directed. 3350 g 1  . Vitamin D, Ergocalciferol, (DRISDOL) 1.25 MG (50000 UT) CAPS capsule Take 1 capsule (50,000 Units total) by mouth every 7 (seven) days. 12 capsule 1   No current facility-administered medications on file prior to visit.    Allergies  Allergen Reactions  . Diphenhydramine Hcl     Asthma attacks  . Latex Itching    Social History   Socioeconomic History  . Marital status:  Single    Spouse name: Not on file  . Number of children: Not on file  . Years of education: Not on file  . Highest education level: Some college, no degree  Occupational History  . Not on file  Tobacco Use  . Smoking status: Never Smoker  . Smokeless tobacco: Never Used  Vaping Use  . Vaping Use: Never used  Substance and Sexual Activity  . Alcohol use: Not Currently    Alcohol/week: 1.0 - 2.0 standard drink    Types: 1 - 2 Glasses of wine per week    Comment: occassionally  . Drug use: No  . Sexual activity: Yes    Partners: Male    Birth control/protection: Condom  Other Topics Concern  . Not on file  Social History Narrative  . Not on file   Social Determinants of Health   Financial Resource Strain:   . Difficulty of Paying Living Expenses:   Food Insecurity: No Food Insecurity  . Worried About Programme researcher, broadcasting/film/video in the Last Year: Never true  . Ran Out of Food in the Last Year: Never true  Transportation Needs: No Transportation Needs  . Lack of Transportation (Medical): No  . Lack of Transportation (Non-Medical): No  Physical Activity:   . Days of Exercise per Week:   . Minutes of Exercise per Session:   Stress:   . Feeling of Stress :   Social Connections:   . Frequency of Communication with Friends and Family:   . Frequency of Social Gatherings with Friends and Family:   . Attends Religious Services:   . Active Member of Clubs or Organizations:   . Attends Banker Meetings:   Marland Kitchen Marital Status:   Intimate Partner Violence:   . Fear of Current or Ex-Partner:   . Emotionally Abused:   Marland Kitchen Physically Abused:   . Sexually Abused:     Family History  Problem Relation Age of Onset  . Breast cancer Maternal Grandmother   . Brain cancer Paternal Grandmother   . Breast cancer Paternal Grandmother   . Breast cancer Paternal Aunt     Past Surgical History:  Procedure Laterality Date  . laproscopy  2011    ROS: Review of Systems Negative  except as stated above  PHYSICAL EXAM: Vitals with BMI 07/23/2020 05/09/2020 05/02/2020  Height - 5\' 8"  -  Weight 151 lbs 3 oz 154 lbs 153 lbs 8 oz  BMI - 23.42 -  Systolic 113 104 -  Diastolic 76 74 -  Pulse 68 80 -  SpO2- 100%, room air  Wt Readings from Last 3 Encounters:  07/23/20 151 lb 3.2 oz (68.6 kg)  05/09/20 154 lb (69.9 kg)  05/02/20 153 lb 8 oz (69.6 kg)    Physical Exam General appearance - alert, well appearing, and in no distress and oriented to person, place, and time Mental status - alert, oriented to person, place, and time, normal mood, behavior, speech, dress, motor activity,  and thought processes Neck - supple, no significant adenopathy Lymphatics - no palpable lymphadenopathy, no hepatosplenomegaly Chest - clear to auscultation, no wheezes, rales or rhonchi, symmetric air entry, no tachypnea, retractions or cyanosis Heart - normal rate, regular rhythm, normal S1, S2, no murmurs, rubs, clicks or gallops Extremities - peripheral pulses normal; no redness or tenderness in the calves or thighs; tenderness at left knee;  left foot pedal edema 1+,  no clubbing or cyanosis Skin - normal coloration and turgor, no rashes, no suspicious skin lesions noted  ASSESSMENT AND PLAN: 1. Pain in left lower leg: -Patient with left lower leg pain which radiates to the left hip and left foot worsening since the last 2 months. Patient reports she does have history of deep venous thrombosis in the same leg. Reports she was taking Xarelto for this in the past and no longer taking. -Vascular ultrasound of left lower extremity for left lower leg pain. Will call patient with results. Patient verbalized understanding. - VAS [place] LOWER EXTREMITY VENOUS (DVT); Future   Patient was given the opportunity to ask questions.  Patient verbalized understanding of the plan and was able to repeat key elements of the plan. Patient was given clear instructions to go to Emergency Department or return to  medical center if symptoms don't improve, worsen, or new problems develop.The patient verbalized understanding.  Rema Fendt, NP

## 2020-07-23 NOTE — Patient Instructions (Signed)
Ultrasound of left lower extremity. Will call with results and instructions.  Deep Vein Thrombosis  Deep vein thrombosis (DVT) is a condition in which a blood clot forms in a deep vein, such as a lower leg, thigh, or arm vein. A clot is blood that has thickened into a gel or solid. This condition is dangerous. It can lead to serious and even life-threatening complications if the clot travels to the lungs and causes a blockage (pulmonary embolism). It can also damage veins in the leg. This can result in leg pain, swelling, discoloration, and sores (post-thrombotic syndrome). What are the causes? This condition may be caused by:  A slowdown of blood flow.  Damage to a vein.  A condition that causes blood to clot more easily, such as an inherited clotting disorder. What increases the risk? The following factors may make you more likely to develop this condition:  Being overweight.  Being older, especially over age 12.  Sitting or lying down for more than four hours.  Being in the hospital.  Lack of physical activity (sedentary lifestyle).  Pregnancy, being in childbirth, or having recently given birth.  Taking medicines that contain estrogen, such as medicines to prevent pregnancy.  Smoking.  A history of any of the following: ? Blood clots or a blood clotting disease. ? Peripheral vascular disease. ? Inflammatory bowel disease. ? Cancer. ? Heart disease. ? Genetic conditions that affect how your blood clots, such as Factor V Leiden mutation. ? Neurological diseases that affect your legs (leg paresis). ? A recent injury, such as a car accident. ? Major or lengthy surgery. ? A central line placed inside a large vein. What are the signs or symptoms? Symptoms of this condition include:  Swelling, pain, or tenderness in an arm or leg.  Warmth, redness, or discoloration in an arm or leg. If the clot is in your leg, symptoms may be more noticeable or worse when you stand or  walk. Some people may not develop any symptoms. How is this diagnosed? This condition is diagnosed with:  A medical history and physical exam.  Tests, such as: ? Blood tests. These are done to check how well your blood clots. ? Ultrasound. This is done to check for clots. ? Venogram. For this test, contrast dye is injected into a vein and X-rays are taken to check for any clots. How is this treated? Treatment for this condition depends on:  The cause of your DVT.  Your risk for bleeding or developing more clots.  Any other medical conditions that you have. Treatment may include:  Taking a blood thinner (anticoagulant). This type of medicine prevents clots from forming. It may be taken by mouth, injected under the skin, or injected through an IV (catheter).  Injecting clot-dissolving medicines into the affected vein (catheter-directed thrombolysis).  Having surgery. Surgery may be done to: ? Remove the clot. ? Place a filter in a large vein to catch blood clots before they reach the lungs. Some treatments may be continued for up to six months. Follow these instructions at home: If you are taking blood thinners:  Take the medicine exactly as told by your health care provider. Some blood thinners need to be taken at the same time every day. Do not skip a dose.  Talk with your health care provider before you take any medicines that contain aspirin or NSAIDs. These medicines increase your risk for dangerous bleeding.  Ask your health care provider about foods and drugs that could change  the way the medicine works (may interact). Avoid those things if your health care provider tells you to do so.  Blood thinners can cause easy bruising and may make it difficult to stop bleeding. Because of this: ? Be very careful when using knives, scissors, or other sharp objects. ? Use an electric razor instead of a blade. ? Avoid activities that could cause injury or bruising, and follow  instructions about how to prevent falls.  Wear a medical alert bracelet or carry a card that lists what medicines you take. General instructions  Take over-the-counter and prescription medicines only as told by your health care provider.  Return to your normal activities as told by your health care provider. Ask your health care provider what activities are safe for you.  Wear compression stockings if recommended by your health care provider.  Keep all follow-up visits as told by your health care provider. This is important. How is this prevented? To lower your risk of developing this condition again:  For 30 or more minutes every day, do an activity that: ? Involves moving your arms and legs. ? Increases your heart rate.  When traveling for longer than four hours: ? Exercise your arms and legs every hour. ? Drink plenty of water. ? Avoid drinking alcohol.  Avoid sitting or lying for a long time without moving your legs.  If you have surgery or you are hospitalized, ask about ways to prevent blood clots. These may include taking frequent walks or using anticoagulants.  Stay at a healthy weight.  If you are a woman who is older than age 55, avoid unnecessary use of medicines that contain estrogen, such as some birth control pills.  Do not use any products that contain nicotine or tobacco, such as cigarettes and e-cigarettes. This is especially important if you take estrogen medicines. If you need help quitting, ask your health care provider. Contact a health care provider if:  You miss a dose of your blood thinner.  Your menstrual period is heavier than usual.  You have unusual bruising. Get help right away if:  You have: ? New or increased pain, swelling, or redness in an arm or leg. ? Numbness or tingling in an arm or leg. ? Shortness of breath. ? Chest pain. ? A rapid or irregular heartbeat. ? A severe headache or confusion. ? A cut that will not stop  bleeding.  There is blood in your vomit, stool, or urine.  You have a serious fall or accident, or you hit your head.  You feel light-headed or dizzy.  You cough up blood. These symptoms may represent a serious problem that is an emergency. Do not wait to see if the symptoms will go away. Get medical help right away. Call your local emergency services (911 in the [place] Do not drive yourself to the hospital. Summary  Deep vein thrombosis (DVT) is a condition in which a blood clot forms in a deep vein, such as a lower leg, thigh, or arm vein.  Symptoms can include swelling, warmth, pain, and redness in your leg or arm.  This condition may be treated with a blood thinner (anticoagulant medicine), medicine that is injected to dissolve blood clots,compression stockings, or surgery.  If you are prescribed blood thinners, take them exactly as told. This information is not intended to replace advice given to you by your health care provider. Make sure you discuss any questions you have with your health care provider. Document Revised: 11/27/2017 Document Reviewed:  05/15/2017 Elsevier Patient Education  [place].

## 2020-07-24 ENCOUNTER — Telehealth: Payer: Self-pay

## 2020-07-24 NOTE — Telephone Encounter (Signed)
Contacted pt to go over US results pt is aware and doesn't have any questions or concerns  

## 2020-07-29 DIAGNOSIS — Z419 Encounter for procedure for purposes other than remedying health state, unspecified: Secondary | ICD-10-CM | POA: Diagnosis not present

## 2020-08-05 ENCOUNTER — Other Ambulatory Visit: Payer: Self-pay | Admitting: Family Medicine

## 2020-08-05 DIAGNOSIS — N809 Endometriosis, unspecified: Secondary | ICD-10-CM

## 2020-08-06 ENCOUNTER — Telehealth (INDEPENDENT_AMBULATORY_CARE_PROVIDER_SITE_OTHER): Payer: Medicaid Other | Admitting: *Deleted

## 2020-08-06 DIAGNOSIS — N809 Endometriosis, unspecified: Secondary | ICD-10-CM

## 2020-08-06 NOTE — Telephone Encounter (Signed)
Pt left message requesting refill of Orlissa birth control. She stated she has not had it for several days.

## 2020-08-08 ENCOUNTER — Telehealth: Payer: Self-pay | Admitting: Gastroenterology

## 2020-08-08 MED ORDER — LINACLOTIDE 290 MCG PO CAPS: 290.0000 ug | ORAL_CAPSULE | Freq: Every day | ORAL | 2 refills | Status: DC

## 2020-08-08 NOTE — Telephone Encounter (Signed)
Refill for Linzess faxed to [id number].

## 2020-08-08 NOTE — Telephone Encounter (Signed)
Pt needs rf for Linzess 290 mcg sent to [facility] on [address].

## 2020-08-09 NOTE — Telephone Encounter (Signed)
Called patient and she has picked up her prescription. She has no questions or concerns at this time.

## 2020-08-29 DIAGNOSIS — Z419 Encounter for procedure for purposes other than remedying health state, unspecified: Secondary | ICD-10-CM | POA: Diagnosis not present

## 2020-09-28 DIAGNOSIS — Z419 Encounter for procedure for purposes other than remedying health state, unspecified: Secondary | ICD-10-CM | POA: Diagnosis not present

## 2020-10-03 ENCOUNTER — Other Ambulatory Visit: Payer: Self-pay | Admitting: Nurse Practitioner

## 2020-10-03 DIAGNOSIS — J4520 Mild intermittent asthma, uncomplicated: Secondary | ICD-10-CM

## 2020-10-11 ENCOUNTER — Telehealth: Payer: Self-pay | Admitting: *Deleted

## 2020-10-11 NOTE — Telephone Encounter (Signed)
Received call from pt's sister Lori Mays. She stated that Lori Mays needs a refill of Lori Mays - she took her last pill yesterday. Lori Mays is @ work but can be reached @ [phone] to leave a message. I called pt's pharmacy and confirmed that she has one remaining refill which will be prepared for pt. I then called pt and informed her that the refill is being prepared and she can pick it up later today. Pt voiced understanding.

## 2020-10-29 DIAGNOSIS — Z419 Encounter for procedure for purposes other than remedying health state, unspecified: Secondary | ICD-10-CM | POA: Diagnosis not present

## 2020-11-19 ENCOUNTER — Encounter: Payer: Self-pay | Admitting: *Deleted

## 2020-11-19 ENCOUNTER — Telehealth: Payer: Self-pay | Admitting: *Deleted

## 2020-11-19 NOTE — Telephone Encounter (Signed)
Patient left a voice message today stating she is calling to get refill of Orlissa.  Tanisha Lutes,RN

## 2020-11-19 NOTE — Telephone Encounter (Signed)
Note sent to Dr. Shawnie Pons regarding pt's refill request. Mychart message was also sent to pt letting her know that she will receive a response from our staff or from Dr. Shawnie Pons directly.

## 2020-11-20 ENCOUNTER — Other Ambulatory Visit: Payer: Self-pay | Admitting: Family Medicine

## 2020-11-20 DIAGNOSIS — N809 Endometriosis, unspecified: Secondary | ICD-10-CM

## 2020-11-20 MED ORDER — ORILISSA 150 MG PO TABS: 1.0000 | ORAL_TABLET | Freq: Every morning | ORAL | 3 refills | Status: DC

## 2020-11-20 NOTE — Telephone Encounter (Signed)
Mychart message was sent to pt informing her of new medication dose as well as the rationale for the change. In the message, pt was asked if she has follow up appt scheduled @ [facility] or initial appt with minimally invasive surgeon.

## 2020-11-20 NOTE — Telephone Encounter (Signed)
She cannot be on the 200 bid for more than 6 months--will change to 150 daily, and can be on this up to 2 years. She did an initial visit with [facility], but then did not see the minimally invasive surgeon--can we confirm whether she has f/u with them?

## 2020-11-28 DIAGNOSIS — Z419 Encounter for procedure for purposes other than remedying health state, unspecified: Secondary | ICD-10-CM | POA: Diagnosis not present

## 2020-12-18 ENCOUNTER — Other Ambulatory Visit: Payer: Self-pay | Admitting: Gastroenterology

## 2020-12-29 DIAGNOSIS — Z419 Encounter for procedure for purposes other than remedying health state, unspecified: Secondary | ICD-10-CM | POA: Diagnosis not present

## 2021-01-17 DIAGNOSIS — U071 COVID-19: Secondary | ICD-10-CM | POA: Diagnosis not present

## 2021-01-21 ENCOUNTER — Ambulatory Visit: Payer: Medicaid Other

## 2021-01-24 DIAGNOSIS — Z8616 Personal history of COVID-19: Secondary | ICD-10-CM | POA: Diagnosis not present

## 2021-01-29 DIAGNOSIS — Z419 Encounter for procedure for purposes other than remedying health state, unspecified: Secondary | ICD-10-CM | POA: Diagnosis not present

## 2021-02-26 DIAGNOSIS — Z419 Encounter for procedure for purposes other than remedying health state, unspecified: Secondary | ICD-10-CM | POA: Diagnosis not present

## 2021-03-15 DIAGNOSIS — Z20822 Contact with and (suspected) exposure to covid-19: Secondary | ICD-10-CM | POA: Diagnosis not present

## 2021-03-29 DIAGNOSIS — Z419 Encounter for procedure for purposes other than remedying health state, unspecified: Secondary | ICD-10-CM | POA: Diagnosis not present

## 2021-04-01 ENCOUNTER — Other Ambulatory Visit: Payer: Self-pay

## 2021-04-01 ENCOUNTER — Other Ambulatory Visit: Payer: Self-pay | Admitting: *Deleted

## 2021-04-01 DIAGNOSIS — Z124 Encounter for screening for malignant neoplasm of cervix: Secondary | ICD-10-CM

## 2021-04-01 NOTE — Progress Notes (Signed)
Patient: Lori Mays           Date of Birth: 1986/08/03           MRN: [id number] Visit Date: 04/01/2021 PCP: Claiborne Rigg, NP     Cervical Exam  Abnormal Observations: Normal exam Recommendations: Last Pap smear was 10/24/2019 at the free cervical cancer screening and [facility]. Patient had a colposcopy completed 12/01/2019 for follow-up that showed CIN-II and a LEEP 01/10/2020 that was benign. Per patient has a history of an abnormal Pap smear in 2018 that a colposcopy was completed for follow-up. Let patient know that if today's Pap smear is normal and HPV negative that next Pap smear will be due in one year due to her history of abnormal Pap smears. Informed patient that will follow-up with her within the next couple of weeks with results of her Pap smear by letter or phone.      Patient's History Patient Active Problem List   Diagnosis Date Noted  . Chronic constipation 05/09/2020  . History of DVT (deep vein thrombosis) 05/02/2020  . Dysplasia of cervix, 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  . Low grade squamous intraepith lesion on cytologic smear cervix (lgsil) 11/09/2019  . Asthma, chronic 06/22/2013  . Migraine, unspecified 06/22/2013  . Arthralgia 06/13/2013  . Endometriosis 06/13/2013  . Edema of both legs 06/13/2013  . Weight gain 06/13/2013   Past Medical History:  Diagnosis Date  . ASCUS with positive high risk HPV 06/16/2013  . Asthma   . Cyst 06/07/2012  . DVT, lower extremity, proximal (HCC)   . Endometriosis     Family History  Problem Relation Age of Onset  . Breast cancer Maternal Grandmother   . Brain cancer Paternal Grandmother   . Breast cancer Paternal Grandmother   . Breast cancer Paternal Aunt     Social History   Occupational History  . Not on file  Tobacco Use  . Smoking status: Never Smoker  . Smokeless tobacco: Never Used  Vaping Use  . Vaping Use: Never used  Substance and Sexual Activity  . Alcohol use: Not Currently    Alcohol/week:  1.0 - 2.0 standard drink    Types: 1 - 2 Glasses of wine per week    Comment: occassionally  . Drug use: No  . Sexual activity: Yes    Partners: Male    Birth control/protection: Condom

## 2021-04-04 ENCOUNTER — Telehealth: Payer: Self-pay

## 2021-04-04 LAB — CYTOLOGY - PAP
Comment: NEGATIVE
Diagnosis: NEGATIVE
High risk HPV: NEGATIVE

## 2021-04-04 NOTE — Telephone Encounter (Signed)
Patient informed negative Pap/HPV results, next pap smear due in 1 year. Patient verbalized understanding.

## 2021-04-28 DIAGNOSIS — Z419 Encounter for procedure for purposes other than remedying health state, unspecified: Secondary | ICD-10-CM | POA: Diagnosis not present

## 2021-05-16 DIAGNOSIS — R350 Frequency of micturition: Secondary | ICD-10-CM | POA: Diagnosis not present

## 2021-05-16 DIAGNOSIS — N809 Endometriosis, unspecified: Secondary | ICD-10-CM | POA: Diagnosis not present

## 2021-05-16 DIAGNOSIS — R102 Pelvic and perineal pain: Secondary | ICD-10-CM | POA: Diagnosis not present

## 2021-05-16 DIAGNOSIS — M7918 Myalgia, other site: Secondary | ICD-10-CM | POA: Diagnosis not present

## 2021-05-29 DIAGNOSIS — Z419 Encounter for procedure for purposes other than remedying health state, unspecified: Secondary | ICD-10-CM | POA: Diagnosis not present

## 2021-05-29 IMAGING — DX DG ABDOMEN 2V
2 series · 2 of 2 positions shown · non-contrast
Comparison: None.

CLINICAL DATA: Constipation.

EXAM:
ABDOMEN - 2 VIEW

[abdomen erect]
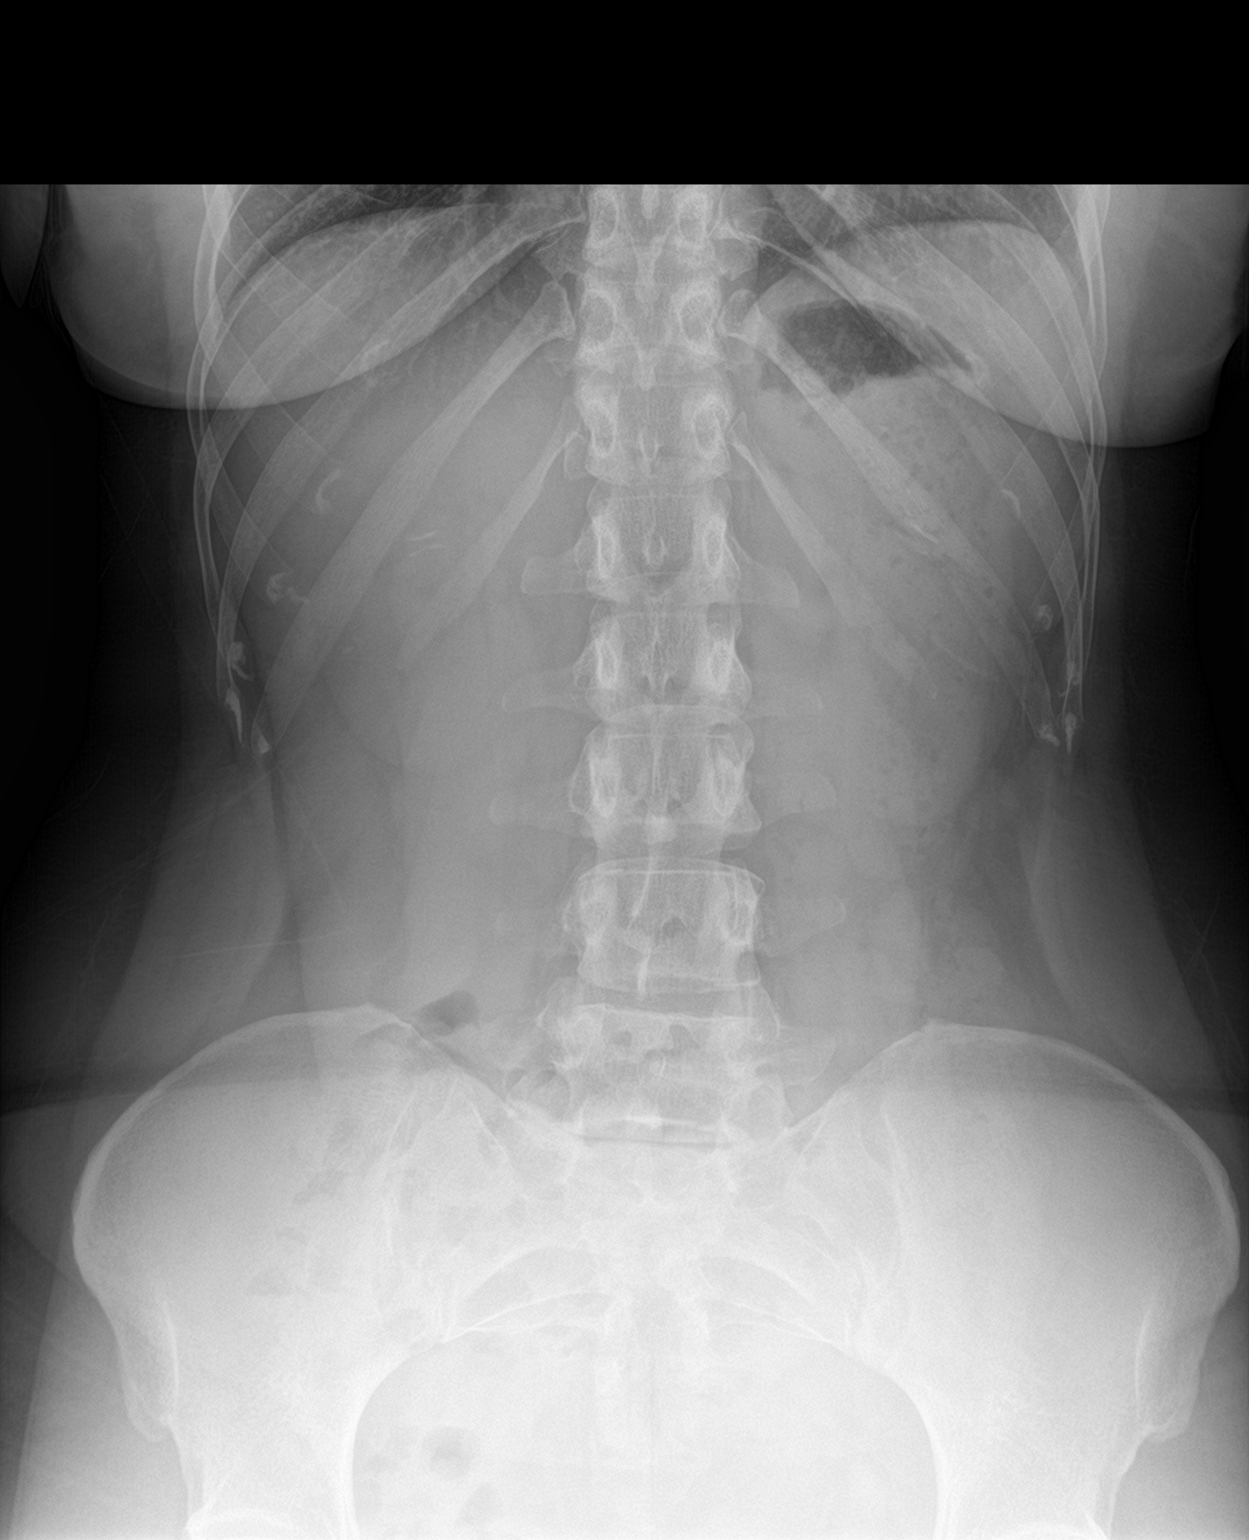

[abdomen supine]
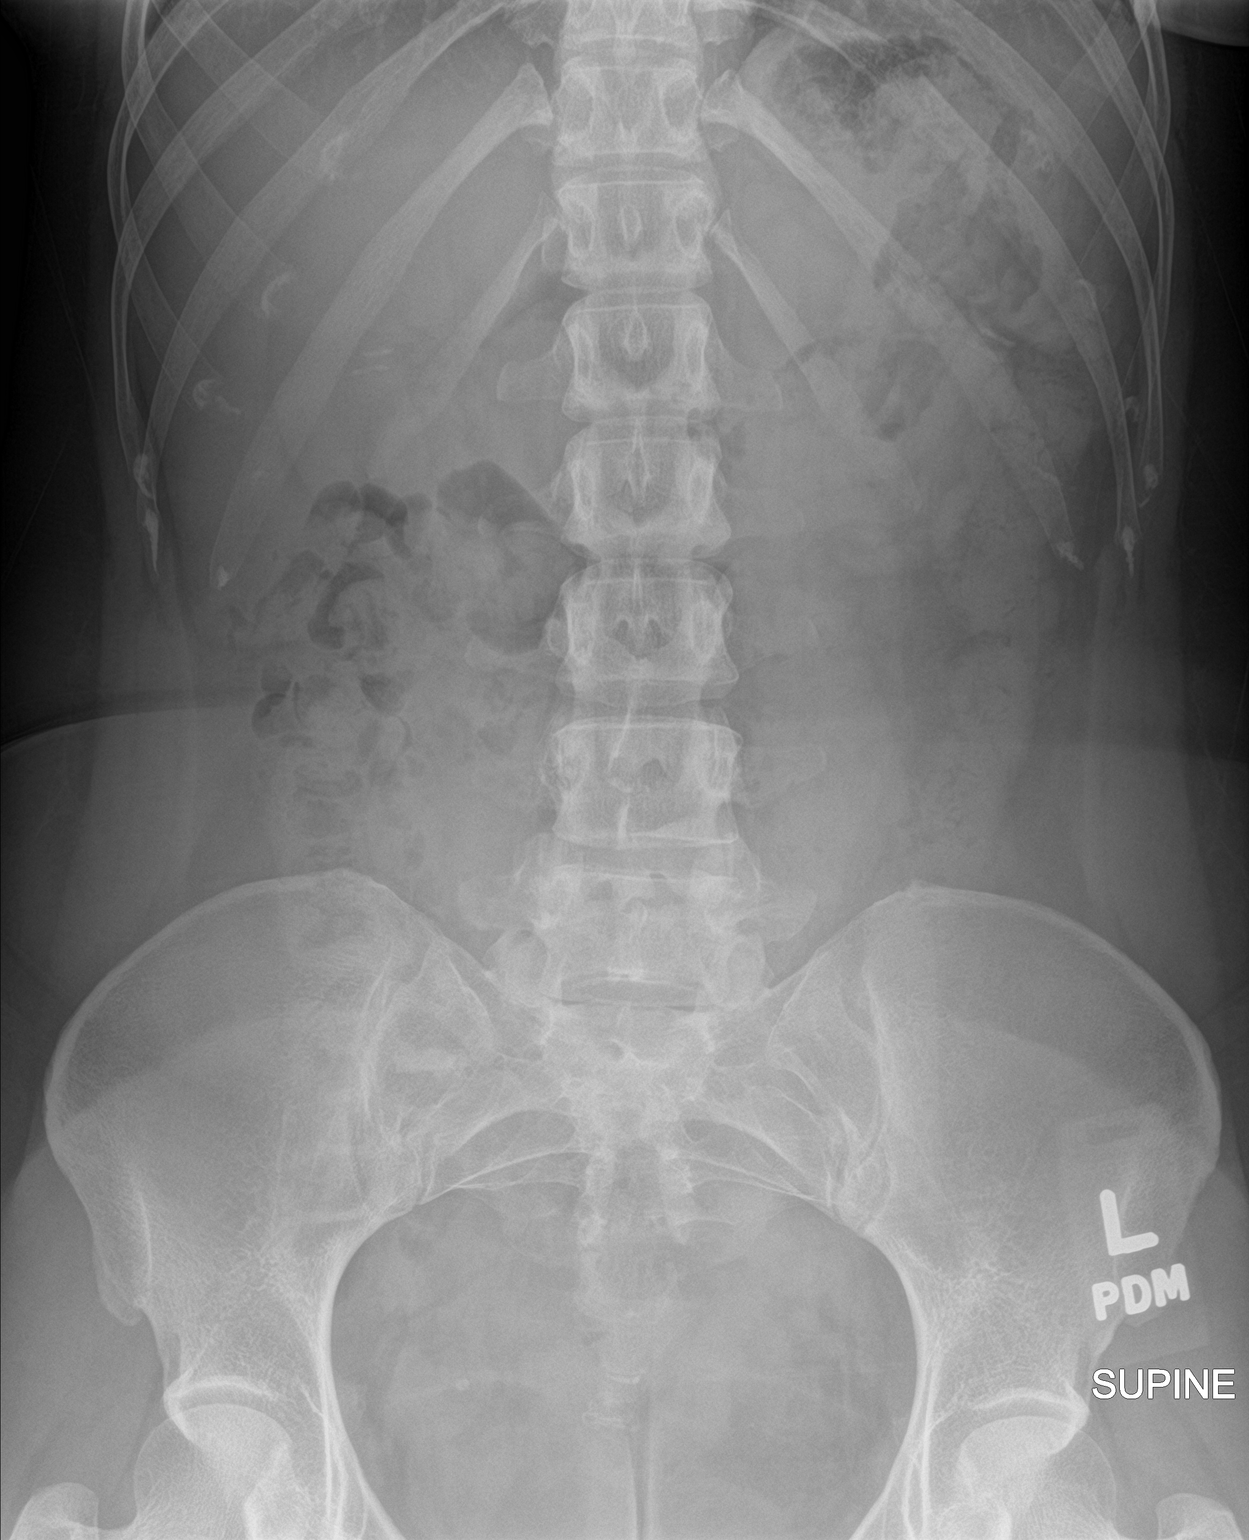

[2 of 2 positions shown; findings below may reference images not displayed]

FINDINGS: Nonobstructive bowel gas pattern. Mild colonic stool burden. There
is no evidence of free air. No radio-opaque calculi or other
significant radiographic abnormality is seen.
IMPRESSION: Mild colonic stool burden.  No bowel obstruction.

## 2021-06-20 DIAGNOSIS — R102 Pelvic and perineal pain: Secondary | ICD-10-CM | POA: Diagnosis not present

## 2021-06-20 DIAGNOSIS — G8929 Other chronic pain: Secondary | ICD-10-CM | POA: Diagnosis not present

## 2021-06-20 DIAGNOSIS — N809 Endometriosis, unspecified: Secondary | ICD-10-CM | POA: Diagnosis not present

## 2021-06-20 DIAGNOSIS — M7918 Myalgia, other site: Secondary | ICD-10-CM | POA: Diagnosis not present

## 2021-06-20 DIAGNOSIS — N946 Dysmenorrhea, unspecified: Secondary | ICD-10-CM | POA: Diagnosis not present

## 2021-06-20 DIAGNOSIS — G894 Chronic pain syndrome: Secondary | ICD-10-CM | POA: Diagnosis not present

## 2021-06-28 DIAGNOSIS — Z419 Encounter for procedure for purposes other than remedying health state, unspecified: Secondary | ICD-10-CM | POA: Diagnosis not present

## 2021-07-21 ENCOUNTER — Other Ambulatory Visit: Payer: Self-pay | Admitting: Gastroenterology

## 2021-07-21 ENCOUNTER — Other Ambulatory Visit: Payer: Self-pay | Admitting: Family Medicine

## 2021-07-21 DIAGNOSIS — R102 Pelvic and perineal pain: Secondary | ICD-10-CM

## 2021-07-21 DIAGNOSIS — N809 Endometriosis, unspecified: Secondary | ICD-10-CM

## 2021-08-07 ENCOUNTER — Other Ambulatory Visit: Payer: Self-pay

## 2021-08-07 ENCOUNTER — Ambulatory Visit: Payer: BC Managed Care – PPO | Attending: Student | Admitting: Physical Therapy

## 2021-08-07 ENCOUNTER — Encounter: Payer: Self-pay | Admitting: Physical Therapy

## 2021-08-07 DIAGNOSIS — R252 Cramp and spasm: Secondary | ICD-10-CM | POA: Diagnosis not present

## 2021-08-07 DIAGNOSIS — M6281 Muscle weakness (generalized): Secondary | ICD-10-CM | POA: Diagnosis not present

## 2021-08-07 DIAGNOSIS — R102 Pelvic and perineal pain: Secondary | ICD-10-CM | POA: Diagnosis not present

## 2021-08-07 NOTE — Therapy (Signed)
[facility] [address], [address] [place], [place], [address] Phone: [phone]   Fax:  [phone]  Physical Therapy Evaluation  Patient Details  Name: Lori Mays MRN: [id number] Date of Birth: 20-Jul-1986 Referring Provider (PT): Dr. Carmelina Noun   Encounter Date: 08/07/2021   PT End of Session - 08/07/21 1715     Visit Number 1    Date for PT Re-Evaluation 12/07/21    Authorization Type [employer]    PT Start Time 1630   came late   PT Stop Time 1710    PT Time Calculation (min) 40 min    Activity Tolerance Patient tolerated treatment well;Patient limited by pain    Behavior During Therapy [facility] for tasks assessed/performed             Past Medical History:  Diagnosis Date   ASCUS with positive high risk HPV 06/16/2013   Asthma    Cyst 06/07/2012   DVT, lower extremity, proximal (HCC)    Endometriosis     Past Surgical History:  Procedure Laterality Date   laproscopy  2011    There were no vitals filed for this visit.    Subjective Assessment - 08/07/21 1636     Subjective Patient reports her pain started when she was 12. Patient reports increasd pain in the pelvis.    Currently in Pain? 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 Carmelina Noun    Prior Therapy none      Precautions   Precautions None      Restrictions   Weight Bearing Restrictions No      Balance Screen   Has the  patient fallen in the past 6 months No    Has the patient had a decrease in activity level because of a fear of falling?  No    Is the patient reluctant to leave their home because of a fear of falling?  No      Home Tourist information centre manager residence      Prior Function   Level of Independence Independent    Vocation Full time employment    Vocation Requirements sitting at computer    Leisure no exercise      Cognition   Overall Cognitive Status Within Functional Limits for tasks assessed      Observation/Other Assessments   Focus on Therapeutic Outcomes (FOTO)  43      Posture/Postural Control   Posture/Postural Control No significant limitations      ROM / Strength   AROM / PROM / Strength AROM;PROM;Strength      AROM   Overall AROM Comments lumbar ROM is full      Strength   Right Hip Flexion 4/5    Right Hip Extension 3+/5  Right Hip External Rotation  4/5    Right Hip Internal Rotation 4/5    Right Hip ABduction 4/5    Right Hip ADduction 4/5    Left Hip Flexion 4/5    Left Hip Extension 3+/5    Left Hip External Rotation 4/5    Left Hip Internal Rotation 4/5    Left Hip ABduction 3+/5    Left Hip ADduction 3+/5      Palpation   SI assessment  left ilium rotated posteriorly    Palpation comment tenderness located throughout the abdomen, bil. buttocks, lumbar, soreness in the hamstirn and hip adductors;      Special Tests    Special Tests Sacrolliac Tests    Sacroiliac Tests  Pelvic Compression      Pelvic Compression   Findings Positive    Side Left    comment pain                        Objective measurements completed on examination: See above findings.     Pelvic Floor Special Questions - 08/07/21 0001     Prior Pregnancies No    Currently Sexually Active Yes    Is this Painful Yes    Urinary Leakage Yes    Activities that cause leaking Other    Other activities that cause leaking after urinating will have to  urinate again and has to push to urinate at the end    Urinary frequency urinate every 3-4 hours    Fecal incontinence Yes   severe constipation, on linzess, strain   Caffeine beverages drinks 1 cup per day    Skin Integrity Intact;Other    Skin Integrity other dryness    External Palpation q-tip test from 9:00 to 3:00 superiorly;    Pelvic Floor Internal Exam Patient confirms identification and approve PT to assess pelvic floor and treatment    Exam Type Vaginal    Palpation placed Q-tip into the vaginal canal and patient had pain level 4/10 therefore did not go further with internal assessment    Strength --   not assess due to pain                     PT Education - 08/07/21 1710     Education Details education on ph of vagina; information on you tube for pelvic floor meditation, stretches, and abdominal massage; educated patient with using coconut oil in the vulvar area, around the labia minora and majora    Person(s) Educated Patient    Methods Explanation;Handout    Comprehension Verbalized understanding              PT Short Term Goals - 08/07/21 1938       PT SHORT TERM GOAL #1   Title independent with initial HEP for meditation, stretches, and diaphragmatic breathing to relax the pelvic floor    Baseline not educated yet    Time 4    Period Weeks    Status New    Target Date 09/04/21      PT SHORT TERM GOAL #2   Title understand how to perform manual abdominal massage to assist in peristalic motion of the intestines for easier bowel movements    Baseline not educated yet    Time 4    Period Weeks    Status New    Target Date 09/04/21      PT SHORT TERM GOAL #3   Title  understand different ways to manage pain with down regulating her nervous system    Baseline not educated yet    Time 4    Period Weeks    Status New    Target Date 09/04/21               PT Long Term Goals - 08/07/21 1940       PT LONG TERM GOAL #1   Title  independent with advanced HEP with pain management, stretches, gentle strengthening and self massage techniques    Baseline not educated yet    Time 4    Period Months    Status New    Target Date 12/07/21      PT LONG TERM GOAL #2   Title able to allow the therapisr perform internal manual therapy and also have patient perform on herself to reduce trigger points of the pelvic floor to manage her pain    Baseline Q-tip causes level 4/10 pain vaginally, therapist not able to place her finger into the vaginal canal at this time    Time 4    Period Months    Status New    Target Date 12/07/21      PT LONG TERM GOAL #3   Title Patient able to toilet correctly and reduce straining by 50% due to elongation of the pelvic floor muscles    Baseline has to strain to have a bowel movement    Time 4    Period Months    Status New    Target Date 12/07/21      PT LONG TERM GOAL #4   Title [facility] </= 4/10 50% of the day due to reduction of trigger points    Baseline pain level 9/10    Time 4    Period Months    Status New    Target Date 12/07/21      PT LONG TERM GOAL #5   Title ability to urinate without double voiding due to relaxation of the pelvic floor muscles during urination    Baseline has to void 2 times and bear down to fully empty her bladder    Time 4    Period Months    Status New    Target Date 12/07/21                    Plan - 08/07/21 1716     Clinical Impression Statement Patient is a 35 year old female with pelvic pain since she was 35 year old. She had laproscopic surgery to remove endometriosis in 2014. Her most recent ultrasound found Kissing ovaries and small endometriomes in the ovaries. Patient has full lumbar ROM. She has weakness in her hips. Left ilium is rotated posteriorly Positive SI compression test on the left. Tenderness located in the abdomen, lumbar, gluteals, levator ani and obturator internist  externally. She has dryness vaginally and was instructed to use coconut oil on the labia and vulvar area. Q-tip test in the upper half of the vulvar area. She was able to tolerate the Q-tip placed into the vaginal canal at pain level 4/10,  therefore did not go further with internal assessment.    Personal Factors and Comorbidities Fitness;Comorbidity 1;Sex;Past/Current Experience;Time since onset of injury/illness/exacerbation    Comorbidities endometriosis    Examination-Activity Limitations Locomotion Level;Lift;Stand;Stairs;Sit    Examination-Participation Restrictions Cleaning;Meal Prep;Occupation;Community Activity;Driving;Interpersonal Relationship;Laundry    Stability/Clinical Decision Making Evolving/Moderate complexity    Clinical Decision  Making Low    Rehab Potential Good    PT Frequency 1x / week    PT Duration Other (comment)   4 months   PT Treatment/Interventions ADLs/Self Care Home Management;Biofeedback;Cryotherapy;Moist Heat;Electrical Stimulation;Therapeutic activities;Therapeutic exercise;Patient/family education;Manual techniques;Dry needling;Spinal Manipulations    PT Next Visit Plan hip stretches, diaphragmatic breathing; manual work on the abdomen, inner thigh, hamstring; fascial work around the abdomen; gentle work work around the Marketing executive with Plan of Care Patient             Patient will benefit from skilled therapeutic intervention in order to improve the following deficits and impairments:  Decreased coordination, Increased fascial restricitons, Impaired tone, Decreased endurance, Increased muscle spasms, Decreased activity tolerance, Pain, Decreased strength  Visit Diagnosis: Muscle weakness (generalized) - Plan: PT plan of care cert/re-cert  Cramp and spasm - Plan: PT plan of care cert/re-cert  Pelvic pain - Plan: PT plan of care cert/re-cert     Problem List Patient Active Problem List   Diagnosis Date Noted   Chronic  constipation 05/09/2020   History of DVT (deep vein thrombosis) 05/02/2020   Dysplasia of cervix, 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   Low grade squamous intraepith lesion on cytologic smear cervix (lgsil) 11/09/2019   Asthma, chronic 06/22/2013   Migraine, unspecified 06/22/2013   Arthralgia 06/13/2013   Endometriosis 06/13/2013   Edema of both legs 06/13/2013   Weight gain 06/13/2013    Eulis Foster, PT 08/07/21 7:51 PM  [facility] [address], [address] [place], [place], [address] Phone: [phone]   Fax:  [phone]  Name: Lori Mays MRN: [id number] Date of Birth: June 03, 1986

## 2021-08-07 NOTE — Patient Instructions (Addendum)
To perform pH testing, place a pH strip into the distal vagina. Let it sit there for a few seconds to absorb vaginal moisture. Remove and record results. Any concerning changes in vaginal pH can be documented and reported back to your patient's medical provider.  Here's a quick tour through the research on vaginal pH. I hope you enjoy learning and consider adding vaginal pH testing to your clinical practice.  -Normal vaginal pH is considered to range between 4.0 and 5.0 in menstruating women (Garcia-Closas)  -Intervaginal pH does not vary between vaginal location (proximal, mid, distal)  -Clinician test vs self-test results vary slightly but not significantly Wyline Mood)  -Low serum estradiol levels are associated with high vaginal pH values  -Vaginal pH of 4.5 is indicative of premenopausal estradiol levels and the absence of bacterial overgrowth  -Vaginal pH of 5.0 to 6.5 may indicate either estrogen changes or bacterial overgrowth, a culture may be warranted  -Vaginal pH of 6.0 to 7.5 is strongly suggestive of menopause  -Vaginal pH measurements can be helpful in determining the efficacy of treatment with vaginal estrogen (Caillouette)  -Vaginal pH levels (high) were as sensitive as blood serum FSH levels in the detection of menopause status Carloyn Manner) and Oncologist)  -Understanding of the vaginal microbiome and its effect on vaginal pH continues to evolve  -"Although lactic acid (produced by Lactobacillus) is the primary acid in normal vaginal secretions, other organic acids such as acetic, mydriatic, and linoleic acid are also normally found in vaginal fluid.  Some healthy women actually lack vaginal Lactobacilli, but their vaginal pH is in the normal (moderately acidic) range, and other lactic acid-producing bacteria, such as Atopobium, Megasphaera, and/or Leptotrichia species, are present."  -Lactic acid is also a byproduct of anaerobic glucose metabolism within the cells of the vaginal  mucosa  -The summation of vaginal pH is determined by both the vaginal mucosal metabolism (which is influenced by estrogen) and the vaginal microbiome, both of which are unique to each female  -Vaginal pH is around 5 in newborns, before colonization from microbes, rises to neutral range by 6 weeks of life, and falls when puberty ushers in an increase in estrogen  -Normal vaginal pH helps to keep optimal inflammatory responses and skin barrier functions in vaginal and vulvar skin.  Elevated pH in menopause leads to susceptibility to vulvar contact dermatitis  -Topical application to the menopausal vagina restores surface pH and lowers infection risk  -Candida infections do not alter vaginal pH  -STI via chlamydia, gonorrhea, or trichomonas as well as bacterial vaginosis may cause elevations in vaginal pH (Linhares)  - "Detection of HPV was positively associated with vaginal pH, mainly in women < 35 years. Elevated vaginal pH was associated with 30% greater risk of infection with multiple HPV types and with LSIL (low-grade intraepithelial lesions), predominantly in women younger than 22 and 52+ years of age. Detection of C. trachomatis DNA was associated with increased vaginal pH in women < 25 years" (Clarke)  -pH self-testing during pregnancy and treatment of underlying infections positively impacted preterm birth rates [facility] and [place])  -The microbiome in humans is generally >70% Lactobacilli, where other mammals have 1% or less  -Average pH of 21 non-human mammals is 5.4 to 7.8  -Authors propose that the high level of starch in human diets contributes to increased levels of vaginal glycogen which in turn proliferates lactobacilli.continued research will contribute to our collective knowledge base Sabra Heck)  -Vaginal microbiome variability may have an effect on fertility Erlinda Hong)  -  Douching with an over-the-counter lactic acid containing douche: did not significantly affect vaginal pH or  microbiome composition and may promote candida infections Zenaida Niece der Tory Emerald)   [facility] [address] [place], [place] [address] Phone # [phone] Fax [phone]

## 2021-08-15 ENCOUNTER — Ambulatory Visit: Payer: BC Managed Care – PPO | Admitting: Physical Therapy

## 2021-08-15 ENCOUNTER — Encounter: Payer: Self-pay | Admitting: Physical Therapy

## 2021-08-15 ENCOUNTER — Other Ambulatory Visit: Payer: Self-pay

## 2021-08-15 DIAGNOSIS — R252 Cramp and spasm: Secondary | ICD-10-CM

## 2021-08-15 DIAGNOSIS — M6281 Muscle weakness (generalized): Secondary | ICD-10-CM | POA: Diagnosis not present

## 2021-08-15 DIAGNOSIS — R102 Pelvic and perineal pain: Secondary | ICD-10-CM | POA: Diagnosis not present

## 2021-08-15 NOTE — Therapy (Signed)
[facility] [address] [place], [place], [address] Phone: [phone]   Fax:  [phone]  Physical Therapy Treatment  Patient Details  Name: Lori Mays MRN: [id number] Date of Birth: [birth year] Referring Provider (PT): Dr. Maceo Pro   Encounter Date: 08/15/2021   PT End of Session - 08/15/21 1243     Visit Number 2    Date for PT Re-Evaluation 12/07/21    Authorization Type [employer]    Authorization Time Period 8/10-10/9    Authorization - Visit Number 1    Authorization - Number of Visits 4    PT Start Time 1238   came late   PT Stop Time 1310    PT Time Calculation (min) 32 min    Activity Tolerance Patient tolerated treatment well;Patient limited by pain    Behavior During Therapy [facility] for tasks assessed/performed             Past Medical History:  Diagnosis Date   ASCUS with positive high risk HPV 06/16/2013   Asthma    Cyst 06/07/2012   DVT, lower extremity, proximal ([place])    Endometriosis     Past Surgical History:  Procedure Laterality Date   laproscopy  2011    There were no vitals filed for this visit.   Subjective Assessment - 08/15/21 1239     Subjective Today is not the best day. I have had pain on the right side.    Patient Stated Goals reduce pain    Currently in Pain? Yes    Pain Score 4     Pain Location Abdomen    Pain Orientation Lower    Pain Descriptors / Indicators Sharp;Burning    Pain Type Chronic pain    Pain Onset More than a month ago    Pain Frequency Intermittent    Aggravating Factors  too much activity, lifting something heavy, standing    Pain Relieving Factors tylenol or topic solution, reduce activity, heat                               [facility] Adult PT Treatment/Exercise - 08/15/21 0001       Lumbar Exercises: Stretches   Active Hamstring Stretch Right;Left;1 rep;30 seconds    Active Hamstring Stretch Limitations  sitting    Hip Flexor Stretch Right;Left;1 rep;30 seconds    Hip Flexor Stretch Limitations lift the arm overhead on the side stretching    Piriformis Stretch Right;Left;1 rep;30 seconds    Piriformis Stretch Limitations sitting    Other Lumbar Stretch Exercise hip adduction holding for 30 sec , right, left    Other Lumbar Stretch Exercise standing lumbar extension      Lumbar Exercises: Supine   Other Supine Lumbar Exercises diaphragmatic breathing with towel roll under the thoracic lumbar junction to prevent hinging      Manual Therapy   Manual Therapy Myofascial release    Myofascial Release fascial release of the urogenital diaphgram with one hand on the abdomen and other on the sacral area                    PT Education - 08/15/21 1314     Education Details Access Code: [email or web address]    Person(s) Educated Patient    Methods Explanation;Demonstration;Verbal cues;Handout    Comprehension Returned demonstration;Verbalized understanding              PT Short  Term Goals - 08/07/21 1938       PT SHORT TERM GOAL #1   Title independent with initial HEP for meditation, stretches, and diaphragmatic breathing to relax the pelvic floor    Baseline not educated yet    Time 4    Period Weeks    Status New    Target Date 09/04/21      PT SHORT TERM GOAL #2   Title understand how to perform manual abdominal massage to assist in peristalic motion of the intestines for easier bowel movements    Baseline not educated yet    Time 4    Period Weeks    Status New    Target Date 09/04/21      PT SHORT TERM GOAL #3   Title understand different ways to manage pain with down regulating her nervous system    Baseline not educated yet    Time 4    Period Weeks    Status New    Target Date 09/04/21               PT Long Term Goals - 08/07/21 1940       PT LONG TERM GOAL #1   Title independent with advanced HEP with pain management, stretches, gentle strengthening and  self massage techniques    Baseline not educated yet    Time 4    Period Months    Status New    Target Date 12/07/21      PT LONG TERM GOAL #2   Title able to allow the therapisr perform internal manual therapy and also have patient perform on herself to reduce trigger points of the pelvic floor to manage her pain    Baseline Q-tip causes level 4/10 pain vaginally, therapist not able to place her finger into the vaginal canal at this time    Time 4    Period Months    Status New    Target Date 12/07/21      PT LONG TERM GOAL #3   Title Patient able to toilet correctly and reduce straining by 50% due to elongation of the pelvic floor muscles    Baseline has to strain to have a bowel movement    Time 4    Period Months    Status New    Target Date 12/07/21      PT LONG TERM GOAL #4   Title pelvic and abdominal pain at [facility] level </= 4/10 50% of the day due to reduction of trigger points    Baseline pain level 9/10    Time 4    Period Months    Status New    Target Date 12/07/21      PT LONG TERM GOAL #5   Title ability to urinate without double voiding due to relaxation of the pelvic floor muscles during urination    Baseline has to void 2 times and bear down to fully empty her bladder    Time 4    Period Months    Status New    Target Date 12/07/21                   Plan - 08/15/21 1317     Clinical Impression Statement Patient had increased pain today but after the manual work her pain decreased to 2/10. She had fascial tightness in the lower abdominal area. She has learned diaphragmatic breathing and able to feel the pelvic floor relax some. Patient has just  started so has not met goals yet. Patient will benefit from skilled therapy to improve mobility, reduce pain and improve quality of life.    Personal Factors and Comorbidities Fitness;Comorbidity 1;Sex;Past/Current Experience;Time since onset of injury/illness/exacerbation    Comorbidities  endometriosis    Examination-Activity Limitations Locomotion Level;Lift;Stand;Stairs;Sit    Examination-Participation Restrictions Cleaning;Meal Prep;Occupation;Community Activity;Driving;Interpersonal Relationship;Laundry    Stability/Clinical Decision Making Evolving/Moderate complexity    Rehab Potential Good    PT Frequency 1x / week    PT Duration Other (comment)   4 months   PT Treatment/Interventions ADLs/Self Care Home Management;Biofeedback;Cryotherapy;Moist Heat;Electrical Stimulation;Therapeutic activities;Therapeutic exercise;Patient/family education;Manual techniques;Dry needling;Spinal Manipulations    PT Next Visit Plan fascial work to the respiratory and urogenital diaphragm, fascial work around the thighs    PT Home Exercise Plan Access Code: [email or web address]    Consulted and Agree with Plan of Care Patient             Patient will benefit from skilled therapeutic intervention in order to improve the following deficits and impairments:  Decreased coordination, Increased fascial restricitons, Impaired tone, Decreased endurance, Increased muscle spasms, Decreased activity tolerance, Pain, Decreased strength  Visit Diagnosis: Muscle weakness (generalized)  Cramp and spasm  Pelvic pain     Problem List Patient Active Problem List   Diagnosis Date Noted   Chronic constipation 05/09/2020   History of DVT (deep vein thrombosis) 05/02/2020   Dysplasia of cervix, 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   Low grade squamous intraepith lesion on cytologic smear cervix (lgsil) 11/09/2019   Asthma, chronic 06/22/2013   Migraine, unspecified 06/22/2013   Arthralgia 06/13/2013   Endometriosis 06/13/2013   Edema of both legs 06/13/2013   Weight gain 06/13/2013    Earlie Counts, PT 08/15/21 1:23 PM  [facility] [address] [place], [place], [address] Phone: [phone]   Fax:  [phone]  Name: Lori Mays MRN:  [id number] Date of Birth: [birth year]

## 2021-08-15 NOTE — Patient Instructions (Signed)
Access Code: [email or web address] URL: [email or web address] Date: 08/15/2021 Prepared by: Eulis Foster  Exercises Seated Hamstring Stretch - 1 x daily - 7 x weekly - 1 sets - 2 reps - 30 sec hold Seated Piriformis Stretch with Trunk Bend - 1 x daily - 7 x weekly - 1 sets - 2 reps - 30 sec hold Seated Hip Adductor Stretch - 1 x daily - 7 x weekly - 1 sets - 2 reps - 30 sec hold Half Kneeling Hip Flexor Stretch with Sidebend - 1 x daily - 7 x weekly - 1 sets - 2 reps - 30 sec hold Standing Lumbar Extension - 1 x daily - 7 x weekly - 1 sets - 5 reps Supine Diaphragmatic Breathing - 3 x daily - 7 x weekly - 10 reps  [facility] [address] [place], [place] [address] Phone # [phone] Fax [phone]

## 2021-09-13 ENCOUNTER — Other Ambulatory Visit (HOSPITAL_COMMUNITY)
Admission: RE | Admit: 2021-09-13 | Discharge: 2021-09-13 | Disposition: A | Discharge status: Home / Self Care | Payer: BC Managed Care – PPO | Source: Ambulatory Visit | Attending: Family Medicine | Admitting: Family Medicine

## 2021-09-13 ENCOUNTER — Ambulatory Visit (INDEPENDENT_AMBULATORY_CARE_PROVIDER_SITE_OTHER): Payer: BC Managed Care – PPO

## 2021-09-13 ENCOUNTER — Other Ambulatory Visit: Payer: Self-pay

## 2021-09-13 VITALS — BP 115/72 | HR 67 | Ht 69.0 in | Wt 166.1 lb

## 2021-09-13 DIAGNOSIS — N898 Other specified noninflammatory disorders of vagina: Secondary | ICD-10-CM | POA: Diagnosis not present

## 2021-09-13 NOTE — Progress Notes (Signed)
Pt here today for vaginal discharge with odor x 1 week. Pt denies itching and irritation and all other urinary/ vaginal symptoms.  Pt has hx of endometriosis and is seeing specialist at [facility].  Pt is taking Orlissa Rx and only missed 2 days, but is wanting ot know if that threw off pH balance. Pt denies being SA.  Pt advised to have self swab today. Self swab collected today. Pt advised results will take 24-48 hours and will see results in mychart and will be notified if needs further treatment. Pt verbalized understanding.   Judeth Cornfield, RN

## 2021-09-16 ENCOUNTER — Other Ambulatory Visit: Payer: Self-pay | Admitting: Obstetrics and Gynecology

## 2021-09-16 DIAGNOSIS — B9689 Other specified bacterial agents as the cause of diseases classified elsewhere: Secondary | ICD-10-CM

## 2021-09-16 DIAGNOSIS — N760 Acute vaginitis: Secondary | ICD-10-CM

## 2021-09-16 LAB — CERVICOVAGINAL ANCILLARY ONLY
Bacterial Vaginitis (gardnerella): POSITIVE — AB
Candida Glabrata: NEGATIVE
Candida Vaginitis: NEGATIVE
Chlamydia: NEGATIVE
Comment: NEGATIVE
Comment: NEGATIVE
Comment: NEGATIVE
Comment: NEGATIVE
Comment: NEGATIVE
Comment: NORMAL
Neisseria Gonorrhea: NEGATIVE
Trichomonas: NEGATIVE

## 2021-09-16 MED ORDER — METRONIDAZOLE 500 MG PO TABS: 500.0000 mg | ORAL_TABLET | Freq: Two times a day (BID) | ORAL | 0 refills | Status: AC

## 2021-10-23 ENCOUNTER — Ambulatory Visit: Payer: BC Managed Care – PPO | Attending: Student | Admitting: Physical Therapy

## 2021-10-23 ENCOUNTER — Encounter: Payer: Self-pay | Admitting: Physical Therapy

## 2021-10-23 ENCOUNTER — Other Ambulatory Visit: Payer: Self-pay

## 2021-10-23 DIAGNOSIS — R252 Cramp and spasm: Secondary | ICD-10-CM | POA: Insufficient documentation

## 2021-10-23 DIAGNOSIS — M6281 Muscle weakness (generalized): Secondary | ICD-10-CM | POA: Insufficient documentation

## 2021-10-23 DIAGNOSIS — R102 Pelvic and perineal pain: Secondary | ICD-10-CM | POA: Insufficient documentation

## 2021-10-23 NOTE — Therapy (Addendum)
[facility] [address] [place], [place], [address] Phone: [phone]   Fax:  [phone]  Physical Therapy Treatment  Patient Details  Name: Lori Mays MRN: [id number] Date of Birth: [birth year] Referring Provider (PT): Dr. Carmelina Noun   Encounter Date: 10/23/2021   PT End of Session - 10/23/21 1621     Visit Number 3    Date for PT Re-Evaluation 12/07/21    Authorization Type [employer]    Authorization Time Period 8/10-10/9    Authorization - Visit Number 2    Authorization - Number of Visits 4    PT Start Time 1615    PT Stop Time 1653    PT Time Calculation (min) 38 min    Activity Tolerance Patient tolerated treatment well;Patient limited by pain    Behavior During Therapy [facility] for tasks assessed/performed             Past Medical History:  Diagnosis Date   ASCUS with positive high risk HPV 06/16/2013   Asthma    Cyst 06/07/2012   DVT, lower extremity, proximal (HCC)    Endometriosis     Past Surgical History:  Procedure Laterality Date   laproscopy  2011    There were no vitals filed for this visit.   Subjective Assessment - 10/23/21 1619     Subjective I have not been to bad. I get sharpness around the area of the cyst. I get some inflammation in the lower pelvic area.    Patient Stated Goals reduce pain    Currently in Pain? Yes    Pain Score 1     Pain Location Abdomen    Pain Orientation Lower    Pain Descriptors / Indicators Sore    Pain Type Chronic pain    Pain Onset More than a month ago    Pain Frequency Intermittent    Aggravating Factors  too much activity, lifting something heavy, going from sit to stand quickly    Pain Relieving Factors tylenol or topic solutions, reduce activity, heat    Multiple Pain Sites No                [facility] PT Assessment - 10/23/21 0001       Assessment   Medical Diagnosis M79.18 Myalgia of pelvic floor    Referring Provider (PT)  Dr. Carmelina Noun    Prior Therapy none      Precautions   Precautions None      Restrictions   Weight Bearing Restrictions No      Home Environment   Living Environment Private residence      Prior Function   Level of Independence Independent    Vocation Full time employment    Vocation Requirements sitting at computer    Leisure no exercise      Cognition   Overall Cognitive Status Within Functional Limits for tasks assessed      Observation/Other Assessments   Focus on Therapeutic Outcomes (FOTO)  43      AROM   Overall AROM Comments lumbar ROM is full      Strength   Right Hip Flexion 4/5    Right Hip Extension 3+/5    Right Hip External Rotation  4/5    Right Hip Internal Rotation 4/5    Right Hip ABduction 4/5    Right Hip ADduction 4/5    Left Hip Flexion 4/5    Left Hip Extension 3+/5    Left  Hip External Rotation 4/5    Left Hip Internal Rotation 4/5    Left Hip ABduction 3+/5    Left Hip ADduction 3+/5      Palpation   SI assessment  left ilium rotated posteriorly    Palpation comment tenderness located throughout the abdomen, bil. buttocks, lumbar, soreness in the hamstirn and hip adductors;      Special Tests    Special Tests Sacrolliac Tests    Sacroiliac Tests  Pelvic Compression      Pelvic Compression   Findings Positive    Side Left    comment pain                        Pelvic Floor Special Questions - 10/23/21 0001     Currently Sexually Active Yes    Is this Painful Yes    Urinary Leakage Yes    Activities that cause leaking Other    Other activities that cause leaking after urinating will have to urinate again and has to push to urinate at the end    Urinary frequency urinate every 3-4 hours    Fecal incontinence Yes   severe constipation, on linzess, strain   Caffeine beverages drinks 1 cup per day    Skin Integrity Intact;Other    Skin Integrity other dryness    Palpation placed Q-tip into the vaginal canal and  patient had pain level 4/10 therefore did not go further with internal assessment    Strength --   not assess due to pain              [facility] Adult PT Treatment/Exercise - 10/23/21 0001       Manual Therapy   Manual Therapy Soft tissue mobilization;Myofascial release    Soft tissue mobilization soft tissue work to the abdominals, quadratus, thoracic and lumbar paraspinals, the obliques, diaphram and intercoastals of the lower rib cage    Myofascial Release using suction cups to the low thoracic and lumbar area to release the fascia; fascial work in the umbilicus to release the tissue, fascial release of the pubovesical ligaments and suprapubic area in sidely                       PT Short Term Goals - 10/23/21 1659       PT SHORT TERM GOAL #1   Title independent with initial HEP for meditation, stretches, and diaphragmatic breathing to relax the pelvic floor    Time 4    Period Weeks    Status Achieved    Target Date 09/04/21      PT SHORT TERM GOAL #2   Title understand how to perform manual abdominal massage to assist in peristalic motion of the intestines for easier bowel movements    Time 4    Period Weeks    Status Achieved    Target Date 09/04/21      PT SHORT TERM GOAL #3   Title understand different ways to manage pain with down regulating her nervous system    Time 4    Period Weeks    Status Achieved               PT Long Term Goals - 08/07/21 1940       PT LONG TERM GOAL #1   Title independent with advanced HEP with pain management, stretches, gentle strengthening and self massage techniques    Baseline not educated yet  Time 4    Period Months    Status New    Target Date 12/07/21      PT LONG TERM GOAL #2   Title able to allow the therapisr perform internal manual therapy and also have patient perform on herself to reduce trigger points of the pelvic floor to manage her pain    Baseline Q-tip causes level 4/10 pain vaginally,  therapist not able to place her finger into the vaginal canal at this time    Time 4    Period Months    Status New    Target Date 12/07/21      PT LONG TERM GOAL #3   Title Patient able to toilet correctly and reduce straining by 50% due to elongation of the pelvic floor muscles    Baseline has to strain to have a bowel movement    Time 4    Period Months    Status New    Target Date 12/07/21      PT LONG TERM GOAL #4   Title pelvic and abdominal pain at [facility] level </= 4/10 50% of the day due to reduction of trigger points    Baseline pain level 9/10    Time 4    Period Months    Status New    Target Date 12/07/21      PT LONG TERM GOAL #5   Title ability to urinate without double voiding due to relaxation of the pelvic floor muscles during urination    Baseline has to void 2 times and bear down to fully empty her bladder    Time 4    Period Months    Status New    Target Date 12/07/21                   Plan - 10/23/21 1655     Clinical Impression Statement Patient has had laproscopic surgery and has had abdominal pain since. Patient has fascial tightness in the abdomen, along the intercoastals, along the quadratus, iliopsoas, and around the umbilicus. Patient has increased lumbar lordosis due to fascial tightness in the lumbar and thoracic area. She has difficulty with diaphgramatic breathing due to the decresaed mobility of tissue. Patient pain has tighten for so long her muscle are used to be in contracted state.    Personal Factors and Comorbidities Fitness;Comorbidity 1;Sex;Past/Current Experience;Time since onset of injury/illness/exacerbation    Comorbidities endometriosis    Examination-Activity Limitations Locomotion Level;Lift;Stand;Stairs;Sit    Examination-Participation Restrictions Cleaning;Meal Prep;Occupation;Community Activity;Driving;Interpersonal Relationship;Laundry    Stability/Clinical Decision Making Evolving/Moderate complexity    Rehab  Potential Good    PT Frequency 1x / week    PT Duration Other (comment)   4 months   PT Treatment/Interventions ADLs/Self Care Home Management;Biofeedback;Cryotherapy;Moist Heat;Electrical Stimulation;Therapeutic activities;Therapeutic exercise;Patient/family education;Manual techniques;Dry needling;Spinal Manipulations    PT Next Visit Plan fascial work to the respiratory and urogenital diaphragm, fascial work around the thighs; abdominal bracing; quadruped lift extermity; bridges, thread the needle    PT Home Exercise Plan Access Code: [email or web address]    Consulted and Agree with Plan of Care Patient             Patient will benefit from skilled therapeutic intervention in order to improve the following deficits and impairments:  Decreased coordination, Increased fascial restricitons, Impaired tone, Decreased endurance, Increased muscle spasms, Decreased activity tolerance, Pain, Decreased strength  Visit Diagnosis: Muscle weakness (generalized)  Cramp and spasm  Pelvic pain     Problem List Patient  Active Problem List   Diagnosis Date Noted   Chronic constipation 05/09/2020   History of DVT (deep vein thrombosis) 05/02/2020   Dysplasia of cervix, 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   Low grade squamous intraepith lesion on cytologic smear cervix (lgsil) 11/09/2019   Asthma, chronic 06/22/2013   Migraine, unspecified 06/22/2013   Arthralgia 06/13/2013   Endometriosis 06/13/2013   Edema of both legs 06/13/2013   Weight gain 06/13/2013    Eulis Foster, PT 10/23/21 5:05 PM  [facility] [address] [place], [place], [address] Phone: [phone]   Fax:  [phone]  Name: Lori Mays MRN: [id number] Date of Birth: 1986/11/28

## 2021-12-03 ENCOUNTER — Other Ambulatory Visit: Payer: Self-pay | Admitting: Gastroenterology

## 2021-12-04 ENCOUNTER — Ambulatory Visit: Payer: BC Managed Care – PPO | Attending: Student | Admitting: Physical Therapy

## 2021-12-04 ENCOUNTER — Encounter: Payer: Self-pay | Admitting: Physical Therapy

## 2021-12-04 ENCOUNTER — Other Ambulatory Visit: Payer: Self-pay

## 2021-12-04 DIAGNOSIS — R102 Pelvic and perineal pain: Secondary | ICD-10-CM | POA: Insufficient documentation

## 2021-12-04 DIAGNOSIS — M6281 Muscle weakness (generalized): Secondary | ICD-10-CM | POA: Diagnosis not present

## 2021-12-04 DIAGNOSIS — R252 Cramp and spasm: Secondary | ICD-10-CM | POA: Insufficient documentation

## 2021-12-04 NOTE — Therapy (Signed)
[facility] @ [facility] [address] [place], [place], [address] Phone: [phone]   Fax:  [phone]  Physical Therapy Treatment  Patient Details  Name: Lori Mays MRN: [id number] Date of Birth: [birth year] Referring Provider (PT): Dr. Carmelina Noun   Encounter Date: 12/04/2021   PT End of Session - 12/04/21 1620     Visit Number 4    Date for PT Re-Evaluation 02/26/22    Authorization Time Period 10/23/2021-12/07/2021    Authorization - Visit Number 3    Authorization - Number of Visits 4    PT Start Time 1615    PT Stop Time 1655    PT Time Calculation (min) 40 min    Activity Tolerance Patient tolerated treatment well;Patient limited by pain    Behavior During Therapy [facility] for tasks assessed/performed             Past Medical History:  Diagnosis Date   ASCUS with positive high risk HPV 06/16/2013   Asthma    Cyst 06/07/2012   DVT, lower extremity, proximal (HCC)    Endometriosis     Past Surgical History:  Procedure Laterality Date   laproscopy  2011    There were no vitals filed for this visit.   Subjective Assessment - 12/04/21 1617     Subjective I have been doing alot of sitting due to work being busy.                [facility] PT Assessment - 12/04/21 0001       Assessment   Medical Diagnosis M79.18 Myalgia of pelvic floor    Referring Provider (PT) Dr. Carmelina Noun    Prior Therapy none      Precautions   Precautions None      Restrictions   Weight Bearing Restrictions No      Home Environment   Living Environment Private residence      Prior Function   Level of Independence Independent    Vocation Full time employment    Vocation Requirements sitting at computer    Leisure no exercise      Cognition   Overall Cognitive Status Within Functional Limits for tasks assessed      Observation/Other Assessments   Focus on Therapeutic Outcomes (FOTO)  43      Posture/Postural  Control   Posture/Postural Control No significant limitations      AROM   Overall AROM Comments lumbar ROM is full      Strength   Right Hip Flexion 4/5    Right Hip Extension 4/5    Right Hip External Rotation  4/5    Right Hip Internal Rotation 4/5    Right Hip ABduction 4/5    Right Hip ADduction 4/5    Left Hip Flexion 4/5    Left Hip Extension 4/5    Left Hip External Rotation 4/5    Left Hip Internal Rotation 4/5    Left Hip ABduction 4/5    Left Hip ADduction 4/5                        Pelvic Floor Special Questions - 12/04/21 0001     Currently Sexually Active Yes    Is this Painful Yes    Urinary Leakage Yes    Activities that cause leaking Other    Other activities that cause leaking after urinating will have to urinate again and has to push to urinate at  the end    Urinary frequency urinate every 3-4 hours    Fecal incontinence Yes   severe constipation, on linzess, strain   Caffeine beverages drinks 1 cup per day    Palpation placed Q-tip into the vaginal canal and patient had pain level 4/10 therefore did not go further with internal assessment    Strength --   not assess due to pain              [facility] Adult PT Treatment/Exercise - 12/04/21 0001       Lumbar Exercises: Sidelying   Other Sidelying Lumbar Exercises sidely with breath movement and mobilzation of the diaphragm and assiting in movement of the rib cage      Lumbar Exercises: Quadruped   Other Quadruped Lumbar Exercises Qaudruped with suction cup on different areas of the back and patient goes into cat camel to mobilize the fascia, one suction cup on each side of the coccyx and patient has hips internally rotated moving inot different positions and therapist mobilizing the anococcygeal ligament and release the coccygeus      Manual Therapy   Manual Therapy Myofascial release    Myofascial Release pelvic decompression in prone with one hadn on lower abdominal and othe ron the  sacrum; release of the right lower quadrant in left sidely while moving the righ tleg into the abduction and extension; suction cup to the back and abdoment to release the fascia                     PT Education - 12/04/21 1709     Education Details educated patient on how to do tissue rolling to the abdomen and using the suction cup to release the fascia    Person(s) Educated Patient    Methods Explanation;Demonstration    Comprehension Verbalized understanding;Returned demonstration              PT Short Term Goals - 10/23/21 1659       PT SHORT TERM GOAL #1   Title independent with initial HEP for meditation, stretches, and diaphragmatic breathing to relax the pelvic floor    Time 4    Period Weeks    Status Achieved    Target Date 09/04/21      PT SHORT TERM GOAL #2   Title understand how to perform manual abdominal massage to assist in peristalic motion of the intestines for easier bowel movements    Time 4    Period Weeks    Status Achieved    Target Date 09/04/21      PT SHORT TERM GOAL #3   Title understand different ways to manage pain with down regulating her nervous system    Time 4    Period Weeks    Status Achieved               PT Long Term Goals - 12/04/21 1623       PT LONG TERM GOAL #1   Title independent with advanced HEP with pain management, stretches, gentle strengthening and self massage techniques    Baseline not educated yet    Time 4    Period Months    Status On-going    Target Date 12/07/21      PT LONG TERM GOAL #2   Title able to allow the therapisr perform internal manual therapy and also have patient perform on herself to reduce trigger points of the pelvic floor to manage her pain  Baseline Q-tip causes level 4/10 pain vaginally, therapist not able to place her finger into the vaginal canal at this time    Time 4    Period Months    Status On-going      PT LONG TERM GOAL #3   Title Patient able to toilet  correctly and reduce straining by 50% due to elongation of the pelvic floor muscles    Baseline stain to have a bowel movement has improved by 20%    Time 4    Period Months    Status On-going      PT LONG TERM GOAL #4   Title pelvic and abdominal pain at [facility] level </= 4/10 50% of the day due to reduction of trigger points    Baseline pain level 8/10    Time 4    Period Months    Status On-going      PT LONG TERM GOAL #5   Title ability to urinate without double voiding due to relaxation of the pelvic floor muscles during urination    Baseline has to void 2 times and bear down to fully empty her bladder    Time 4    Period Months    Status On-going                   Plan - 12/04/21 1710     Clinical Impression Statement Patient has had difficulty getting into therapy due to therapist schedule and her work schedule. Patient has weakness in her hips. She has tightness in the left SI joint. She has fascial tightness in the lumbar, sacral, abdominal and thighs. Patient is having trouble with bowel movements due to decreased tissue mobility. She is not able to have her skin lifted off her abdomen or back due to the fascial restrictions. Patient is still not able to have the therapist work internally due to pain. Q-tip test with pain from 3:00 to 9:00 superiorly. She has to strain to push her urine out due to difficulty relaxing the pelvic floor muscles. Patient will benefit from skilled therapy to reduce pain, improve tissue elongation and mobiltiy.    Personal Factors and Comorbidities Fitness;Comorbidity 1;Sex;Past/Current Experience;Time since onset of injury/illness/exacerbation    Comorbidities endometriosis    Examination-Activity Limitations Locomotion Level;Lift;Stand;Stairs;Sit    Examination-Participation Restrictions Cleaning;Meal Prep;Occupation;Community Activity;Driving;Interpersonal Relationship;Laundry    Stability/Clinical Decision Making Evolving/Moderate  complexity    Rehab Potential Good    PT Frequency 1x / week    PT Duration 12 weeks    PT Treatment/Interventions ADLs/Self Care Home Management;Biofeedback;Cryotherapy;Moist Heat;Electrical Stimulation;Therapeutic activities;Therapeutic exercise;Patient/family education;Manual techniques;Dry needling;Spinal Manipulations    PT Next Visit Plan fascial work around the abdominals; work on the outer labia and vulvar area; work on the bladder and uterus area; thread the needle,    PT Home Exercise Plan Access Code: [email or web address]    Recommended Other Services MD signed initial summary; sent renewal to MD and insurance    Consulted and Agree with Plan of Care Patient             Patient will benefit from skilled therapeutic intervention in order to improve the following deficits and impairments:  Decreased coordination, Increased fascial restricitons, Impaired tone, Decreased endurance, Increased muscle spasms, Decreased activity tolerance, Pain, Decreased strength  Visit Diagnosis: Muscle weakness (generalized) - Plan: PT plan of care cert/re-cert  Cramp and spasm - Plan: PT plan of care cert/re-cert  Pelvic pain - Plan: PT plan of care cert/re-cert  Problem List Patient Active Problem List   Diagnosis Date Noted   Chronic constipation 05/09/2020   History of DVT (deep vein thrombosis) 05/02/2020   Dysplasia of cervix, 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   Low grade squamous intraepith lesion on cytologic smear cervix (lgsil) 11/09/2019   Asthma, chronic 06/22/2013   Migraine, unspecified 06/22/2013   Arthralgia 06/13/2013   Endometriosis 06/13/2013   Edema of both legs 06/13/2013   Weight gain 06/13/2013    Eulis Foster, PT 12/04/21 5:19 PM    [facility] [address] [place], [place], [address] Phone: [phone]   Fax:  [phone]  Name: Brelyn Woehl MRN: [id number] Date of Birth: 1986-08-16

## 2021-12-11 ENCOUNTER — Encounter: Payer: Self-pay | Admitting: Physical Therapy

## 2021-12-11 ENCOUNTER — Ambulatory Visit: Payer: BC Managed Care – PPO | Admitting: Physical Therapy

## 2021-12-11 ENCOUNTER — Other Ambulatory Visit: Payer: Self-pay

## 2021-12-11 DIAGNOSIS — M6281 Muscle weakness (generalized): Secondary | ICD-10-CM | POA: Diagnosis not present

## 2021-12-11 DIAGNOSIS — R252 Cramp and spasm: Secondary | ICD-10-CM

## 2021-12-11 DIAGNOSIS — R102 Pelvic and perineal pain: Secondary | ICD-10-CM | POA: Diagnosis not present

## 2021-12-11 NOTE — Therapy (Signed)
[facility] [address] [place], [place], [address] Phone: [phone]   Fax:  [phone]  Physical Therapy Treatment  Patient Details  Name: Lori Mays MRN: [id number] Date of Birth: 1986-02-18 Referring Provider (PT): Dr. Carmelina Noun   Encounter Date: 12/11/2021   PT End of Session - 12/11/21 1719     Visit Number 5    Date for PT Re-Evaluation 02/26/22    Authorization Type [employer]    Authorization Time Period 10/23/2021-12/23/2021    PT Start Time 1615    PT Stop Time 1700    PT Time Calculation (min) 45 min    Activity Tolerance Patient tolerated treatment well    Behavior During Therapy [facility] for tasks assessed/performed             Past Medical History:  Diagnosis Date   ASCUS with positive high risk HPV 06/16/2013   Asthma    Cyst 06/07/2012   DVT, lower extremity, proximal (HCC)    Endometriosis     Past Surgical History:  Procedure Laterality Date   laproscopy  2011    There were no vitals filed for this visit.   Subjective Assessment - 12/11/21 1621     Subjective I felt good after last visit. I saw the chiropractor yesterday so I am sore.    Patient Stated Goals reduce pain    Currently in Pain? Yes    Pain Score 1     Pain Location Abdomen    Pain Orientation Lower    Pain Descriptors / Indicators Sore    Pain Type Chronic pain    Pain Onset More than a month ago    Pain Frequency Intermittent    Aggravating Factors  too much activity, lifting something heavy, going from sit to stand quickly    Pain Relieving Factors tylenol or topic solutions, reduce activity, heat    Multiple Pain Sites No                [facility] PT Assessment - 12/11/21 0001       Assessment   Medical Diagnosis M79.18 Myalgia of pelvic floor    Referring Provider (PT) Dr. Carmelina Noun    Prior Therapy none      Precautions   Precautions None      Restrictions   Weight Bearing  Restrictions No      Home Environment   Living Environment Private residence      Prior Function   Level of Independence Independent    Vocation Full time employment    Vocation Requirements sitting at computer    Leisure no exercise      Cognition   Overall Cognitive Status Within Functional Limits for tasks assessed      Observation/Other Assessments   Focus on Therapeutic Outcomes (FOTO)  43      AROM   Overall AROM Comments lumbar ROM is full      Strength   Right Hip Flexion 4/5    Right Hip Extension 4/5    Right Hip External Rotation  4/5    Right Hip Internal Rotation 4/5    Right Hip ABduction 4/5    Right Hip ADduction 4/5    Left Hip Flexion 4/5    Left Hip Extension 4/5    Left Hip External Rotation 4/5    Left Hip Internal Rotation 4/5    Left Hip ABduction 4/5    Left Hip ADduction 4/5  Pelvic Floor Special Questions - 12/11/21 0001     Currently Sexually Active Yes    Is this Painful Yes    Urinary Leakage Yes    Activities that cause leaking Other    Other activities that cause leaking after urinating will have to urinate again and has to push to urinate at the end    Urinary frequency urinate every 3-4 hours    Fecal incontinence Yes   severe constipation, on linzess, strain   Caffeine beverages drinks 1 cup per day    Skin Integrity Intact;Other    Skin Integrity other dryness    Pelvic Floor Internal Exam Patient confirms identification and approve PT to assess pelvic floor and treatment    Exam Type Vaginal    Palpation patient was able to tolerate the therapist to plance her finger inthe vaginal canal for first time    Strength --   not assess due to pain              [facility] Adult PT Treatment/Exercise - 12/11/21 0001       Self-Care   Self-Care Other Self-Care Comments    Other Self-Care Comments  educated patient on pelvic floor meditation and how to perform perineal massage using the [facility] reveleum      Exercises   Exercises Other Exercises    Other Exercises  sititng on physioball with circles, hip sway, pelvic tilt; supine with feet on the green physioball and rocking legs side to side then bringing ball to chest and back      Lumbar Exercises: Stretches   Other Lumbar Stretch Exercise thread the needle both ways      Manual Therapy   Manual Therapy Myofascial release;Internal Pelvic Floor;Soft tissue mobilization    Soft tissue mobilization circular massage around the abdomen to reduce the bloating    Myofascial Release fascial release around the bladder area and uterus; release along the left abdominal region by the kidney, release around the umbilicus and pubovesical ligament.                     PT Education - 12/11/21 1719     Education Details educated patient on perineal massage and pelvic floor meditation    Person(s) Educated Patient    Methods Explanation    Comprehension Verbalized understanding              PT Short Term Goals - 12/11/21 1724       PT SHORT TERM GOAL #1   Title independent with initial HEP for meditation, stretches, and diaphragmatic breathing to relax the pelvic floor    Time 4    Period Weeks    Status Achieved    Target Date 09/04/21      PT SHORT TERM GOAL #2   Title understand how to perform manual abdominal massage to assist in peristalic motion of the intestines for easier bowel movements    Time 4    Period Weeks    Status Achieved      PT SHORT TERM GOAL #3   Title understand different ways to manage pain with down regulating her nervous system    Time 4    Period Weeks    Status Achieved               PT Long Term Goals - 12/11/21 1724       PT LONG TERM GOAL #1   Title independent with advanced HEP with  pain management, stretches, gentle strengthening and self massage techniques    Baseline not educated yet    Time 4    Period Months    Status On-going    Target Date 12/07/21       PT LONG TERM GOAL #2   Title able to allow the therapisr perform internal manual therapy and also have patient perform on herself to reduce trigger points of the pelvic floor to manage her pain    Baseline able to have the therapist place her finger in part way into the vaginal canal using a lidocaine cream    Time 4    Period Months    Status On-going    Target Date 12/07/21      PT LONG TERM GOAL #3   Title Patient able to toilet correctly and reduce straining by 50% due to elongation of the pelvic floor muscles    Baseline stain to have a bowel movement has improved by 20%    Time 4    Period Months    Status On-going    Target Date 12/07/21      PT LONG TERM GOAL #4   Title pelvic and abdominal pain at [facility] level </= 4/10 50% of the day due to reduction of trigger points    Baseline pain level 8/10    Time 4    Period Months    Status On-going    Target Date 12/07/21      PT LONG TERM GOAL #5   Title ability to urinate without double voiding due to relaxation of the pelvic floor muscles during urination    Baseline has to void 2 times and bear down to fully empty her bladder    Time 4    Period Months    Status On-going    Target Date 12/07/21                   Plan - 12/11/21 1720     Clinical Impression Statement Patient had increased bloating of her abdomen today. After manual work the bloating reduced. She has increased fascial restrictions in the abdomen, around the bladder and uterus. Patient was able to allow the therapist to perform manual work around the labia, ischiocavernosus and the introitus for the first time using the [employer]. Patient reports she feels better after each physical therapy session. She continues to have weakness in her hips and tightness in the lumbar and gluteal. Patient has to strain to push her urine out due to difficulty relaxing the pelvic floor muscles Patient will benefit from skilled therapy to reduce  pain, improve tissue elongation and mobility.    Personal Factors and Comorbidities Fitness;Comorbidity 1;Sex;Past/Current Experience;Time since onset of injury/illness/exacerbation    Comorbidities endometriosis    Examination-Activity Limitations Locomotion Level;Lift;Stand;Stairs;Sit    Examination-Participation Restrictions Cleaning;Meal Prep;Occupation;Community Activity;Driving;Interpersonal Relationship;Laundry    Stability/Clinical Decision Making Evolving/Moderate complexity    Rehab Potential Good    PT Frequency 1x / week    PT Duration 12 weeks    PT Treatment/Interventions ADLs/Self Care Home Management;Biofeedback;Cryotherapy;Moist Heat;Electrical Stimulation;Therapeutic activities;Therapeutic exercise;Patient/family education;Manual techniques;Dry needling;Spinal Manipulations    PT Next Visit Plan fascial work around the abdominals; work on the outer labia and vulvar area; work on the bladder and uterus area; work internally with [employer] reveleum    PT Home Exercise Plan Access Code: [employer]    Consulted and Agree with Plan of Care Patient  Patient will benefit from skilled therapeutic intervention in order to improve the following deficits and impairments:  Decreased coordination, Increased fascial restricitons, Impaired tone, Decreased endurance, Increased muscle spasms, Decreased activity tolerance, Pain, Decreased strength  Visit Diagnosis: Muscle weakness (generalized)  Cramp and spasm  Pelvic pain     Problem List Patient Active Problem List   Diagnosis Date Noted   Chronic constipation 05/09/2020   History of DVT (deep vein thrombosis) 05/02/2020   Dysplasia of cervix, 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   Low grade squamous intraepith lesion on cytologic smear cervix (lgsil) 11/09/2019   Asthma, chronic 06/22/2013   Migraine, unspecified 06/22/2013   Arthralgia 06/13/2013   Endometriosis 06/13/2013   Edema of both legs 06/13/2013   Weight  gain 06/13/2013    Eulis Foster, PT 12/11/21 5:26 PM   [facility] [address] [place], [place], [address] Phone: [phone]   Fax:  [phone]  Name: Lori Mays MRN: [id number] Date of Birth: 08/14/1986

## 2021-12-19 DIAGNOSIS — Z79899 Other long term (current) drug therapy: Secondary | ICD-10-CM | POA: Diagnosis not present

## 2021-12-19 DIAGNOSIS — M7918 Myalgia, other site: Secondary | ICD-10-CM | POA: Diagnosis not present

## 2021-12-19 DIAGNOSIS — N809 Endometriosis, unspecified: Secondary | ICD-10-CM | POA: Diagnosis not present

## 2021-12-26 DIAGNOSIS — Z780 Asymptomatic menopausal state: Secondary | ICD-10-CM | POA: Diagnosis not present

## 2021-12-27 DIAGNOSIS — Z3162 Encounter for fertility preservation counseling: Secondary | ICD-10-CM | POA: Diagnosis not present

## 2021-12-27 DIAGNOSIS — N838 Other noninflammatory disorders of ovary, fallopian tube and broad ligament: Secondary | ICD-10-CM | POA: Diagnosis not present

## 2021-12-27 DIAGNOSIS — N809 Endometriosis, unspecified: Secondary | ICD-10-CM | POA: Diagnosis not present

## 2022-01-01 ENCOUNTER — Ambulatory Visit: Payer: BC Managed Care – PPO | Attending: Student | Admitting: Physical Therapy

## 2022-01-01 ENCOUNTER — Encounter: Payer: Self-pay | Admitting: Physical Therapy

## 2022-01-01 ENCOUNTER — Other Ambulatory Visit: Payer: Self-pay

## 2022-01-01 DIAGNOSIS — R252 Cramp and spasm: Secondary | ICD-10-CM | POA: Insufficient documentation

## 2022-01-01 DIAGNOSIS — R102 Pelvic and perineal pain: Secondary | ICD-10-CM | POA: Diagnosis not present

## 2022-01-01 DIAGNOSIS — M6281 Muscle weakness (generalized): Secondary | ICD-10-CM | POA: Diagnosis not present

## 2022-01-01 NOTE — Therapy (Signed)
[facility] [address] [place], [place], [address] Phone: [phone]   Fax:  [phone]  Physical Therapy Treatment  Patient Details  Name: Lori Mays MRN: [id number] Date of Birth: [birth year] Referring Provider (PT): Dr. Carmelina Noun   Encounter Date: 01/01/2022   PT End of Session - 01/01/22 1530     Visit Number 6    Date for PT Re-Evaluation 02/26/22    Authorization Type [employer]    Authorization Time Period 02/23/22    Authorization - Visit Number 1    PT Start Time 1530    PT Stop Time 1610    PT Time Calculation (min) 40 min    Activity Tolerance Patient tolerated treatment well    Behavior During Therapy [facility] for tasks assessed/performed             Past Medical History:  Diagnosis Date   ASCUS with positive high risk HPV 06/16/2013   Asthma    Cyst 06/07/2012   DVT, lower extremity, proximal (HCC)    Endometriosis     Past Surgical History:  Procedure Laterality Date   laproscopy  2011    There were no vitals filed for this visit.   Subjective Assessment - 01/01/22 1532     Subjective the pain has been better. I would like to get back to working out and dancing. Not having abdominal pain.    Patient Stated Goals reduce pain    Currently in Pain? Yes    Pain Score 3     Pain Location Back    Pain Orientation Left;Mid    Pain Descriptors / Indicators Discomfort    Pain Type Chronic pain    Pain Onset More than a month ago    Pain Frequency Intermittent    Aggravating Factors  slouching    Pain Relieving Factors massage    Multiple Pain Sites No                            Pelvic Floor Special Questions - 01/01/22 0001     Pelvic Floor Internal Exam Patient confirms identification and approve PT to assess pelvic floor and treatment    Exam Type Vaginal               [facility] Adult PT Treatment/Exercise - 01/01/22 0001       Lumbar Exercises: Standing    Other Standing Lumbar Exercises pallof with red band 10x each way; standing with band in each hand and facing door while alternate shoulder extension      Lumbar Exercises: Seated   Other Seated Lumbar Exercises seated ha breath with hands on rib cage and assisting with downward movement      Manual Therapy   Manual Therapy Myofascial release    Myofascial Release release of the broad ligamet in righ tsidely with movement of the left leg to engage the restrictions; right sidely with release of the left lateral abdominal area feeling through the restrictions; release of the uterosacral ligament in right sidely with one hand on the sacrum and other on the funus of the uterus                     PT Education - 01/01/22 1613     Education Details education on lower rib cage movement and downward movement and breath in sidely and sitting    Person(s) Educated Patient  Methods Explanation;Demonstration;Handout    Comprehension Returned demonstration;Verbalized understanding              PT Short Term Goals - 12/11/21 1724       PT SHORT TERM GOAL #1   Title independent with initial HEP for meditation, stretches, and diaphragmatic breathing to relax the pelvic floor    Time 4    Period Weeks    Status Achieved    Target Date 09/04/21      PT SHORT TERM GOAL #2   Title understand how to perform manual abdominal massage to assist in peristalic motion of the intestines for easier bowel movements    Time 4    Period Weeks    Status Achieved      PT SHORT TERM GOAL #3   Title understand different ways to manage pain with down regulating her nervous system    Time 4    Period Weeks    Status Achieved               PT Long Term Goals - 12/11/21 1724       PT LONG TERM GOAL #1   Title independent with advanced HEP with pain management, stretches, gentle strengthening and self massage techniques    Baseline not educated yet    Time 4    Period Months     Status On-going    Target Date 12/07/21      PT LONG TERM GOAL #2   Title able to allow the therapisr perform internal manual therapy and also have patient perform on herself to reduce trigger points of the pelvic floor to manage her pain    Baseline able to have the therapist place her finger in part way into the vaginal canal using a lidocaine cream    Time 4    Period Months    Status On-going    Target Date 12/07/21      PT LONG TERM GOAL #3   Title Patient able to toilet correctly and reduce straining by 50% due to elongation of the pelvic floor muscles    Baseline stain to have a bowel movement has improved by 20%    Time 4    Period Months    Status On-going    Target Date 12/07/21      PT LONG TERM GOAL #4   Title [facility] </= 4/10 50% of the day due to reduction of trigger points    Baseline pain level 8/10    Time 4    Period Months    Status On-going    Target Date 12/07/21      PT LONG TERM GOAL #5   Title ability to urinate without double voiding due to relaxation of the pelvic floor muscles during urination    Baseline has to void 2 times and bear down to fully empty her bladder    Time 4    Period Months    Status On-going    Target Date 12/07/21                   Plan - 01/01/22 1616     Clinical Impression Statement Patient is having less pain coming to therapy. She has restrictions in theleft side of the abdomen, along the uterus, and uterosacral ligament. Patient is learning how to breath into the back to improve rib cage mobility to reduce her back pain. Patient images show restrictions of the ovarys  and fallopian tubes. Patient is able to engage her abdominals with arm movment. Patient will benefit from skilled therapy to reduce pain, improve tissue elongation and mobility.    Personal Factors and Comorbidities Fitness;Comorbidity 1;Sex;Past/Current Experience;Time since onset of injury/illness/exacerbation     Comorbidities endometriosis    Examination-Activity Limitations Locomotion Level;Lift;Stand;Stairs;Sit    Examination-Participation Restrictions Cleaning;Meal Prep;Occupation;Community Activity;Driving;Interpersonal Relationship;Laundry    Stability/Clinical Decision Making Evolving/Moderate complexity    Rehab Potential Good    PT Frequency 1x / week    PT Duration 12 weeks    PT Treatment/Interventions ADLs/Self Care Home Management;Biofeedback;Cryotherapy;Moist Heat;Electrical Stimulation;Therapeutic activities;Therapeutic exercise;Patient/family education;Manual techniques;Dry needling;Spinal Manipulations    PT Next Visit Plan fascial work around the abdominals; work on the uterus, ovaries, and fallopian tubes; use the foam roll to roll out the back, and legs    PT Home Exercise Plan Access Code: [email or web address]    Consulted and Agree with Plan of Care Patient             Patient will benefit from skilled therapeutic intervention in order to improve the following deficits and impairments:  Decreased coordination, Increased fascial restricitons, Impaired tone, Decreased endurance, Increased muscle spasms, Decreased activity tolerance, Pain, Decreased strength  Visit Diagnosis: Muscle weakness (generalized)  Pelvic pain  Cramp and spasm     Problem List Patient Active Problem List   Diagnosis Date Noted   Chronic constipation 05/09/2020   History of DVT (deep vein thrombosis) 05/02/2020   Dysplasia of cervix, 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   Low grade squamous intraepith lesion on cytologic smear cervix (lgsil) 11/09/2019   Asthma, chronic 06/22/2013   Migraine, unspecified 06/22/2013   Arthralgia 06/13/2013   Endometriosis 06/13/2013   Edema of both legs 06/13/2013   Weight gain 06/13/2013   Eulis Foster, PT 01/01/22 4:20 PM  [facility] [address] [place], [place], [address] Phone: [phone]   Fax:   [phone]  Name: Lori Mays MRN: [id number] Date of Birth: 01/06/1986

## 2022-01-08 ENCOUNTER — Other Ambulatory Visit: Payer: Self-pay

## 2022-01-08 ENCOUNTER — Encounter: Payer: Self-pay | Admitting: Physical Therapy

## 2022-01-08 ENCOUNTER — Ambulatory Visit: Payer: BC Managed Care – PPO | Admitting: Physical Therapy

## 2022-01-08 DIAGNOSIS — M6281 Muscle weakness (generalized): Secondary | ICD-10-CM

## 2022-01-08 DIAGNOSIS — R252 Cramp and spasm: Secondary | ICD-10-CM | POA: Diagnosis not present

## 2022-01-08 DIAGNOSIS — R102 Pelvic and perineal pain: Secondary | ICD-10-CM

## 2022-01-08 NOTE — Therapy (Signed)
[facility] [address] [place], [place], [address] Phone: [phone]   Fax:  [phone]  Physical Therapy Treatment  Patient Details  Name: Lori Mays MRN: [id number] Date of Birth: 06-28-1986 Referring Provider (PT): Dr. Carmelina Noun   Encounter Date: 01/08/2022   PT End of Session - 01/08/22 1557     Visit Number 7    Date for PT Re-Evaluation 02/26/22    Authorization Type [employer]    Authorization Time Period 02/23/22    Authorization - Visit Number 2    Authorization - Number of Visits 4    PT Start Time 1540    PT Stop Time 1625    PT Time Calculation (min) 45 min    Activity Tolerance Patient tolerated treatment well;No increased pain    Behavior During Therapy [facility] for tasks assessed/performed             Past Medical History:  Diagnosis Date   ASCUS with positive high risk HPV 06/16/2013   Asthma    Cyst 06/07/2012   DVT, lower extremity, proximal (HCC)    Endometriosis     Past Surgical History:  Procedure Laterality Date   laproscopy  2011    There were no vitals filed for this visit.   Subjective Assessment - 01/08/22 1541     Subjective I felt better after manual work.    Patient Stated Goals reduce pain    Currently in Pain? Yes    Pain Score 3     Pain Location Back    Pain Orientation Left;Mid    Pain Descriptors / Indicators Discomfort    Pain Type Chronic pain    Pain Onset More than a month ago    Pain Frequency Intermittent    Aggravating Factors  slouching    Pain Relieving Factors massage    Multiple Pain Sites No                            Pelvic Floor Special Questions - 01/08/22 0001     External Palpation negative for q-tip test    Pelvic Floor Internal Exam Patient confirms identification and approve PT to assess pelvic floor and treatment    Exam Type Vaginal               [facility] Adult PT Treatment/Exercise - 01/08/22 0001        Lumbar Exercises: Stretches   Hip Flexor Stretch Right;Left;1 rep;30 seconds    Hip Flexor Stretch Limitations prone on foam roll    ITB Stretch Right;Left;1 rep;30 seconds    ITB Stretch Limitations sidely on foam roll    Piriformis Stretch Right;Left;1 rep;30 seconds    Piriformis Stretch Limitations sit on foam roll    Other Lumbar Stretch Exercise supine on foam roll to realign the spine and chest stretch      Manual Therapy   Manual Therapy Internal Pelvic Floor;Myofascial release    Myofascial Release urogenital diaphragm release with therapist going through the layers and feeling the tissue elongate    Internal Pelvic Floor manual release along the right side of the bladder, uterus, along the fallopian tubes and ovary; manual work to the levator ani and posterior vaginal canal with hip movements and one finger internally and one externally                     PT Education - 01/08/22 1629  Education Details Access Code: [email or web address]  using the foam roll    Person(s) Educated Patient    Methods Explanation;Demonstration;Verbal cues;Handout    Comprehension Returned demonstration;Verbalized understanding              PT Short Term Goals - 12/11/21 1724       PT SHORT TERM GOAL #1   Title independent with initial HEP for meditation, stretches, and diaphragmatic breathing to relax the pelvic floor    Time 4    Period Weeks    Status Achieved    Target Date 09/04/21      PT SHORT TERM GOAL #2   Title understand how to perform manual abdominal massage to assist in peristalic motion of the intestines for easier bowel movements    Time 4    Period Weeks    Status Achieved      PT SHORT TERM GOAL #3   Title understand different ways to manage pain with down regulating her nervous system    Time 4    Period Weeks    Status Achieved               PT Long Term Goals - 01/08/22 1636       PT LONG TERM GOAL #1   Title independent with advanced HEP  with pain management, stretches, gentle strengthening and self massage techniques    Time 4    Period Months    Status On-going      PT LONG TERM GOAL #2   Title able to allow the therapisr perform internal manual therapy and also have patient perform on herself to reduce trigger points of the pelvic floor to manage her pain    Time 4    Period Months    Status Achieved      PT LONG TERM GOAL #3   Title Patient able to toilet correctly and reduce straining by 50% due to elongation of the pelvic floor muscles    Time 4    Period Months    Status On-going      PT LONG TERM GOAL #4   Title pelvic and abdominal pain at [facility] level </= 4/10 50% of the day due to reduction of trigger points    Time 4    Period Months    Status On-going    Target Date 12/07/21      PT LONG TERM GOAL #5   Title ability to urinate without double voiding due to relaxation of the pelvic floor muscles during urination    Baseline has to void 2 times and bear down to fully empty her bladder    Time 4    Period Months    Status On-going                   Plan - 01/08/22 1630     Clinical Impression Statement Patient pain level is staying at a lower level. She has learned how to foam roll her leg muscles to work on trigger points. Patient had increased released around the urogenital diaphragm. Patient had decreased mobiltiy of the cervix and aorund the right reporductive system. Patient has a negative q-tip test. She was able to have the therapist perform internal work around the anterior wall of the vagina up to the cervix for the first time. Patient will benefit from skilled therapy to reduce her pain, improve tissue elongation and mobility.    Personal Factors and Comorbidities Fitness;Comorbidity 1;Sex;Past/Current Experience;Time since onset of  injury/illness/exacerbation    Comorbidities endometriosis    Examination-Activity Limitations Locomotion Level;Lift;Stand;Stairs;Sit     Examination-Participation Restrictions Cleaning;Meal Prep;Occupation;Community Activity;Driving;Interpersonal Relationship;Laundry    Stability/Clinical Decision Making Evolving/Moderate complexity    Rehab Potential Good    PT Frequency 1x / week    PT Duration 12 weeks    PT Treatment/Interventions ADLs/Self Care Home Management;Biofeedback;Cryotherapy;Moist Heat;Electrical Stimulation;Therapeutic activities;Therapeutic exercise;Patient/family education;Manual techniques;Dry needling;Spinal Manipulations    PT Next Visit Plan fascial release internally around the urterus, cervix, fallopian tube, bladder; urogenital diaphragm; hand in isurance auth    PT Home Exercise Plan Access Code: [email or web address]    Consulted and Agree with Plan of Care Patient             Patient will benefit from skilled therapeutic intervention in order to improve the following deficits and impairments:  Decreased coordination, Increased fascial restricitons, Impaired tone, Decreased endurance, Increased muscle spasms, Decreased activity tolerance, Pain, Decreased strength  Visit Diagnosis: Muscle weakness (generalized)  Pelvic pain  Cramp and spasm     Problem List Patient Active Problem List   Diagnosis Date Noted   Chronic constipation 05/09/2020   History of DVT (deep vein thrombosis) 05/02/2020   Dysplasia of cervix, 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   Low grade squamous intraepith lesion on cytologic smear cervix (lgsil) 11/09/2019   Asthma, chronic 06/22/2013   Migraine, unspecified 06/22/2013   Arthralgia 06/13/2013   Endometriosis 06/13/2013   Edema of both legs 06/13/2013   Weight gain 06/13/2013    Eulis Foster, PT 01/08/22 4:38 PM  [facility] [address] [place], [place], [address] Phone: [phone]   Fax:  [phone]  Name: Sahiti Joswick MRN: [id number] Date of Birth: 1986/07/30

## 2022-01-08 NOTE — Patient Instructions (Signed)
Access Code: [email or web address] URL: [email or web address] Date: 01/08/2022 Prepared by: Eulis Foster  Exercises Piriformis Mobilization on Foam Roll - 1 x daily - 7 x weekly - 3 sets - 10 reps Quadriceps Mobilization with Foam Roll - 1 x daily - 7 x weekly - 3 sets - 10 reps Sidelying IT Band Foam Roll Mobilization - 1 x daily - 7 x weekly - 3 sets - 10 reps Supine Static Chest Stretch on Foam Roll - 1 x daily - 7 x weekly - 3 sets - 10 reps Supine Thoracic Mobilization Foam Roll Vertical - 1 x daily - 7 x weekly - 3 sets - 10 reps [facility] [address] [place], [place] [address] Phone # [phone] Fax [phone]

## 2022-01-15 ENCOUNTER — Encounter: Payer: Self-pay | Admitting: Physical Therapy

## 2022-01-15 ENCOUNTER — Ambulatory Visit: Payer: BC Managed Care – PPO | Admitting: Physical Therapy

## 2022-01-15 ENCOUNTER — Other Ambulatory Visit: Payer: Self-pay

## 2022-01-15 DIAGNOSIS — R252 Cramp and spasm: Secondary | ICD-10-CM | POA: Diagnosis not present

## 2022-01-15 DIAGNOSIS — R102 Pelvic and perineal pain: Secondary | ICD-10-CM

## 2022-01-15 DIAGNOSIS — M6281 Muscle weakness (generalized): Secondary | ICD-10-CM | POA: Diagnosis not present

## 2022-01-15 NOTE — Therapy (Signed)
[facility] [address] [place], [place], [address] Phone: [phone]   Fax:  [phone]  Physical Therapy Treatment  Patient Details  Name: Lori Mays MRN: [id number] Date of Birth: Mar 19, 1986 Referring Provider (Lori Mays): Dr. Carmelina Noun   Encounter Date: 01/15/2022   Lori Mays End of Session - 01/15/22 1700     Visit Number 8    Date for Lori Mays Re-Evaluation 02/26/22    Authorization Type [employer]    Authorization Time Period 02/23/22    Authorization - Visit Number 6    Authorization - Number of Visits 6    Lori Mays Start Time 1615    Lori Mays Stop Time 1655    Lori Mays Time Calculation (min) 40 min    Activity Tolerance Patient tolerated treatment well;No increased pain    Behavior During Therapy [facility] for tasks assessed/performed             Past Medical History:  Diagnosis Date   ASCUS with positive high risk HPV 06/16/2013   Asthma    Cyst 06/07/2012   DVT, lower extremity, proximal (HCC)    Endometriosis     Past Surgical History:  Procedure Laterality Date   laproscopy  2011    There were no vitals filed for this visit.   Subjective Assessment - 01/15/22 1622     Subjective I felt good after last visit.    Patient Stated Goals reduce pain    Currently in Pain? Yes    Pain Score 5     Pain Location Abdomen    Pain Orientation Anterior    Pain Descriptors / Indicators Discomfort    Pain Type Chronic pain    Pain Onset More than a month ago    Pain Frequency Intermittent    Aggravating Factors  random    Pain Relieving Factors massage and heat    Multiple Pain Sites No                [facility] Lori Mays Assessment - 01/15/22 0001       Assessment   Medical Diagnosis M79.18 Myalgia of pelvic floor    Referring Provider (Lori Mays) Dr. Carmelina Noun    Prior Therapy none      Precautions   Precautions None      Restrictions   Weight Bearing Restrictions No      Home Environment   Living Environment Private  residence      Prior Function   Level of Independence Independent    Vocation Full time employment    Vocation Requirements sitting at computer    Leisure no exercise      Cognition   Overall Cognitive Status Within Functional Limits for tasks assessed      AROM   Overall AROM Comments lumbar ROM is full      Strength   Right Hip Flexion 4/5    Right Hip Extension 4/5    Right Hip External Rotation  4/5    Right Hip Internal Rotation 4/5    Right Hip ABduction 4/5    Right Hip ADduction 4/5    Left Hip Flexion 4/5    Left Hip Extension 4/5    Left Hip External Rotation 4/5    Left Hip Internal Rotation 4/5    Left Hip ABduction 4/5    Left Hip ADduction 4/5                        Pelvic  Floor Special Questions - 01/15/22 0001     Currently Sexually Active Yes    Is this Painful Yes    Other activities that cause leaking after urinating will have to urinate again and has to push to urinate at the end    Urinary frequency urinate every 3-4 hours    Fecal incontinence Yes   severe constipation, on linzess, strain   External Palpation negative q-TIP TEST    Pelvic Floor Internal Exam Patient confirms identification and approve Lori Mays to assess pelvic floor and treatment    Exam Type Vaginal    Strength weak squeeze, no lift   trouble relaxing the pelvic floor after contraction              [facility] Adult Lori Mays Treatment/Exercise - 01/15/22 0001       Lumbar Exercises: Standing   Other Standing Lumbar Exercises stand on power plate fo r1 monute at 30 hz to stimulate lymph system      Manual Therapy   Manual Therapy Myofascial release;Internal Pelvic Floor    Myofascial Release release of the respiratory diaphragm and pelvic diaphragm;    Internal Pelvic Floor manual release along the right side of the bladder, uterus, along the fallopian tubes and ovary; release of the urachus ligament and pubovesical ligament                       Lori Mays Short  Term Goals - 01/15/22 1712       Lori Mays SHORT TERM GOAL #1   Title independent with initial HEP for meditation, stretches, and diaphragmatic breathing to relax the pelvic floor    Time 4    Period Weeks    Status Achieved    Target Date 09/04/21      Lori Mays SHORT TERM GOAL #2   Title understand how to perform manual abdominal massage to assist in peristalic motion of the intestines for easier bowel movements    Time 4    Period Weeks    Status Achieved    Target Date 09/04/21      Lori Mays SHORT TERM GOAL #3   Title understand different ways to manage pain with down regulating her nervous system    Time 4    Period Weeks    Status Achieved    Target Date 09/04/21               Lori Mays Long Term Goals - 01/15/22 1713       Lori Mays LONG TERM GOAL #1   Title independent with advanced HEP with pain management, stretches, gentle strengthening and self massage techniques    Baseline HEP is updated as she progresses    Time 4    Period Weeks    Status On-going    Target Date 04/09/22      Lori Mays LONG TERM GOAL #2   Title able to allow the therapisr perform internal manual therapy and also have patient perform on herself to reduce trigger points of the pelvic floor to manage her pain    Baseline patient is able to have the therapist perfrom internal work but she has not been educated yet    Time 4    Period Months    Status On-going    Target Date 04/09/22      Lori Mays LONG TERM GOAL #3   Title Patient able to toilet correctly and reduce straining by 50% due to elongation of the pelvic floor muscles    Baseline  stain to have a bowel movement has improved by 20%    Time 4    Period Months    Status On-going    Target Date 04/09/22      Lori Mays LONG TERM GOAL #4   Title pelvic [facility] </= 4/10 50% of the day due to reduction of trigger points    Baseline pain level 5/10    Time 4    Period Months    Status On-going    Target Date 04/09/22      Lori Mays LONG TERM GOAL #5    Title ability to urinate without double voiding due to relaxation of the pelvic floor muscles during urination    Baseline has to void 2 times and bear down to fully empty her bladder    Time 4    Period Months    Status On-going    Target Date 04/09/22                   Plan - 01/15/22 1705     Clinical Impression Statement Patient has difficulty emptying her bladder so she has to double void. Patient has difficulty with relaxing her pelvic floor due to pain so she is not able to fully empty her bladder the first void. Patient continues to have times of abdominal bloating but not as bad. Patient hip strength is 4/5. Patient has tightness throughout the abdomen and difficulty with diaphragmatic breathing to relax the pelvic floor. Patient has tenderness and restrictions throughout the pelvic floor muscles, urethra, bladder and cervix. Patient is now able to have the therapist perform internal work. Patient will benefit from skilled therapy to reduce her pain, improve tissue elongation and mobility.    Personal Factors and Comorbidities Fitness;Comorbidity 1;Sex;Past/Current Experience;Time since onset of injury/illness/exacerbation    Comorbidities endometriosis    Examination-Activity Limitations Locomotion Level;Lift;Stand;Stairs;Sit    Examination-Participation Restrictions Cleaning;Meal Prep;Occupation;Community Activity;Driving;Interpersonal Relationship;Laundry    Stability/Clinical Decision Making Evolving/Moderate complexity    Rehab Potential Good    Lori Mays Frequency 1x / week    Lori Mays Duration 12 weeks    Lori Mays Treatment/Interventions ADLs/Self Care Home Management;Biofeedback;Cryotherapy;Moist Heat;Electrical Stimulation;Therapeutic activities;Therapeutic exercise;Patient/family education;Manual techniques;Dry needling;Spinal Manipulations    Lori Mays Next Visit Plan fascial release internally around the urterus, cervix, fallopian tube, bladder; urogenital diaphragm; see if insurance  approved visits; use power plate initially    Lori Mays Home Exercise Plan Access Code: [email or web address]    Consulted and Agree with Plan of Care Patient             Patient will benefit from skilled therapeutic intervention in order to improve the following deficits and impairments:  Decreased coordination, Increased fascial restricitons, Impaired tone, Decreased endurance, Increased muscle spasms, Decreased activity tolerance, Pain, Decreased strength  Visit Diagnosis: Muscle weakness (generalized)  Pelvic pain  Cramp and spasm     Problem List Patient Active Problem List   Diagnosis Date Noted   Chronic constipation 05/09/2020   History of DVT (deep vein thrombosis) 05/02/2020   Dysplasia of cervix, 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   Low grade squamous intraepith lesion on cytologic smear cervix (lgsil) 11/09/2019   Asthma, chronic 06/22/2013   Migraine, unspecified 06/22/2013   Arthralgia 06/13/2013   Endometriosis 06/13/2013   Edema of both legs 06/13/2013   Weight gain 06/13/2013    Lori Mays, Lori Mays 01/15/22 5:17 PM   [facility] [address] [place], [place], [address] Phone: [phone]  Fax:  [phone]  Name: Lori Mays MRN: [id number] Date of Birth: 1986-01-04

## 2022-01-22 ENCOUNTER — Ambulatory Visit: Payer: BC Managed Care – PPO | Admitting: Physical Therapy

## 2022-01-29 ENCOUNTER — Encounter: Payer: Self-pay | Admitting: Physical Therapy

## 2022-01-29 ENCOUNTER — Ambulatory Visit: Payer: BC Managed Care – PPO | Attending: Student | Admitting: Physical Therapy

## 2022-01-29 ENCOUNTER — Other Ambulatory Visit: Payer: Self-pay

## 2022-01-29 DIAGNOSIS — R252 Cramp and spasm: Secondary | ICD-10-CM | POA: Insufficient documentation

## 2022-01-29 DIAGNOSIS — R102 Pelvic and perineal pain: Secondary | ICD-10-CM | POA: Insufficient documentation

## 2022-01-29 DIAGNOSIS — M6281 Muscle weakness (generalized): Secondary | ICD-10-CM | POA: Diagnosis not present

## 2022-01-29 NOTE — Therapy (Signed)
[facility] [address] [place], [place], [address] Phone: [phone]   Fax:  [phone]  Physical Therapy Treatment  Patient Details  Name: Lori Mays MRN: [id number] Date of Birth: [birth year] Referring Provider (PT): Dr. Carmelina Noun   Encounter Date: 01/29/2022   PT End of Session - 01/29/22 1624     Visit Number 9    Date for PT Re-Evaluation 02/26/22    Authorization Type [employer]    Authorization Time Period 10/23/21-03/14/2022    Authorization - Visit Number 7    Authorization - Number of Visits 14    PT Start Time 1615    PT Stop Time 1655    PT Time Calculation (min) 40 min    Activity Tolerance Patient tolerated treatment well;No increased pain    Behavior During Therapy [facility] for tasks assessed/performed             Past Medical History:  Diagnosis Date   ASCUS with positive high risk HPV 06/16/2013   Asthma    Cyst 06/07/2012   DVT, lower extremity, proximal (HCC)    Endometriosis     Past Surgical History:  Procedure Laterality Date   laproscopy  2011    There were no vitals filed for this visit.   Subjective Assessment - 01/29/22 1619     Subjective I have felt decent with the treatment. I am stiff on the hips. I have had issues with using the bathroom.    Patient Stated Goals reduce pain    Currently in Pain? Yes    Pain Score 0-No pain                               [facility] Adult PT Treatment/Exercise - 01/29/22 0001       Lumbar Exercises: Stretches   Active Hamstring Stretch Right;Left;1 rep;30 seconds   on the power plate on stretch   Piriformis Stretch Right;Left;1 rep;30 seconds   power stretch on stretch   Other Lumbar Stretch Exercise gastroc and soleus stretch 30 sec  each on power plate      Manual Therapy   Manual Therapy Soft tissue mobilization;Myofascial release    Soft tissue mobilization to the diaphragm,    Myofascial Release release of  the respiratory diaphragm and pelvic diaphragm;release along the left upper quadrant, release the meseteric root, release in the umbilicus, tissue rolling of the abdomen                       PT Short Term Goals - 01/15/22 1712       PT SHORT TERM GOAL #1   Title independent with initial HEP for meditation, stretches, and diaphragmatic breathing to relax the pelvic floor    Time 4    Period Weeks    Status Achieved    Target Date 09/04/21      PT SHORT TERM GOAL #2   Title understand how to perform manual abdominal massage to assist in peristalic motion of the intestines for easier bowel movements    Time 4    Period Weeks    Status Achieved    Target Date 09/04/21      PT SHORT TERM GOAL #3   Title understand different ways to manage pain with down regulating her nervous system    Time 4    Period Weeks    Status Achieved  Target Date 09/04/21               PT Long Term Goals - 01/29/22 1707       PT LONG TERM GOAL #1   Title independent with advanced HEP with pain management, stretches, gentle strengthening and self massage techniques    Baseline HEP is updated as she progresses    Time 4    Period Weeks    Status On-going    Target Date 04/09/22      PT LONG TERM GOAL #2   Title able to allow the therapisr perform internal manual therapy and also have patient perform on herself to reduce trigger points of the pelvic floor to manage her pain    Baseline patient is able to have the therapist perfrom internal work but she has not been educated yet    Time 4    Period Months    Status On-going    Target Date 04/09/22      PT LONG TERM GOAL #3   Title Patient able to toilet correctly and reduce straining by 50% due to elongation of the pelvic floor muscles    Baseline stain to have a bowel movement has improved by 20%    Time 4    Period Months    Status On-going    Target Date 04/09/22      PT LONG TERM GOAL #4   Title pelvic and abdominal  pain at [facility] level </= 4/10 50% of the day due to reduction of trigger points    Baseline pain level 5/10    Time 4    Period Months    Status On-going      PT LONG TERM GOAL #5   Title ability to urinate without double voiding due to relaxation of the pelvic floor muscles during urination    Baseline has to void 2 times and bear down to fully empty her bladder    Time 4    Period Months    Status On-going                   Plan - 01/29/22 1625     Clinical Impression Statement Patient abdomen was very tight and the tissue was not moving well without pain. Patient was having difficulty with constipation and medication was not helping. Patient pelvic floor pain was staying low for several weeks. Patient had increased abdominal tissue mobility and bowel sounds to show the stool is starting to move around. Patient does well with power plate to increased mobility of her hips and reduce the swelling of the abdomen. Patient will benefit from skilled therapy to reduce her pain, improve tissue elongation and mobility.    Personal Factors and Comorbidities Fitness;Comorbidity 1;Sex;Past/Current Experience;Time since onset of injury/illness/exacerbation    Comorbidities endometriosis    Examination-Activity Limitations Locomotion Level;Lift;Stand;Stairs;Sit    Examination-Participation Restrictions Cleaning;Meal Prep;Occupation;Community Activity;Driving;Interpersonal Relationship;Laundry    Stability/Clinical Decision Making Evolving/Moderate complexity    Rehab Potential Good    PT Frequency 1x / week    PT Duration 12 weeks    PT Treatment/Interventions ADLs/Self Care Home Management;Biofeedback;Cryotherapy;Moist Heat;Electrical Stimulation;Therapeutic activities;Therapeutic exercise;Patient/family education;Manual techniques;Dry needling;Spinal Manipulations    PT Next Visit Plan fascial release internally around the urterus, cervix, fallopian tube, bladder; urogenital diaphragm;   use power plate initially, see about toileting with urination and stool    PT Home Exercise Plan Access Code: [email or web address]    Consulted and Agree with Plan of Care Patient  Patient will benefit from skilled therapeutic intervention in order to improve the following deficits and impairments:  Decreased coordination, Increased fascial restricitons, Impaired tone, Decreased endurance, Increased muscle spasms, Decreased activity tolerance, Pain, Decreased strength  Visit Diagnosis: Muscle weakness (generalized)  Pelvic pain  Cramp and spasm     Problem List Patient Active Problem List   Diagnosis Date Noted   Chronic constipation 05/09/2020   History of DVT (deep vein thrombosis) 05/02/2020   Dysplasia of cervix, 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   Low grade squamous intraepith lesion on cytologic smear cervix (lgsil) 11/09/2019   Asthma, chronic 06/22/2013   Migraine, unspecified 06/22/2013   Arthralgia 06/13/2013   Endometriosis 06/13/2013   Edema of both legs 06/13/2013   Weight gain 06/13/2013    Eulis Fosterheryl Balian Schaller, PT 01/29/22 5:09 PM  [facility] [address] [place], [place], [address] Phone: [phone]   Fax:  [phone]  Name: Lori Mays MRN: [id number] Date of Birth: [birth year]

## 2022-02-05 ENCOUNTER — Encounter: Payer: Self-pay | Admitting: Physical Therapy

## 2022-02-12 ENCOUNTER — Other Ambulatory Visit: Payer: Self-pay

## 2022-02-12 ENCOUNTER — Encounter: Payer: Self-pay | Admitting: Physical Therapy

## 2022-02-12 ENCOUNTER — Ambulatory Visit: Payer: BC Managed Care – PPO | Admitting: Physical Therapy

## 2022-02-12 DIAGNOSIS — R252 Cramp and spasm: Secondary | ICD-10-CM | POA: Diagnosis not present

## 2022-02-12 DIAGNOSIS — R102 Pelvic and perineal pain: Secondary | ICD-10-CM

## 2022-02-12 DIAGNOSIS — M6281 Muscle weakness (generalized): Secondary | ICD-10-CM

## 2022-02-12 NOTE — Therapy (Signed)
[facility] [address] [place], [place], [address] Phone: [phone]   Fax:  [phone]  Physical Therapy Treatment  Patient Details  Name: Lori Mays MRN: [id number] Date of Birth: [birth year] Referring Provider (PT): Dr. Carmelina Noun   Encounter Date: 02/12/2022   PT End of Session - 02/12/22 1718     Visit Number 10    Date for PT Re-Evaluation 02/26/22    Authorization Type [employer]    Authorization Time Period 10/23/21-03/14/2022    Authorization - Visit Number 8    Authorization - Number of Visits 14    PT Start Time 1630    PT Stop Time 1708    PT Time Calculation (min) 38 min    Activity Tolerance Patient tolerated treatment well;No increased pain    Behavior During Therapy [facility] for tasks assessed/performed             Past Medical History:  Diagnosis Date   ASCUS with positive high risk HPV 06/16/2013   Asthma    Cyst 06/07/2012   DVT, lower extremity, proximal (HCC)    Endometriosis     Past Surgical History:  Procedure Laterality Date   laproscopy  2011    There were no vitals filed for this visit.   Subjective Assessment - 02/12/22 1635     Subjective I did a hike for 2 miles and felt alright. I have swelling in the abdomen. I have a pinch feeling in the back.    Patient Stated Goals reduce pain    Currently in Pain? Yes    Pain Score 8     Pain Location Back    Pain Orientation Left    Pain Descriptors / Indicators Discomfort    Pain Type Acute pain    Pain Onset More than a month ago    Pain Frequency Intermittent    Aggravating Factors  movement    Pain Relieving Factors rest    Multiple Pain Sites No                               [facility] Adult PT Treatment/Exercise - 02/12/22 0001       Self-Care   Self-Care Other Self-Care Comments    Other Self-Care Comments  educated patient on how to reduce abdominal bloating with lymph drainage and abdominal  massage      Therapeutic Activites    Therapeutic Activities Other Therapeutic Activities    Other Therapeutic Activities education on how to double void, sit on commode to relax the pelvic floor and use low sounds to relax the pelvic floor      Manual Therapy   Manual Therapy Soft tissue mobilization;Joint mobilization    Joint Mobilization gapping of the left side of L1-L5    Soft tissue mobilization soft tissue work to left quadratus, obliques, and lumbar paraspinals                     PT Education - 02/12/22 1708     Education Details educated patient on reducing abdominal bloating and double voiding.    Person(s) Educated Patient    Methods Explanation;Demonstration;Handout    Comprehension Verbalized understanding;Returned demonstration              PT Short Term Goals - 01/15/22 1712       PT SHORT TERM GOAL #1   Title independent with initial HEP for  meditation, stretches, and diaphragmatic breathing to relax the pelvic floor    Time 4    Period Weeks    Status Achieved    Target Date 09/04/21      PT SHORT TERM GOAL #2   Title understand how to perform manual abdominal massage to assist in peristalic motion of the intestines for easier bowel movements    Time 4    Period Weeks    Status Achieved    Target Date 09/04/21      PT SHORT TERM GOAL #3   Title understand different ways to manage pain with down regulating her nervous system    Time 4    Period Weeks    Status Achieved    Target Date 09/04/21               PT Long Term Goals - 01/29/22 1707       PT LONG TERM GOAL #1   Title independent with advanced HEP with pain management, stretches, gentle strengthening and self massage techniques    Baseline HEP is updated as she progresses    Time 4    Period Weeks    Status On-going    Target Date 04/09/22      PT LONG TERM GOAL #2   Title able to allow the therapisr perform internal manual therapy and also have patient perform  on herself to reduce trigger points of the pelvic floor to manage her pain    Baseline patient is able to have the therapist perfrom internal work but she has not been educated yet    Time 4    Period Months    Status On-going    Target Date 04/09/22      PT LONG TERM GOAL #3   Title Patient able to toilet correctly and reduce straining by 50% due to elongation of the pelvic floor muscles    Baseline stain to have a bowel movement has improved by 20%    Time 4    Period Months    Status On-going    Target Date 04/09/22      PT LONG TERM GOAL #4   Title pelvic and abdominal pain at [facility] level </= 4/10 50% of the day due to reduction of trigger points    Baseline pain level 5/10    Time 4    Period Months    Status On-going      PT LONG TERM GOAL #5   Title ability to urinate without double voiding due to relaxation of the pelvic floor muscles during urination    Baseline has to void 2 times and bear down to fully empty her bladder    Time 4    Period Months    Status On-going                   Plan - 02/12/22 1714     Clinical Impression Statement Patient was able to go on a 2 mile hike without increased pain in the abdomen. She is going off her birth control to store her eggs and is concerned with the pain that comee with that. She is having increased bloating of the abdomen so she has learned how to reduce the bloating. She has difficulty with emptying her bladder and was taught how to double void and relax the pelvic floor with urination. Patient will benefit from skilled therapy to reduce her pain, improve tissue elongation, and mobility.    Personal Factors and  Comorbidities Fitness;Comorbidity 1;Sex;Past/Current Experience;Time since onset of injury/illness/exacerbation    Comorbidities endometriosis    Examination-Activity Limitations Locomotion Level;Lift;Stand;Stairs;Sit    Examination-Participation Restrictions Cleaning;Meal Prep;Occupation;Community  Activity;Driving;Interpersonal Relationship;Laundry    Stability/Clinical Decision Making Evolving/Moderate complexity    Rehab Potential Good    PT Frequency 1x / week    PT Duration 12 weeks    PT Treatment/Interventions ADLs/Self Care Home Management;Biofeedback;Cryotherapy;Moist Heat;Electrical Stimulation;Therapeutic activities;Therapeutic exercise;Patient/family education;Manual techniques;Dry needling;Spinal Manipulations    PT Next Visit Plan fascial release internally around the urterus, cervix, fallopian tube, bladder; urogenital diaphragm;  use power plate initially, see about toileting with urination and stool; write renewal for MD    PT Home Exercise Plan Access Code: [email or web address]    Consulted and Agree with Plan of Care Patient             Patient will benefit from skilled therapeutic intervention in order to improve the following deficits and impairments:  Decreased coordination, Increased fascial restricitons, Impaired tone, Decreased endurance, Increased muscle spasms, Decreased activity tolerance, Pain, Decreased strength  Visit Diagnosis: Muscle weakness (generalized)  Pelvic pain  Cramp and spasm     Problem List Patient Active Problem List   Diagnosis Date Noted   Chronic constipation 05/09/2020   History of DVT (deep vein thrombosis) 05/02/2020   Dysplasia of cervix, 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   Low grade squamous intraepith lesion on cytologic smear cervix (lgsil) 11/09/2019   Asthma, chronic 06/22/2013   Migraine, unspecified 06/22/2013   Arthralgia 06/13/2013   Endometriosis 06/13/2013   Edema of both legs 06/13/2013   Weight gain 06/13/2013    Eulis Foster, PT 02/12/22 5:20 PM  [facility] [address] [place], [place], [address] Phone: [phone]   Fax:  [phone]  Name: Lori Mays MRN: [id number] Date of Birth: [birth year]

## 2022-02-12 NOTE — Patient Instructions (Addendum)
Ways to reduce bloating of the stomach  Lay on back with feet on a couch. Hips at 90/90. Place a pillow und hips. Stay for 10 - 15 minutes.   2. Abdominal circular massage. Making diamonds on stomach.   3. On lower stomach make L. Go across and down the abdomen 10x each side. Can do in sitting too.   Double Voiding can be a very useful technique to help overcome incomplete emptying of your bladder.  Incomplete emptying of urine can result in leakage after using the bathroom and increase the risk of urinary tract infection.   Initial Void: When you first sit down to urinate, ensure optimal positioning for bladder emptying by following these guidelines for toileting posture: Sit on the toilet seat - dont hover over the seat Support your trunk by placing your hands on your knees or thighs Spread your knees and hips wide Position your feet flat on the floor or elevate feet on phone books, foot stool (Squatty Potty), or wrapped toilet paper rolls (if having knees above hips helps you empty) Lean forward from your hips Maintain the normal inward curve in your lower back   Repeated Void: After your initial void is complete, follow these movement patterns and attempt going to the bathroom again. Stand up Rotate your hips as if doing hula hoop in one direction Rotate using the same action in the other direction Rock your hips and pelvis back and forwards ("pelvic tilts") Rock your hips and pelvis side to side ("tail wag") Sit back down and repeat your voiding technique This technique can be repeated as many times as you choose to help you empty your bladder more effectively.  [facility] [address], [address] [place], [place] [address] Phone # [phone] Fax [phone]

## 2022-02-19 ENCOUNTER — Encounter: Payer: Self-pay | Admitting: Physical Therapy

## 2022-02-19 ENCOUNTER — Ambulatory Visit: Payer: BC Managed Care – PPO | Admitting: Physical Therapy

## 2022-02-19 ENCOUNTER — Other Ambulatory Visit: Payer: Self-pay

## 2022-02-19 DIAGNOSIS — R102 Pelvic and perineal pain: Secondary | ICD-10-CM | POA: Diagnosis not present

## 2022-02-19 DIAGNOSIS — M6281 Muscle weakness (generalized): Secondary | ICD-10-CM | POA: Diagnosis not present

## 2022-02-19 DIAGNOSIS — R252 Cramp and spasm: Secondary | ICD-10-CM

## 2022-02-19 NOTE — Therapy (Signed)
[facility] [address] [place], [place], [address] Phone: [phone]   Fax:  [phone]  Physical Therapy Treatment  Patient Details  Name: Lori Mays MRN: [id number] Date of Birth: [birth year] Referring Provider (PT): Dr. Carmelina Noun   Encounter Date: 02/19/2022   PT End of Session - 02/19/22 1617     Visit Number 11    Date for PT Re-Evaluation 02/26/22    Authorization Type [employer]    Authorization Time Period 10/23/21-03/14/2022    Authorization - Visit Number 9    Authorization - Number of Visits 14    PT Start Time 1615    PT Stop Time 1653    PT Time Calculation (min) 38 min    Activity Tolerance Patient tolerated treatment well;No increased pain    Behavior During Therapy [facility] for tasks assessed/performed             Past Medical History:  Diagnosis Date   ASCUS with positive high risk HPV 06/16/2013   Asthma    Cyst 06/07/2012   DVT, lower extremity, proximal (HCC)    Endometriosis     Past Surgical History:  Procedure Laterality Date   laproscopy  2011    There were no vitals filed for this visit.   Subjective Assessment - 02/19/22 1618     Subjective I have been feeling better. I have had some abdominal pain. I have slight left hip pain. I am not on the Belt.    Patient Stated Goals reduce pain    Currently in Pain? Yes    Pain Score 4     Pain Location Abdomen    Pain Orientation Anterior    Pain Descriptors / Indicators Cramping    Pain Type Acute pain    Pain Onset More than a month ago    Pain Frequency Intermittent    Aggravating Factors  random    Pain Relieving Factors massage the abdomen, eating    Multiple Pain Sites No                            Pelvic Floor Special Questions - 02/19/22 0001     Pelvic Floor Internal Exam Patient confirms identification and approve PT to assess pelvic floor and treatment    Exam Type Vaginal                [facility] Adult PT Treatment/Exercise - 02/19/22 0001       Manual Therapy   Manual Therapy Myofascial release;Internal Pelvic Floor    Myofascial Release release of the urogenital diaphragm going through the areas of restrictions; fascial work along the labia minora    Internal Pelvic Floor manual work along the introitus, release of the cervic ant. left side and post. , relesae of the sides of the urethra and bladder                       PT Short Term Goals - 01/15/22 1712       PT SHORT TERM GOAL #1   Title independent with initial HEP for meditation, stretches, and diaphragmatic breathing to relax the pelvic floor    Time 4    Period Weeks    Status Achieved    Target Date 09/04/21      PT SHORT TERM GOAL #2   Title understand how to perform manual abdominal massage to assist in peristalic motion  of the intestines for easier bowel movements    Time 4    Period Weeks    Status Achieved    Target Date 09/04/21      PT SHORT TERM GOAL #3   Title understand different ways to manage pain with down regulating her nervous system    Time 4    Period Weeks    Status Achieved    Target Date 09/04/21               PT Long Term Goals - 01/29/22 1707       PT LONG TERM GOAL #1   Title independent with advanced HEP with pain management, stretches, gentle strengthening and self massage techniques    Baseline HEP is updated as she progresses    Time 4    Period Weeks    Status On-going    Target Date 04/09/22      PT LONG TERM GOAL #2   Title able to allow the therapisr perform internal manual therapy and also have patient perform on herself to reduce trigger points of the pelvic floor to manage her pain    Baseline patient is able to have the therapist perfrom internal work but she has not been educated yet    Time 4    Period Months    Status On-going    Target Date 04/09/22      PT LONG TERM GOAL #3   Title Patient able to toilet correctly and  reduce straining by 50% due to elongation of the pelvic floor muscles    Baseline stain to have a bowel movement has improved by 20%    Time 4    Period Months    Status On-going    Target Date 04/09/22      PT LONG TERM GOAL #4   Title pelvic and abdominal pain at [facility] level </= 4/10 50% of the day due to reduction of trigger points    Baseline pain level 5/10    Time 4    Period Months    Status On-going      PT LONG TERM GOAL #5   Title ability to urinate without double voiding due to relaxation of the pelvic floor muscles during urination    Baseline has to void 2 times and bear down to fully empty her bladder    Time 4    Period Months    Status On-going                   Plan - 02/19/22 2125     Clinical Impression Statement Patient has restrictions and decreased mobility around the cervix. She had restrictions on the sides of the bladder and through the urogenital diaphragm. After manual work she reported reduction of pain and the feeling of things being loser in the area. She is able to empty her bladder more times now. Patient is not having as much overall pain. Patient will benefit from skilled therapy to reduce her pain, improve tissue elongation, and mobility.    Personal Factors and Comorbidities Fitness;Comorbidity 1;Sex;Past/Current Experience;Time since onset of injury/illness/exacerbation    Comorbidities endometriosis    Examination-Activity Limitations Locomotion Level;Lift;Stand;Stairs;Sit    Examination-Participation Restrictions Cleaning;Meal Prep;Occupation;Community Activity;Driving;Interpersonal Relationship;Laundry    Rehab Potential Good    PT Frequency 1x / week    PT Duration 12 weeks    PT Treatment/Interventions ADLs/Self Care Home Management;Biofeedback;Cryotherapy;Moist Heat;Electrical Stimulation;Therapeutic activities;Therapeutic exercise;Patient/family education;Manual techniques;Dry needling;Spinal Manipulations    PT Next  Visit  Plan fascial release internally around the urterus, cervix, fallopian tube, bladder; urogenital diaphragm; write renewal for MD    PT Home Exercise Plan Access Code: [email or web address]    Consulted and Agree with Plan of Care Patient             Patient will benefit from skilled therapeutic intervention in order to improve the following deficits and impairments:     Visit Diagnosis: Muscle weakness (generalized)  Pelvic pain  Cramp and spasm     Problem List Patient Active Problem List   Diagnosis Date Noted   Chronic constipation 05/09/2020   History of DVT (deep vein thrombosis) 05/02/2020   Dysplasia of cervix, 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   Low grade squamous intraepith lesion on cytologic smear cervix (lgsil) 11/09/2019   Asthma, chronic 06/22/2013   Migraine, unspecified 06/22/2013   Arthralgia 06/13/2013   Endometriosis 06/13/2013   Edema of both legs 06/13/2013   Weight gain 06/13/2013    Eulis Foster, PT 02/19/22 9:38 PM  [facility] [address] [place], [place], [address] Phone: [phone]   Fax:  [phone]  Name: Lori Mays MRN: [id number] Date of Birth: [birth year]

## 2022-02-26 ENCOUNTER — Ambulatory Visit: Payer: BC Managed Care – PPO | Attending: Student | Admitting: Physical Therapy

## 2022-02-26 ENCOUNTER — Other Ambulatory Visit: Payer: Self-pay

## 2022-02-26 ENCOUNTER — Encounter: Payer: Self-pay | Admitting: Physical Therapy

## 2022-02-26 DIAGNOSIS — R102 Pelvic and perineal pain: Secondary | ICD-10-CM | POA: Diagnosis not present

## 2022-02-26 DIAGNOSIS — M6281 Muscle weakness (generalized): Secondary | ICD-10-CM | POA: Diagnosis not present

## 2022-02-26 DIAGNOSIS — R252 Cramp and spasm: Secondary | ICD-10-CM | POA: Insufficient documentation

## 2022-02-26 NOTE — Therapy (Signed)
[facility] [address] [place], [place], [address] Phone: [phone]   Fax:  [phone]  Physical Therapy Treatment  Patient Details  Name: Lori Mays MRN: [id number] Date of Birth: 16-Nov-1986 Referring Provider (PT): Dr. Carmelina Noun   Encounter Date: 02/26/2022   PT End of Session - 02/26/22 1721     Visit Number 12    Date for PT Re-Evaluation 06/28/22    Authorization Type [employer]    Authorization Time Period 10/23/21-03/14/2022    Authorization - Visit Number 10    Authorization - Number of Visits 14    PT Start Time 1145    PT Stop Time 1225    PT Time Calculation (min) 40 min    Activity Tolerance Patient tolerated treatment well;Patient limited by pain    Behavior During Therapy [facility] for tasks assessed/performed             Past Medical History:  Diagnosis Date   ASCUS with positive high risk HPV 06/16/2013   Asthma    Cyst 06/07/2012   DVT, lower extremity, proximal (HCC)    Endometriosis     Past Surgical History:  Procedure Laterality Date   laproscopy  2011    There were no vitals filed for this visit.   Subjective Assessment - 02/26/22 1156     Subjective I took off of work today due to the ovary pain. My pain started last night.    Patient Stated Goals reduce pain    Currently in Pain? Yes    Pain Score 4    was 8/10 earlier today   Pain Location Abdomen    Pain Orientation Right;Posterior;Anterior;Lower    Pain Descriptors / Indicators Burning;Cramping    Pain Type Acute pain    Pain Onset In the past 7 days    Pain Frequency Constant    Aggravating Factors  ovary pain    Pain Relieving Factors mediation    Multiple Pain Sites No                [facility] PT Assessment - 02/26/22 0001       Assessment   Medical Diagnosis M79.18 Myalgia of pelvic floor    Referring Provider (PT) Dr. Carmelina Noun    Prior Therapy none      Precautions   Precautions None       Restrictions   Weight Bearing Restrictions No      Home Environment   Living Environment Private residence      Prior Function   Level of Independence Independent    Vocation Full time employment    Vocation Requirements sitting at computer    Leisure no exercise      Cognition   Overall Cognitive Status Within Functional Limits for tasks assessed      Posture/Postural Control   Posture/Postural Control No significant limitations      AROM   Overall AROM Comments lumbar ROM is full      Strength   Right Hip Flexion 4/5    Right Hip Extension 4/5    Right Hip External Rotation  4/5    Right Hip Internal Rotation 4/5    Right Hip ABduction 4/5    Right Hip ADduction 4/5    Left Hip Flexion 4/5    Left Hip Extension 4/5    Left Hip External Rotation 4/5    Left Hip Internal Rotation 4/5    Left Hip ABduction 4/5  Left Hip ADduction 4/5                        Pelvic Floor Special Questions - 02/26/22 0001     Currently Sexually Active Yes    Is this Painful Yes    Other activities that cause leaking after urinating will have to urinate again and has to push to urinate at the end    Urinary frequency urinate every 3-4 hours    Fecal incontinence Yes   severe constipation, on linzess, strain   External Palpation negative Q-tip test    Pelvic Floor Internal Exam Patient confirms identification and approve PT to assess pelvic floor and treatment    Exam Type Vaginal    Strength weak squeeze, no lift   trouble relaxing the pelvic floor after contraction              [facility] Adult PT Treatment/Exercise - 02/26/22 0001       Manual Therapy   Manual Therapy Internal Pelvic Floor;Soft tissue mobilization    Manual therapy comments patient had heat on her back and abdomen during treatment due to her pain    Soft tissue mobilization manual work to the right quadricep, hamstring, hip adductor, and right lateral trunk    Internal Pelvic Floor manual work to  the right levator ani and obturator internist using desert harvest reveleum and monitoring for pain                       PT Short Term Goals - 02/26/22 1719       PT SHORT TERM GOAL #1   Title independent with initial HEP for meditation, stretches, and diaphragmatic breathing to relax the pelvic floor    Baseline --    Time 4    Period Weeks    Status Achieved    Target Date 09/04/21      PT SHORT TERM GOAL #2   Title understand how to perform manual abdominal massage to assist in peristalic motion of the intestines for easier bowel movements    Baseline --    Time 4    Period Weeks    Status Achieved    Target Date 09/04/21      PT SHORT TERM GOAL #3   Title understand different ways to manage pain with down regulating her nervous system    Baseline --    Time 4    Period Weeks    Status Achieved               PT Long Term Goals - 02/26/22 1720       PT LONG TERM GOAL #1   Title independent with advanced HEP with pain management, stretches, gentle strengthening and self massage techniques    Baseline HEP is updated as she progresses    Time 4    Period Weeks    Status On-going    Target Date 04/09/22      PT LONG TERM GOAL #2   Title able to allow the therapisr perform internal manual therapy and also have patient perform on herself to reduce trigger points of the pelvic floor to manage her pain    Baseline patient is able to have the therapist perfrom internal work but she has not been educated yet. She is looking into a vaginal wand    Time 4    Period Months    Status On-going    Target  Date 04/09/22      PT LONG TERM GOAL #3   Title Patient able to toilet correctly and reduce straining by 50% due to elongation of the pelvic floor muscles    Baseline stain to have a bowel movement has improved by 20%    Time 4    Period Months    Status On-going      PT LONG TERM GOAL #4   Title pelvic and abdominal pain at [facility] level </= 4/10 50%  of the day due to reduction of trigger points    Baseline pain level 5/10    Time 4    Period Months    Status On-going    Target Date 04/09/22      PT LONG TERM GOAL #5   Title ability to urinate without double voiding due to relaxation of the pelvic floor muscles during urination    Baseline has to void 2 times and bear down to fully empty her bladder    Time 4    Period Months    Status On-going                   Plan - 02/26/22 1712     Clinical Impression Statement Patient is having increased pain due to her starting to have a cycle return. The pain is the first time she has had this since treatment. She was able to tolerate internal work with this pain and had some trigger points located in the obturator internist and levator ani. She has restrictions along the bladder and urogenital diaphragm. Her pelvic floor strength is 2/5. Prior to this pain she was able to hike for the first time. She was able to empty her bladder more ofter prior to this increase in pain. She continues to strain to have a bowel movement. Patient is understanding ways to manage her pain. She is going to look into a vaginal wand to perform her own internal manual work. Patient will benefit from skilled therapy to reduce her pain, improve tissue elongation, and mobility.    Personal Factors and Comorbidities Fitness;Comorbidity 1;Sex;Past/Current Experience;Time since onset of injury/illness/exacerbation    Comorbidities endometriosis    Examination-Activity Limitations Locomotion Level;Lift;Stand;Stairs;Sit    Examination-Participation Restrictions Cleaning;Meal Prep;Occupation;Community Activity;Driving;Interpersonal Relationship;Laundry    Stability/Clinical Decision Making Evolving/Moderate complexity    Rehab Potential Good    PT Frequency 1x / week    PT Duration 12 weeks    PT Treatment/Interventions ADLs/Self Care Home Management;Biofeedback;Cryotherapy;Moist Heat;Electrical  Stimulation;Therapeutic activities;Therapeutic exercise;Patient/family education;Manual techniques;Dry needling;Spinal Manipulations    PT Next Visit Plan fascial release internally around the urterus, cervix, fallopian tube, bladder; urogenital diaphragm; see how the ovarian pain is    PT Home Exercise Plan Access Code: [email or web address]    Recommended Other Services sent MD renewal    Consulted and Agree with Plan of Care Patient             Patient will benefit from skilled therapeutic intervention in order to improve the following deficits and impairments:  Decreased coordination, Increased fascial restricitons, Impaired tone, Decreased endurance, Increased muscle spasms, Decreased activity tolerance, Pain, Decreased strength  Visit Diagnosis: Muscle weakness (generalized)  Pelvic pain  Cramp and spasm     Problem List Patient Active Problem List   Diagnosis Date Noted   Chronic constipation 05/09/2020   History of DVT (deep vein thrombosis) 05/02/2020   Dysplasia of cervix, 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   Low grade squamous intraepith lesion on cytologic smear cervix (  lgsil) 11/09/2019   Asthma, chronic 06/22/2013   Migraine, unspecified 06/22/2013   Arthralgia 06/13/2013   Endometriosis 06/13/2013   Edema of both legs 06/13/2013   Weight gain 06/13/2013    Earlie Counts, PT 02/26/22 5:22 PM  [facility] [place], [place], [address] Phone: [phone]   Fax:  [phone]  Name: Lori Mays MRN: [id number] Date of Birth: 1986/02/20

## 2022-03-05 ENCOUNTER — Other Ambulatory Visit: Payer: Self-pay

## 2022-03-05 ENCOUNTER — Ambulatory Visit: Payer: BC Managed Care – PPO | Admitting: Physical Therapy

## 2022-03-05 DIAGNOSIS — M6281 Muscle weakness (generalized): Secondary | ICD-10-CM

## 2022-03-05 DIAGNOSIS — R102 Pelvic and perineal pain: Secondary | ICD-10-CM

## 2022-03-05 DIAGNOSIS — R252 Cramp and spasm: Secondary | ICD-10-CM | POA: Diagnosis not present

## 2022-03-05 NOTE — Therapy (Signed)
[facility] [address] [place], [place], [address] Phone: [phone]   Fax:  [phone]  Physical Therapy Treatment  Patient Details  Name: Lori Mays MRN: [id number] Date of Birth: 03-20-1986 Referring Provider (PT): Dr. Carmelina Noun   Encounter Date: 03/05/2022   PT End of Session - 03/05/22 1723     Visit Number 13    Date for PT Re-Evaluation 06/28/22    Authorization Type [employer]    Authorization Time Period 10/23/21-03/14/2022    Authorization - Visit Number 11    Authorization - Number of Visits 14    PT Start Time 1615    PT Stop Time 1700    PT Time Calculation (min) 45 min    Activity Tolerance Patient tolerated treatment well;Patient limited by pain    Behavior During Therapy [facility] for tasks assessed/performed             Past Medical History:  Diagnosis Date   ASCUS with positive high risk HPV 06/16/2013   Asthma    Cyst 06/07/2012   DVT, lower extremity, proximal (HCC)    Endometriosis     Past Surgical History:  Procedure Laterality Date   laproscopy  2011    There were no vitals filed for this visit.   Subjective Assessment - 03/05/22 1627     Subjective I am feeling better from last visit. I still have the cyst leaking and the pain. I have kept up with the meds.    Patient Stated Goals reduce pain    Currently in Pain? Yes    Pain Score 2     Pain Location Abdomen    Pain Orientation Right;Anterior;Posterior;Lower    Pain Descriptors / Indicators Burning;Cramping    Pain Type Acute pain    Pain Onset In the past 7 days    Pain Frequency Constant    Aggravating Factors  ovary pain    Pain Relieving Factors medication    Multiple Pain Sites No                [facility] PT Assessment - 03/05/22 0001       Assessment   Medical Diagnosis M79.18 Myalgia of pelvic floor    Referring Provider (PT) Dr. Carmelina Noun    Prior Therapy none      Precautions   Precautions  None      Home Environment   Living Environment Private residence      Prior Function   Level of Independence Independent    Vocation Full time employment    Vocation Requirements sitting at computer    Leisure no exercise      Cognition   Overall Cognitive Status Within Functional Limits for tasks assessed      AROM   Overall AROM Comments lumbar ROM is full      Strength   Right Hip Flexion 4/5    Right Hip Extension 4/5    Right Hip External Rotation  4/5    Right Hip Internal Rotation 4/5    Right Hip ABduction 4/5    Right Hip ADduction 4/5    Left Hip Flexion 4/5    Left Hip Extension 4/5    Left Hip External Rotation 4/5    Left Hip Internal Rotation 4/5    Left Hip ABduction 4/5    Left Hip ADduction 4/5  Pelvic Floor Special Questions - 03/05/22 0001     Currently Sexually Active Yes    Is this Painful Yes    Other activities that cause leaking after urinating will have to urinate again and has to push to urinate at the end    Urinary frequency urinate every 3-4 hours    Fecal incontinence Yes   severe constipation, on linzess, strain   External Palpation negative q_tip test    Pelvic Floor Internal Exam Patient confirms identification and approve PT to assess pelvic floor and treatment    Exam Type Vaginal    Palpation cervix is firm and decreased mobility, decreased movement of the falllopian tubes and ovary    Strength weak squeeze, no lift   trouble relaxing the pelvic floor after contraction              [facility] Adult PT Treatment/Exercise - 03/05/22 0001       Manual Therapy   Manual Therapy Myofascial release;Internal Pelvic Floor    Myofascial Release myofascial release of the urogenital diaphragm    Internal Pelvic Floor sidely with therapist having one finger internally and other along the anterior lower abdominal area-working on movilizaitonof the cervix, fallopian tub, ovary, levator ani, and just above  the bladder while monitoring for pain                       PT Short Term Goals - 03/05/22 1717       PT SHORT TERM GOAL #1   Title independent with initial HEP for meditation, stretches, and diaphragmatic breathing to relax the pelvic floor    Time 4    Period Weeks    Status Achieved    Target Date 09/04/21      PT SHORT TERM GOAL #2   Title understand how to perform manual abdominal massage to assist in peristalic motion of the intestines for easier bowel movements    Time 4    Period Weeks    Status Achieved    Target Date 09/04/21      PT SHORT TERM GOAL #3   Title understand different ways to manage pain with down regulating her nervous system    Time 4    Period Weeks    Status Achieved               PT Long Term Goals - 03/05/22 1717       PT LONG TERM GOAL #1   Title independent with advanced HEP with pain management, stretches, gentle strengthening and self massage techniques    Baseline HEP is updated as she progresses    Time 4    Period Weeks    Status On-going    Target Date 04/09/22      PT LONG TERM GOAL #2   Title able to allow the therapisr perform internal manual therapy and also have patient perform on herself to reduce trigger points of the pelvic floor to manage her pain    Baseline patient is able to have the therapist perfrom internal work but she has not been educated yet. She is looking into a vaginal wand    Time 4    Period Months    Status On-going      PT LONG TERM GOAL #3   Title Patient able to toilet correctly and reduce straining by 50% due to elongation of the pelvic floor muscles    Baseline stain to have a bowel movement has  improved by 20%    Time 4    Period Months    Status On-going    Target Date 04/09/22      PT LONG TERM GOAL #4   Title pelvic and abdominal pain at [facility] level </= 4/10 50% of the day due to reduction of trigger points    Baseline pain level 5/10, right now she is not on birth  control so she is going through the hormonal shifts that cause pelvic pain    Time 4    Period Months    Status On-going      PT LONG TERM GOAL #5   Title ability to urinate without double voiding due to relaxation of the pelvic floor muscles during urination    Baseline has to void 2 times and bear down to fully empty her bladder    Time 4    Period Months    Status On-going                   Plan - 03/05/22 1719     Clinical Impression Statement Patient has had increased pain due to not being on birth control now and going through the hormonal changes. She is not able to tolerate internal work and has a negative Q-tip test. She has trigger points in the obturator internist, levator ani, sides of the bladder. She has decreased mobility of the cervix, fallopian tube, and ovaries. Her cervix is very hard. She has restrictions along the bladder and urogenital diaphragm. Her pelvic floor strength is 2/5. Bilateral hip strength is 4/5. She is having difficulty with emptying her bladder due to tightening of the muscles with increased pelvic floor pain. Patient is looking into vaginal wand to perform her own internal manual work. Patient will benefit from skilled therapy to reduce her pain, improve tissue elongation, and mobility.    Personal Factors and Comorbidities Fitness;Comorbidity 1;Sex;Past/Current Experience;Time since onset of injury/illness/exacerbation    Comorbidities endometriosis    Examination-Activity Limitations Locomotion Level;Lift;Stand;Stairs;Sit    Examination-Participation Restrictions Cleaning;Meal Prep;Occupation;Community Activity;Driving;Interpersonal Relationship;Laundry    Stability/Clinical Decision Making Evolving/Moderate complexity    Rehab Potential Good    PT Frequency 1x / week    PT Duration 12 weeks    PT Treatment/Interventions ADLs/Self Care Home Management;Biofeedback;Cryotherapy;Moist Heat;Electrical Stimulation;Therapeutic activities;Therapeutic  exercise;Patient/family education;Manual techniques;Dry needling;Spinal Manipulations    PT Next Visit Plan fascial release internally around the urterus, cervix, fallopian tube, bladder; urogenital diaphragm; see how the ovarian pain is    PT Home Exercise Plan Access Code: [email or web address]    Consulted and Agree with Plan of Care Patient             Patient will benefit from skilled therapeutic intervention in order to improve the following deficits and impairments:  Decreased coordination, Increased fascial restricitons, Impaired tone, Decreased endurance, Increased muscle spasms, Decreased activity tolerance, Pain, Decreased strength  Visit Diagnosis: Muscle weakness (generalized)  Pelvic pain     Problem List Patient Active Problem List   Diagnosis Date Noted   Chronic constipation 05/09/2020   History of DVT (deep vein thrombosis) 05/02/2020   Dysplasia of cervix, 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   Low grade squamous intraepith lesion on cytologic smear cervix (lgsil) 11/09/2019   Asthma, chronic 06/22/2013   Migraine, unspecified 06/22/2013   Arthralgia 06/13/2013   Endometriosis 06/13/2013   Edema of both legs 06/13/2013   Weight gain 06/13/2013    Eulis Foster, PT 03/05/22 5:25 PM  [facility] [address] [place], [place], [address] Phone: [phone]   Fax:  [phone]  Name: Samah Lapiana MRN: [id number] Date of Birth: January 30, 1986

## 2022-03-19 ENCOUNTER — Ambulatory Visit: Payer: BC Managed Care – PPO | Admitting: Physical Therapy

## 2022-03-19 ENCOUNTER — Other Ambulatory Visit: Payer: Self-pay

## 2022-03-19 ENCOUNTER — Encounter: Payer: Self-pay | Admitting: Physical Therapy

## 2022-03-19 DIAGNOSIS — M6281 Muscle weakness (generalized): Secondary | ICD-10-CM

## 2022-03-19 DIAGNOSIS — R252 Cramp and spasm: Secondary | ICD-10-CM

## 2022-03-19 DIAGNOSIS — R102 Pelvic and perineal pain: Secondary | ICD-10-CM

## 2022-03-19 NOTE — Therapy (Signed)
[place] ?[facility] ?[address] ?[place], [place], [address] ?Phone: [phone]   Fax:  [phone] ? ?Physical Therapy Treatment ? ?Patient Details  ?Name: Lori Mays ?MRN: [id number] ?Date of Birth: [birth year] ?Referring Provider (PT): Dr. Carmelina NounJanelle Moulder ? ? ?Encounter Date: 03/19/2022 ? ? PT End of Session - 03/19/22 1709   ? ? Visit Number 14   ? Date for PT Re-Evaluation 06/28/22   ? Authorization Type [employer] Medicaid   ? Authorization Time Period 3/22-4/11   ? Authorization - Visit Number 1   ? Authorization - Number of Visits 3   ? PT Start Time 1630   came late  ? PT Stop Time 1700   ? PT Time Calculation (min) 30 min   ? Activity Tolerance Patient tolerated treatment well   ? [facility] assessed/performed   ? ?  ?  ? ?  ? ? ?Past Medical History:  ?Diagnosis Date  ? ASCUS with positive high risk HPV 06/16/2013  ? Asthma   ? Cyst 06/07/2012  ? DVT, lower extremity, proximal (HCC)   ? Endometriosis   ? ? ?Past Surgical History:  ?Procedure Laterality Date  ? laproscopy  2011  ? ? ?There were no vitals filed for this visit. ? ? Subjective Assessment - 03/19/22 1627   ? ? Subjective I had a massage and helped. I saw the chiropractor and wants me to drink more water. I felt better after last visit and the pain level was down.   ? Patient Stated Goals reduce pain   ? Currently in Pain? Yes   ? Pain Score 1    ? Pain Location Abdomen   ? Pain Orientation Right;Anterior;Posterior;Lower   ? Pain Descriptors / Indicators Burning;Cramping   ? Pain Type Acute pain   ? Pain Onset In the past 7 days   ? Pain Frequency Constant   ? Aggravating Factors  ovary pain   ? Pain Relieving Factors medication   ? Multiple Pain Sites No   ? ?  ?  ? ?  ? ? ? ? ? ? ? ? ? ? ? ? ? ? ? ? ? Pelvic Floor Special Questions - 03/19/22 0001   ? ? Pelvic Floor Internal Exam Patient confirms identification and approve PT to assess pelvic floor and treatment   ?  Exam Type Vaginal   ? ?  ?  ? ?  ? ? ? ? [facility] Adult PT Treatment/Exercise - 03/19/22 0001   ? ?  ? Manual Therapy  ? Manual Therapy Internal Pelvic Floor;Myofascial release   ? Manual therapy comments patient had heat on her back and abdomen during treatment due to her pain   ? Myofascial Release release around the left lower quadrant working on the mesenteric root and lower intestines   ? Internal Pelvic Floor supine manual work around the cervix and posterior vaginal canal to release the tissue   ? ?  ?  ? ?  ? ? ? ? ? ? ? ? ? ? ? ? PT Short Term Goals - 03/05/22 1717   ? ?  ? PT SHORT TERM GOAL #1  ? Title independent with initial HEP for meditation, stretches, and diaphragmatic breathing to relax the pelvic floor   ? Time 4   ? Period Weeks   ? Status Achieved   ? Target Date 09/04/21   ?  ? PT SHORT TERM GOAL #2  ?  Title understand how to perform manual abdominal massage to assist in peristalic motion of the intestines for easier bowel movements   ? Time 4   ? Period Weeks   ? Status Achieved   ? Target Date 09/04/21   ?  ? PT SHORT TERM GOAL #3  ? Title understand different ways to manage pain with down regulating her nervous system   ? Time 4   ? Period Weeks   ? Status Achieved   ? ?  ?  ? ?  ? ? ? ? PT Long Term Goals - 03/19/22 1709   ? ?  ? PT LONG TERM GOAL #1  ? Title independent with advanced HEP with pain management, stretches, gentle strengthening and self massage techniques   ? Baseline HEP is updated as she progresses   ? Time 4   ? Period Weeks   ? Status On-going   ? Target Date 04/09/22   ?  ? PT LONG TERM GOAL #2  ? Title able to allow the therapisr perform internal manual therapy and also have patient perform on herself to reduce trigger points of the pelvic floor to manage her pain   ? Baseline patient is able to have the therapist perfrom internal work but she has not been educated yet. She is looking into a vaginal wand   ? Time 4   ? Period Months   ? Status On-going   ? Target Date  04/09/22   ?  ? PT LONG TERM GOAL #3  ? Title Patient able to toilet correctly and reduce straining by 50% due to elongation of the pelvic floor muscles   ? Baseline stain to have a bowel movement has improved by 20%   ? Time 4   ? Period Months   ? Status On-going   ? Target Date 04/09/22   ?  ? PT LONG TERM GOAL #4  ? Title pelvic and abdominal pain at [facility] level </= 4/10 50% of the day due to reduction of trigger points   ? Baseline pain level 5/10, right now she is not on birth control so she is going through the hormonal shifts that cause pelvic pain   ? Time 4   ? Period Months   ? Status On-going   ? Target Date 04/09/22   ?  ? PT LONG TERM GOAL #5  ? Title ability to urinate without double voiding due to relaxation of the pelvic floor muscles during urination   ? Baseline has to void 2 times and bear down to fully empty her bladder   ? Time 4   ? Period Months   ? Status On-going   ? Target Date 04/09/22   ? ?  ?  ? ?  ? ? ? ? ? ? ? ? Plan - 03/19/22 1701   ? ? Clinical Impression Statement Patient pain has stayed lower than it normally does when she is having her cycle since she is having the manual work. Patient did not have to call out of work when she is on her cycle due to less pain. Patient had less restrictions around the cervix, fallopian tubes, Patient is having mottling of her lower abdominal area due to excessive use of heat due to pain. Patient will benefit from skilled therapy to reduce her pain, improve tissue elongation, and mobility.   ? Personal Factors and Comorbidities Fitness;Comorbidity 1;Sex;Past/Current Experience;Time since onset of injury/illness/exacerbation   ? Comorbidities endometriosis   ? Examination-Activity  Limitations Locomotion Level;Lift;Stand;Stairs;Sit   ? Examination-Participation Restrictions Cleaning;Meal Prep;Occupation;Community Activity;Driving;Interpersonal Relationship;Laundry   ? Stability/Clinical Decision Making Evolving/Moderate complexity   ? Rehab  Potential Good   ? PT Frequency 1x / week   ? PT Duration 12 weeks   ? PT Treatment/Interventions ADLs/Self Care Home Management;Biofeedback;Cryotherapy;Moist Heat;Electrical Stimulation;Therapeutic activities;Therapeutic exercise;Patient/family education;Manual techniques;Dry needling;Spinal Manipulations   ? PT Next Visit Plan fascial release internally around the urterus, cervix, fallopian tube, bladder; urogenital diaphragm; see how the ovarian pain is; go over emptying bladder, stretches, HEP   ? PT Home Exercise Plan Access Code: [email or web address]   ? Recommended Other Services sent MD renewal a second time   ? Consulted and Agree with Plan of Care Patient   ? ?  ?  ? ?  ? ? ?Patient will benefit from skilled therapeutic intervention in order to improve the following deficits and impairments:  Decreased coordination, Increased fascial restricitons, Impaired tone, Decreased endurance, Increased muscle spasms, Decreased activity tolerance, Pain, Decreased strength ? ?Visit Diagnosis: ?Muscle weakness (generalized) ? ?Pelvic pain ? ?Cramp and spasm ? ? ? ? ?Problem List ?Patient Active Problem List  ? Diagnosis Date Noted  ? Chronic constipation 05/09/2020  ? History of DVT (deep vein thrombosis) 05/02/2020  ? 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 Low grade squamous intraepith lesion on cytologic smear cervix (lgsil) 11/09/2019  ? Asthma, chronic 06/22/2013  ? Migraine, unspecified 06/22/2013  ? Arthralgia 06/13/2013  ? Endometriosis 06/13/2013  ? Edema of both legs 06/13/2013  ? Weight gain 06/13/2013  ? ? ?Eulis Foster, PT ?03/19/22 5:11 PM ? ? ?[facility] ?[address] ?[place], [place], [address] ?Phone: [phone]   Fax:  [phone] ? ?Name: Lori Mays ?MRN: [id number] ?Date of Birth: 1986/04/20 ? ? ? ?

## 2022-03-26 ENCOUNTER — Encounter: Payer: Self-pay | Admitting: Physical Therapy

## 2022-04-02 ENCOUNTER — Encounter: Payer: Self-pay | Admitting: Physical Therapy

## 2022-04-02 ENCOUNTER — Ambulatory Visit: Payer: BC Managed Care – PPO | Attending: Student | Admitting: Physical Therapy

## 2022-04-02 DIAGNOSIS — R102 Pelvic and perineal pain: Secondary | ICD-10-CM | POA: Insufficient documentation

## 2022-04-02 DIAGNOSIS — M6281 Muscle weakness (generalized): Secondary | ICD-10-CM | POA: Diagnosis not present

## 2022-04-02 DIAGNOSIS — R252 Cramp and spasm: Secondary | ICD-10-CM | POA: Insufficient documentation

## 2022-04-02 NOTE — Therapy (Signed)
[place] ?[facility] [place] ?[place], [place], [address] ?Phone: [phone]   Fax:  [phone] ? ?Physical Therapy Treatment ? ?Patient Details  ?Name: Lori Mays ?MRN: [id number] ?Date of Birth: [birth year] ?Referring Provider (PT): Dr. Maceo Pro ? ? ?Encounter Date: 04/02/2022 ? ? PT End of Session - 04/02/22 1535   ? ? Visit Number 15   ? Date for PT Re-Evaluation 06/28/22   ? Authorization Type [employer] Medicaid   ? Authorization Time Period 3/22-4/11   ? Authorization - Visit Number 2   ? Authorization - Number of Visits 3   ? PT Start Time 1530   ? PT Stop Time [id number]   ? PT Time Calculation (min) 38 min   ? Activity Tolerance Patient tolerated treatment well   ? Behavior During Therapy [facility] for tasks assessed/performed   ? ?  ?  ? ?  ? ? ?Past Medical History:  ?Diagnosis Date  ? ASCUS with positive high risk HPV 06/16/2013  ? Asthma   ? Cyst 06/07/2012  ? DVT, lower extremity, proximal ([place])   ? Endometriosis   ? ? ?Past Surgical History:  ?Procedure Laterality Date  ? laproscopy  2011  ? ? ?There were no vitals filed for this visit. ? ? ? ? ? ? [facility] PT Assessment - 04/02/22 0001   ? ?  ? Assessment  ? Medical Diagnosis M79.18 Myalgia of pelvic floor   ? Referring Provider (PT) Dr. Maceo Pro   ? Prior Therapy none   ?  ? Precautions  ? Precautions None   ?  ? Home Environment  ? Living Environment Private residence   ?  ? Prior Function  ? Level of Independence Independent   ? Vocation Full time employment   ? Vocation Requirements sitting at computer   ? Leisure no exercise   ?  ? Cognition  ? Overall Cognitive Status Within Functional Limits for tasks assessed   ?  ? Strength  ? Right Hip Flexion 4/5   ? Right Hip Extension 4+/5   ? Right Hip External Rotation  5/5   ? Right Hip Internal Rotation 5/5   ? Right Hip ABduction 4/5   ? Right Hip ADduction 4/5   ? Left Hip Flexion 4/5   ? Left Hip Extension 4/5   ? Left Hip External Rotation 5/5   ?  Left Hip Internal Rotation 5/5   ? Left Hip ABduction 4/5   ? Left Hip ADduction 4/5   ? ?  ?  ? ?  ? ? ? ? ? ? ? ? ? ? ? ? ? Pelvic Floor Special Questions - 04/02/22 0001   ? ? Urinary frequency does not fully empty her bladder but it is 70% better.   ? Fecal incontinence No   has not gone to the bathroom several days  ? External Palpation negative Q-tip test   ? Pelvic Floor Internal Exam Patient confirms identification and approve PT to assess pelvic floor and treatment   ? Exam Type Vaginal   ? Palpation trouble relaxing pelvic floor after contraction   ? Strength weak squeeze, no lift   ? ?  ?  ? ?  ? ? ? ? [facility] Adult PT Treatment/Exercise - 04/02/22 0001   ? ?  ? Neuro Re-ed   ? Neuro Re-ed Details  diapragmatic breathing with pelvic drop to relax the pelvic floor   ?  ? Manual Therapy  ?  Manual Therapy Internal Pelvic Floor;Myofascial release;Soft tissue mobilization   ? Soft tissue mobilization circular massage to the abdomen to improve peristalic motion of the intestines with bood bowel sounds   ? Myofascial Release fascial release of the lower abdomen to lift the intestines off the bladder   ? Internal Pelvic Floor manual work to the levator ani, and along the obutrator internist while monitoring for the muscles to elongate   ? ?  ?  ? ?  ? ? ? ? ? ? ? ? ? ? ? ? PT Short Term Goals - 03/05/22 1717   ? ?  ? PT SHORT TERM GOAL #1  ? Title independent with initial HEP for meditation, stretches, and diaphragmatic breathing to relax the pelvic floor   ? Time 4   ? Period Weeks   ? Status Achieved   ? Target Date 09/04/21   ?  ? PT SHORT TERM GOAL #2  ? Title understand how to perform manual abdominal massage to assist in peristalic motion of the intestines for easier bowel movements   ? Time 4   ? Period Weeks   ? Status Achieved   ? Target Date 09/04/21   ?  ? PT SHORT TERM GOAL #3  ? Title understand different ways to manage pain with down regulating her nervous system   ? Time 4   ? Period Weeks   ? Status  Achieved   ? ?  ?  ? ?  ? ? ? ? PT Long Term Goals - 04/02/22 1543   ? ?  ? PT LONG TERM GOAL #1  ? Title independent with advanced HEP with pain management, stretches, gentle strengthening and self massage techniques   ? Baseline HEP is updated as she progresses   ? Time 4   ? Period Weeks   ? Status On-going   ? Target Date 04/09/22   ?  ? PT LONG TERM GOAL #2  ? Title able to allow the therapisr perform internal manual therapy and also have patient perform on herself to reduce trigger points of the pelvic floor to manage her pain   ? Baseline patient is using her hand to work on the trigger points in the pelvic floor   ? Time 4   ? Period Months   ? Status On-going   ?  ? PT LONG TERM GOAL #3  ? Title Patient able to toilet correctly and reduce straining by 50% due to elongation of the pelvic floor muscles   ? Baseline stain to have a bowel movement has improved by 20% , type 1 every couple of days and and actual bowel movement is 1 time per week   ? Time 4   ? Period Months   ? Status On-going   ?  ? PT LONG TERM GOAL #4  ? Title pelvic and abdominal pain at [facility] level </= 4/10 50% of the day due to reduction of trigger points   ? Baseline pain level 5-8/10, right now she is not on birth control so she is going through the hormonal shifts that cause pelvic pain   ? Time 4   ? Period Months   ? Status On-going   ? Target Date 04/09/22   ?  ? PT LONG TERM GOAL #5  ? Title ability to urinate without double voiding due to relaxation of the pelvic floor muscles during urination   ? Baseline has to void 3 times and bear down to fully  empty her bladder   ? ?  ?  ? ?  ? ? ? ? ? ? ? ? Plan - 04/02/22 1547   ? ? Clinical Impression Statement Patient has to urinate 3 times to fully empty her bladder. [place] pain level ranges from 5-8/10 due to being off her birth control and her hormones increasing to have egg retrieval. Patient has a complete bowel movement 1 tme per week. Patient is able to perform a pelvic drop in  supine with diaphragmatic breathing to elongat ethe msucls. Pelvic floor strength is 2/5 due to the increased tone from the pelvic pain. She is using the techniques given to her for pain management including stretches, meditation, and manual work to the abdomen and pelvic floor. Patient has been using increased amount of heat which is causing mottling of the lower abdomen. Patient has to strain to have a bowel movement . She has some increase in bilateral hip strength. Patient will benefit from skilled therapy to reduce her pain, improve tissue elongation, and mobility.   ? Personal Factors and Comorbidities Fitness;Comorbidity 1;Sex;Past/Current Experience;Time since onset of injury/illness/exacerbation   ? Comorbidities endometriosis   ? Examination-Activity Limitations Locomotion Level;Lift;Stand;Stairs;Sit   ? Examination-Participation Restrictions Cleaning;Meal Prep;Occupation;Community Activity;Driving;Interpersonal Relationship;Laundry   ? Stability/Clinical Decision Making Evolving/Moderate complexity   ? Rehab Potential Good   ? PT Frequency 1x / week   ? PT Duration 12 weeks   ? PT Treatment/Interventions ADLs/Self Care Home Management;Biofeedback;Cryotherapy;Moist Heat;Electrical Stimulation;Therapeutic activities;Therapeutic exercise;Patient/family education;Manual techniques;Dry needling;Spinal Manipulations   ? PT Next Visit Plan fascial release internally around the urterus, cervix, fallopian tube, bladder; urogenital diaphragm; see how the ovarian pain is;prone pelvic floor decompression,   ? PT Home Exercise Plan Access Code: [email or web address]   ? Recommended Other Services MD signed all notes; put in for medicaid visits   ? Consulted and Agree with Plan of Care Patient   ? ?  ?  ? ?  ? ? ?Patient will benefit from skilled therapeutic intervention in order to improve the following deficits and impairments:  Decreased coordination, Increased fascial restricitons, Impaired tone, Decreased endurance, Increased  muscle spasms, Decreased activity tolerance, Pain, Decreased strength ? ?Visit Diagnosis: ?Muscle weakness (generalized) ? ?Pelvic pain ? ?Cramp and spasm ? ? ? ? ?Problem List ?Patient Active Proble

## 2022-04-09 ENCOUNTER — Encounter: Payer: Self-pay | Admitting: Physical Therapy

## 2022-04-09 ENCOUNTER — Ambulatory Visit: Payer: BC Managed Care – PPO | Admitting: Physical Therapy

## 2022-04-09 DIAGNOSIS — R102 Pelvic and perineal pain: Secondary | ICD-10-CM | POA: Diagnosis not present

## 2022-04-09 DIAGNOSIS — R252 Cramp and spasm: Secondary | ICD-10-CM | POA: Diagnosis not present

## 2022-04-09 DIAGNOSIS — M6281 Muscle weakness (generalized): Secondary | ICD-10-CM | POA: Diagnosis not present

## 2022-04-09 NOTE — Therapy (Signed)
[place] ?[facility] ?[address] ?[place], [place], [address] ?Phone: [phone]   Fax:  [phone] ? ?Physical Therapy Treatment ? ?Patient Details  ?Name: Lori Mays ?MRN: [id number] ?Date of Birth: [birth year] ?Referring Provider (PT): Dr. Carmelina NounJanelle Moulder ? ? ?Encounter Date: 04/09/2022 ? ? PT End of Session - 04/09/22 1622   ? ? Visit Number 16   ? Date for PT Re-Evaluation 06/28/22   ? Authorization Type [employer] Medicaid   ? Authorization Time Period 4/12-7/4   ? Authorization - Visit Number 1   ? Authorization - Number of Visits 12   ? PT Start Time 1622   late and went to the bathroom  ? PT Stop Time 1655   ? PT Time Calculation (min) 33 min   ? Activity Tolerance Patient tolerated treatment well   ? Behavior [facility] Therapy [facility] for tasks assessed/performed   ? ?  ?  ? ?  ? ? ?Past Medical History:  ?Diagnosis Date  ? ASCUS with positive high risk HPV 06/16/2013  ? Asthma   ? Cyst 06/07/2012  ? DVT, lower extremity, proximal (HCC)   ? Endometriosis   ? ? ?Past Surgical History:  ?Procedure Laterality Date  ? laproscopy  2011  ? ? ?There were no vitals filed for this visit. ? ? Subjective Assessment - 04/09/22 1623   ? ? Subjective I am getting quite a bit of right ovary. Abdomen  and feet are swollen.   ? Patient Stated Goals reduce pain   ? Currently in Pain? Yes   ? Pain Score 6    ? Pain Location Abdomen   ? Pain Orientation Right;Left   ? Pain Descriptors / Indicators Sharp   ? Pain Type Chronic pain   ? Pain Onset More than a month ago   ? Pain Frequency Intermittent   ? Aggravating Factors  not sure what triggers the pain, it comes on random   ? Pain Relieving Factors over the counter pain medications, topical ointment, abdominal massage, stretches, meditation   ? Multiple Pain Sites No   ? ?  ?  ? ?  ? ? ? ? ? ? ? ? ? ? ? ? ? ? ? ? ? ? ? ? [facility] Adult PT Treatment/Exercise - 04/09/22 0001   ? ?  ? Lumbar Exercises: Quadruped  ? Other Quadruped Lumbar  Exercises quadruped with hips in IR rocking back and forth,   ? Other Quadruped Lumbar Exercises supportive childs pose   ?  ? Manual Therapy  ? Manual Therapy Soft tissue mobilization   ? Soft tissue mobilization using the addaday to the hips, and back while in childs pose, prone manual work to bilateral hamstring.   ? Myofascial Release fascial release to the lower abdomen with one hand on the lower abdomen and the other on the sacrum, release around the inguinal ligament bilaterally, release in the umbilicus,   ? ?  ?  ? ?  ? ? ? ? ? ? ? ? ? ? ? ? PT Short Term Goals - 03/05/22 1717   ? ?  ? PT SHORT TERM GOAL #1  ? Title independent with initial HEP for meditation, stretches, and diaphragmatic breathing to relax the pelvic floor   ? Time 4   ? Period Weeks   ? Status Achieved   ? Target Date 09/04/21   ?  ? PT SHORT TERM GOAL #2  ? Title understand how to perform  manual abdominal massage to assist in peristalic motion of the intestines for easier bowel movements   ? Time 4   ? Period Weeks   ? Status Achieved   ? Target Date 09/04/21   ?  ? PT SHORT TERM GOAL #3  ? Title understand different ways to manage pain with down regulating her nervous system   ? Time 4   ? Period Weeks   ? Status Achieved   ? ?  ?  ? ?  ? ? ? ? PT Long Term Goals - 04/02/22 1543   ? ?  ? PT LONG TERM GOAL #1  ? Title independent with advanced HEP with pain management, stretches, gentle strengthening and self massage techniques   ? Baseline HEP is updated as she progresses   ? Time 4   ? Period Weeks   ? Status On-going   ? Target Date 04/09/22   ?  ? PT LONG TERM GOAL #2  ? Title able to allow the therapisr perform internal manual therapy and also have patient perform on herself to reduce trigger points of the pelvic floor to manage her pain   ? Baseline patient is using her hand to work on the trigger points in the pelvic floor   ? Time 4   ? Period Months   ? Status On-going   ?  ? PT LONG TERM GOAL #3  ? Title Patient able to toilet  correctly and reduce straining by 50% due to elongation of the pelvic floor muscles   ? Baseline stain to have a bowel movement has improved by 20% , type 1 every couple of days and and actual bowel movement is 1 time per week   ? Time 4   ? Period Months   ? Status On-going   ?  ? PT LONG TERM GOAL #4  ? Title pelvic and abdominal pain at [facility] level </= 4/10 50% of the day due to reduction of trigger points   ? Baseline pain level 5-8/10, right now she is not on birth control so she is going through the hormonal shifts that cause pelvic pain   ? Time 4   ? Period Months   ? Status On-going   ? Target Date 04/09/22   ?  ? PT LONG TERM GOAL #5  ? Title ability to urinate without double voiding due to relaxation of the pelvic floor muscles during urination   ? Baseline has to void 3 times and bear down to fully empty her bladder   ? ?  ?  ? ?  ? ? ? ? ? ? ? ? Plan - 04/09/22 1658   ? ? Clinical Impression Statement Patient pain level decreased from 6/10 to 1/10 after the manual work. Patient had fascial restrictions of the abdomen. Patient continues to have issues with pain due to her endometriosis. Patient had increased swelling of the abdomen. Patient needed pillows to be under her trunk to fully relax in childs pose. Patient has issues with her intestines and bowel movements. Patient had good bowel sounds with manual work. Patient will benefit from skilled therapy to reduce her pain, improve tissue elongation, and mobility.   ? Personal Factors and Comorbidities Fitness;Comorbidity 1;Sex;Past/Current Experience;Time since onset of injury/illness/exacerbation   ? Comorbidities endometriosis   ? Examination-Activity Limitations Locomotion Level;Lift;Stand;Stairs;Sit   ? Examination-Participation Restrictions Cleaning;Meal Prep;Occupation;Community Activity;Driving;Interpersonal Relationship;Laundry   ? Stability/Clinical Decision Making Evolving/Moderate complexity   ? Rehab Potential Good   ? PT  Frequency 1x /  week   ? PT Duration 12 weeks   ? PT Treatment/Interventions ADLs/Self Care Home Management;Biofeedback;Cryotherapy;Moist Heat;Electrical Stimulation;Therapeutic activities;Therapeutic exercise;Patient/family education;Manual techniques;Dry needling;Spinal Manipulations   ? PT Next Visit Plan fascial release internally around the urterus, cervix, fallopian tube, bladder; urogenital diaphragm, prone pelvic floor decompression, yoga   ? PT Home Exercise Plan Access Code: [email or web address]   ? Consulted and Agree with Plan of Care Patient   ? ?  ?  ? ?  ? ? ?Patient will benefit from skilled therapeutic intervention in order to improve the following deficits and impairments:  Decreased coordination, Increased fascial restricitons, Impaired tone, Decreased endurance, Increased muscle spasms, Decreased activity tolerance, Pain, Decreased strength ? ?Visit Diagnosis: ?Muscle weakness (generalized) ? ?Pelvic pain ? ?Cramp and spasm ? ? ? ? ?Problem List ?Patient Active Problem List  ? Diagnosis Date Noted  ? Chronic constipation 05/09/2020  ? History of DVT (deep vein thrombosis) 05/02/2020  ? 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 Low grade squamous intraepith lesion on cytologic smear cervix (lgsil) 11/09/2019  ? Asthma, chronic 06/22/2013  ? Migraine, unspecified 06/22/2013  ? Arthralgia 06/13/2013  ? Endometriosis 06/13/2013  ? Edema of both legs 06/13/2013  ? Weight gain 06/13/2013  ? ? ?Eulis Foster, PT ?04/09/22 5:04 PM ? ? ?[place] ?[facility] ?[address] ?[place], [place], [address] ?Phone: [phone]   Fax:  [phone] ? ?Name: Mazella Deen ?MRN: [id number] ?Date of Birth: [birth year] ? ? ? ?

## 2022-04-14 ENCOUNTER — Encounter: Payer: Self-pay | Admitting: Physical Therapy

## 2022-04-23 ENCOUNTER — Ambulatory Visit: Payer: BC Managed Care – PPO | Admitting: Physical Therapy

## 2022-04-23 ENCOUNTER — Encounter: Payer: Self-pay | Admitting: Physical Therapy

## 2022-04-23 DIAGNOSIS — M6281 Muscle weakness (generalized): Secondary | ICD-10-CM | POA: Diagnosis not present

## 2022-04-23 DIAGNOSIS — R102 Pelvic and perineal pain: Secondary | ICD-10-CM

## 2022-04-23 DIAGNOSIS — R252 Cramp and spasm: Secondary | ICD-10-CM | POA: Diagnosis not present

## 2022-04-23 NOTE — Patient Instructions (Signed)
Access Code: [email or web address] ?URL: [email or web address] ?Date: 04/23/2022 ?Prepared by: Eulis Foster ? ?Exercises ?- Beginner Bridge  - 1 x daily - 3 x weekly - 1 sets - 10 reps ?- Prone Hip Extension - Two Pillows  - 1 x daily - 3 x weekly - 1 sets - 10 reps ?- Hooklying Heel Slide  - 1 x daily - 3 x weekly - 1 sets - 5 reps ?- Bent Knee Fallouts  - 1 x daily - 3 x weekly - 1 sets - 5 reps ?[facility] ?[address], [address] ?[place], [place] [address] ?Phone # [phone] ?Fax [phone] ? ?

## 2022-04-23 NOTE — Therapy (Signed)
[place] [facility] ?[address] ?[place], [place], [address] ?Phone: [phone]   Fax:  [phone] ? ?Physical Therapy Treatment ? ?Patient Details  ?Name: Lori Mays ?MRN: [id number] ?Date of Birth: [birth year] ?Referring Provider (PT): Dr. Carmelina Noun ? ? ?Encounter Date: 04/23/2022 ? ? PT End of Session - 04/23/22 1705   ? ? Visit Number 17   ? Date for PT Re-Evaluation 06/28/22   ? Authorization Type [employer]   ? Authorization Time Period 4/12-7/4   ? Authorization - Visit Number 2   ? Authorization - Number of Visits 12   ? PT Start Time 1615   ? PT Stop Time 1655   ? PT Time Calculation (min) 40 min   ? Activity Tolerance Patient tolerated treatment well   ? Behavior During Therapy [facility] for tasks assessed/performed   ? ?  ?  ? ?  ? ? ?Past Medical History:  ?Diagnosis Date  ? ASCUS with positive high risk HPV 06/16/2013  ? Asthma   ? Cyst 06/07/2012  ? DVT, lower extremity, proximal (HCC)   ? Endometriosis   ? ? ?Past Surgical History:  ?Procedure Laterality Date  ? laproscopy  2011  ? ? ?There were no vitals filed for this visit. ? ? Subjective Assessment - 04/23/22 1620   ? ? Subjective I am able to calm my pain for several minutes per day. Feet and knees are very swollen for days. I have had compression stockings before for the swelling. Felt better after last treatment and lasted several days.   ? Patient Stated Goals reduce pain   ? Currently in Pain? Yes   ? Pain Score 8    ? Pain Location Abdomen   ? Pain Orientation Right;Left   ? Pain Descriptors / Indicators Aching;Cramping   ? Pain Type Chronic pain   ? Pain Radiating Towards radiates down to left foot   ? Pain Onset More than a month ago   ? Pain Frequency Intermittent   ? Aggravating Factors  not sure random pain   ? Pain Relieving Factors topical ointment, abdominal massage, stretches, meditation, over the counter lpain medication   ? ?  ?  ? ?  ? ? ? ? ? ? ? ? ? ? ? ? ? ? ? ? ? ? ? ?  [facility] Adult PT Treatment/Exercise - 04/23/22 0001   ? ?  ? Lumbar Exercises: Stretches  ? Other Lumbar Stretch Exercise neurotension stretch to each leg moving 10x each   ?  ? Lumbar Exercises: Supine  ? Clam 5 reps;1 second   right, left  ? Clam Limitations abdominal contraction going slowly and lmited range   ? Heel Slides 5 reps   each side half way  ? Heel Slides Limitations contract the abdominals   ?  ? Manual Therapy  ? Manual Therapy Soft tissue mobilization;Myofascial release   ? Soft tissue mobilization using the prickly roller to massage the bil. legs and gluteals   ? Myofascial Release fascial release for pelvic decompression with one hand on the sacrum and other on lower abdomen; release of levator ani with one  hand on the abdomen and other on levator; then one hand on levator and other hand internally rotating hip   ? ?  ?  ? ?  ? ? ? ? ? ? ? ? ? ? PT Education - 04/23/22 1701   ? ? Education Details neural tension stretch of legs;  using prickly roller on legs for swelling;Access Code: [email or web address]   ? Person(s) Educated Patient   ? Methods Explanation;Demonstration;Verbal cues;Handout   ? Comprehension Returned demonstration;Verbalized understanding   ? ?  ?  ? ?  ? ? ? PT Short Term Goals - 03/05/22 1717   ? ?  ? PT SHORT TERM GOAL #1  ? Title independent with initial HEP for meditation, stretches, and diaphragmatic breathing to relax the pelvic floor   ? Time 4   ? Period Weeks   ? Status Achieved   ? Target Date 09/04/21   ?  ? PT SHORT TERM GOAL #2  ? Title understand how to perform manual abdominal massage to assist in peristalic motion of the intestines for easier bowel movements   ? Time 4   ? Period Weeks   ? Status Achieved   ? Target Date 09/04/21   ?  ? PT SHORT TERM GOAL #3  ? Title understand different ways to manage pain with down regulating her nervous system   ? Time 4   ? Period Weeks   ? Status Achieved   ? ?  ?  ? ?  ? ? ? ? PT Long Term Goals - 04/02/22 1543   ? ?  ? PT LONG TERM GOAL  #1  ? Title independent with advanced HEP with pain management, stretches, gentle strengthening and self massage techniques   ? Baseline HEP is updated as she progresses   ? Time 4   ? Period Weeks   ? Status On-going   ? Target Date 04/09/22   ?  ? PT LONG TERM GOAL #2  ? Title able to allow the therapisr perform internal manual therapy and also have patient perform on herself to reduce trigger points of the pelvic floor to manage her pain   ? Baseline patient is using her hand to work on the trigger points in the pelvic floor   ? Time 4   ? Period Months   ? Status On-going   ?  ? PT LONG TERM GOAL #3  ? Title Patient able to toilet correctly and reduce straining by 50% due to elongation of the pelvic floor muscles   ? Baseline stain to have a bowel movement has improved by 20% , type 1 every couple of days and and actual bowel movement is 1 time per week   ? Time 4   ? Period Months   ? Status On-going   ?  ? PT LONG TERM GOAL #4  ? Title pelvic and abdominal pain at [facility] level </= 4/10 50% of the day due to reduction of trigger points   ? Baseline pain level 5-8/10, right now she is not on birth control so she is going through the hormonal shifts that cause pelvic pain   ? Time 4   ? Period Months   ? Status On-going   ? Target Date 04/09/22   ?  ? PT LONG TERM GOAL #5  ? Title ability to urinate without double voiding due to relaxation of the pelvic floor muscles during urination   ? Baseline has to void 3 times and bear down to fully empty her bladder   ? ?  ?  ? ?  ? ? ? ? ? ? ? ? Plan - 04/23/22 1701   ? ? Clinical Impression Statement Patient is having sciatic pain down leg and learned neural tension stretch to help. She is having swelling of the legs  so therapist suggested she use compression stockings. Patient left with no pain after the manual work and her abdomen was softer afterwards. She had difficulty with the strengthening exercises due to being weak and decreased endurance. She would shake  while doing the exercises. Patient will benefit from skilled therapy to reduce her pain, improve tissue elongation, and work on mobility.   ? Personal Factors and Comorbidities Fitness;Comorbidity 1;Sex;Past/Current Experience;Time since onset of injury/illness/exacerbation   ? Comorbidities endometriosis   ? Examination-Activity Limitations Locomotion Level;Lift;Stand;Stairs;Sit   ? Examination-Participation Restrictions Cleaning;Meal Prep;Occupation;Community Activity;Driving;Interpersonal Relationship;Laundry   ? Stability/Clinical Decision Making Evolving/Moderate complexity   ? Rehab Potential Good   ? PT Frequency 1x / week   ? PT Duration 12 weeks   ? PT Treatment/Interventions ADLs/Self Care Home Management;Biofeedback;Cryotherapy;Moist Heat;Electrical Stimulation;Therapeutic activities;Therapeutic exercise;Patient/family education;Manual techniques;Dry needling;Spinal Manipulations   ? PT Next Visit Plan fascial release internally around the urterus, cervix, fallopian tube, bladder; urogenital diaphragm, prone pelvic floor decompression, yoga   ? PT Home Exercise Plan Access Code: [email or web address]   ? Consulted and Agree with Plan of Care Patient   ? ?  ?  ? ?  ? ? ?Patient will benefit from skilled therapeutic intervention in order to improve the following deficits and impairments:  Decreased coordination, Increased fascial restricitons, Impaired tone, Decreased endurance, Increased muscle spasms, Decreased activity tolerance, Pain, Decreased strength ? ?Visit Diagnosis: ?Muscle weakness (generalized) ? ?Pelvic pain ? ?Cramp and spasm ? ? ? ? ?Problem List ?Patient Active Problem List  ? Diagnosis Date Noted  ? Chronic constipation 05/09/2020  ? History of DVT (deep vein thrombosis) 05/02/2020  ? 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 Low grade squamous intraepith lesion on cytologic smear cervix (lgsil) 11/09/2019  ? Asthma, chronic 06/22/2013  ? Migraine, unspecified 06/22/2013  ? Arthralgia  06/13/2013  ? Endometriosis 06/13/2013  ? Edema of both legs 06/13/2013  ? Weight gain 06/13/2013  ? ? ?Eulis Fosterheryl Matisha Termine, PT ?04/23/22 5:06 PM ? ? ?[facility] ?3

## 2022-05-02 ENCOUNTER — Ambulatory Visit: Payer: BC Managed Care – PPO | Attending: Nurse Practitioner

## 2022-05-06 ENCOUNTER — Other Ambulatory Visit (HOSPITAL_COMMUNITY)
Admission: RE | Admit: 2022-05-06 | Discharge: 2022-05-06 | Disposition: A | Discharge status: Home / Self Care | Payer: BC Managed Care – PPO | Source: Ambulatory Visit | Attending: Family Medicine | Admitting: Family Medicine

## 2022-05-06 ENCOUNTER — Ambulatory Visit (INDEPENDENT_AMBULATORY_CARE_PROVIDER_SITE_OTHER): Payer: BC Managed Care – PPO

## 2022-05-06 VITALS — BP 119/77 | HR 64 | Wt 166.7 lb

## 2022-05-06 DIAGNOSIS — N898 Other specified noninflammatory disorders of vagina: Secondary | ICD-10-CM | POA: Diagnosis not present

## 2022-05-06 DIAGNOSIS — R109 Unspecified abdominal pain: Secondary | ICD-10-CM | POA: Diagnosis not present

## 2022-05-06 NOTE — Progress Notes (Signed)
Here today with complaint of vaginal irritation. Denies abnormal odor. Describes color of discharge as whiter than normal. Reports uterine cramps. Has discontinued Orlissa for endometriosis because she is in process of egg retrieval. Plans to resume Orlissa following procedure. Would like recommendation from Anyanwu, MD regarding which pain medication is most effective for endometriosis related pain. Explained I will reach out to provider and notify patient of recommendation. Self swab instructions given and specimen obtained. Explained we will contact patient with abnormal results. ? Apolonio Schneiders RN ?05/06/22 ?

## 2022-05-07 ENCOUNTER — Telehealth: Payer: Self-pay | Admitting: General Practice

## 2022-05-07 ENCOUNTER — Other Ambulatory Visit: Payer: Self-pay | Admitting: Family Medicine

## 2022-05-07 LAB — CERVICOVAGINAL ANCILLARY ONLY
Bacterial Vaginitis (gardnerella): POSITIVE — AB
Candida Glabrata: NEGATIVE
Candida Vaginitis: NEGATIVE
Chlamydia: NEGATIVE
Comment: NEGATIVE
Comment: NEGATIVE
Comment: NEGATIVE
Comment: NEGATIVE
Comment: NEGATIVE
Comment: NORMAL
Neisseria Gonorrhea: NEGATIVE
Trichomonas: NEGATIVE

## 2022-05-07 MED ORDER — METRONIDAZOLE 500 MG PO TABS: 500.0000 mg | ORAL_TABLET | Freq: Two times a day (BID) | ORAL | 0 refills | Status: AC

## 2022-05-07 NOTE — Telephone Encounter (Signed)
Patient called and left message on nurse voicemail line stating she is calling for test results. Patient states her Lori Mays is messed up so she cannot see anything.  ? ?Called patient and discussed her results are not back yet. Advised they should be back later today or tomorrow morning. Patient verbalized understanding.  ?

## 2022-05-12 DIAGNOSIS — N809 Endometriosis, unspecified: Secondary | ICD-10-CM

## 2022-05-13 ENCOUNTER — Telehealth: Payer: BC Managed Care – PPO | Admitting: Nurse Practitioner

## 2022-05-19 ENCOUNTER — Encounter: Payer: Self-pay | Admitting: Physical Therapy

## 2022-05-19 ENCOUNTER — Ambulatory Visit: Payer: BC Managed Care – PPO | Attending: Student | Admitting: Physical Therapy

## 2022-05-19 DIAGNOSIS — M6281 Muscle weakness (generalized): Secondary | ICD-10-CM | POA: Diagnosis not present

## 2022-05-19 DIAGNOSIS — R102 Pelvic and perineal pain: Secondary | ICD-10-CM | POA: Insufficient documentation

## 2022-05-19 DIAGNOSIS — R252 Cramp and spasm: Secondary | ICD-10-CM | POA: Insufficient documentation

## 2022-05-19 NOTE — Therapy (Signed)
[facility] [address] [place], [place], [address] Phone: [phone]   Fax:  [phone]  Physical Therapy Treatment  Patient Details  Name: Lori Mays MRN: [id number] Date of Birth: 05-28-1986 Referring Provider (PT): Dr. Carmelina Noun   Encounter Date: 05/19/2022   PT End of Session - 05/19/22 1406     Visit Number 18    Date for PT Re-Evaluation 06/28/22    Authorization Type [employer] Medicaid    Authorization Time Period 4/12-7/4    Authorization - Visit Number 3    Authorization - Number of Visits 12    PT Start Time 1405    PT Stop Time 1443    PT Time Calculation (min) 38 min    Activity Tolerance Patient tolerated treatment well    Behavior During Therapy [facility] for tasks assessed/performed             Past Medical History:  Diagnosis Date   ASCUS with positive high risk HPV 06/16/2013   Asthma    Cyst 06/07/2012   DVT, lower extremity, proximal (HCC)    Endometriosis     Past Surgical History:  Procedure Laterality Date   laproscopy  2011    There were no vitals filed for this visit.   Subjective Assessment - 05/19/22 1407     Subjective Most of the time the pain has been consistently managable. I am on my cycle and the pain has been manageable. No pain right now just bloating.    Patient Stated Goals reduce pain    Currently in Pain? No/denies                               [facility] Adult PT Treatment/Exercise - 05/19/22 0001       Self-Care   Self-Care Other Self-Care Comments    Other Self-Care Comments  how to lay with her hips elevated and feet whiel perfroming diaphragmatic breathing to reduce her bloating for 2 minutes. and how to perfrom lymph massage      Neuro Re-ed    Neuro Re-ed Details  diaphragmatic breathing to expand her rib cage 360 and relax the pelvic floor      Lumbar Exercises: Stretches   Double Knee to Chest Stretch 1 rep;60 seconds    Double  Knee to Chest Stretch Limitations with rocking the legs side to side and breathing to open up the posterior rib cage    Lower Trunk Rotation 2 reps;30 seconds   right left supine   Hip Flexor Stretch Right;Left;1 rep;30 seconds    Hip Flexor Stretch Limitations prone with knee flexed and tighten the gluteals    Other Lumbar Stretch Exercise happy baby stretch holding for 30 seconds      Manual Therapy   Manual Therapy Myofascial release    Myofascial Release fascial release throughout the abdomen especially the upper abdomen, tissue rolling of the abdomen                     PT Education - 05/19/22 1442     Education Details Access Code: [email or web address];l laying with hips elevated and perform lymph drainage to reduce the bloating    Person(s) Educated Patient    Methods Explanation;Demonstration;Verbal cues;Handout    Comprehension Returned demonstration;Verbalized understanding              PT Short Term Goals - 03/05/22 1717  PT SHORT TERM GOAL #1   Title independent with initial HEP for meditation, stretches, and diaphragmatic breathing to relax the pelvic floor    Time 4    Period Weeks    Status Achieved    Target Date 09/04/21      PT SHORT TERM GOAL #2   Title understand how to perform manual abdominal massage to assist in peristalic motion of the intestines for easier bowel movements    Time 4    Period Weeks    Status Achieved    Target Date 09/04/21      PT SHORT TERM GOAL #3   Title understand different ways to manage pain with down regulating her nervous system    Time 4    Period Weeks    Status Achieved               PT Long Term Goals - 04/02/22 1543       PT LONG TERM GOAL #1   Title independent with advanced HEP with pain management, stretches, gentle strengthening and self massage techniques    Baseline HEP is updated as she progresses    Time 4    Period Weeks    Status On-going    Target Date 04/09/22      PT LONG TERM  GOAL #2   Title able to allow the therapisr perform internal manual therapy and also have patient perform on herself to reduce trigger points of the pelvic floor to manage her pain    Baseline patient is using her hand to work on the trigger points in the pelvic floor    Time 4    Period Months    Status On-going      PT LONG TERM GOAL #3   Title Patient able to toilet correctly and reduce straining by 50% due to elongation of the pelvic floor muscles    Baseline stain to have a bowel movement has improved by 20% , type 1 every couple of days and and actual bowel movement is 1 time per week    Time 4    Period Months    Status On-going      PT LONG TERM GOAL #4   Title pelvic and abdominal pain at [facility] level </= 4/10 50% of the day due to reduction of trigger points    Baseline pain level 5-8/10, right now she is not on birth control so she is going through the hormonal shifts that cause pelvic pain    Time 4    Period Months    Status On-going    Target Date 04/09/22      PT LONG TERM GOAL #5   Title ability to urinate without double voiding due to relaxation of the pelvic floor muscles during urination    Baseline has to void 3 times and bear down to fully empty her bladder                   Plan - 05/19/22 1443     Clinical Impression Statement Patient is having more days with less pain. She is on her cycle and the pain has not increased. She had bloating today and therapist educated her on how to reduce the bloating. Patient has increased fascial restrictions in the upper abdomen. She has increased softness of the lower abdomen after manual work and less bloating. Patient will benefit from skilled therapy to improve tissue elongation and work on mobility.    Personal Factors  and Comorbidities Fitness;Comorbidity 1;Sex;Past/Current Experience;Time since onset of injury/illness/exacerbation    Comorbidities endometriosis    Examination-Activity Limitations Locomotion  Level;Lift;Stand;Stairs;Sit    Examination-Participation Restrictions Cleaning;Meal Prep;Occupation;Community Activity;Driving;Interpersonal Relationship;Laundry    Stability/Clinical Decision Making Evolving/Moderate complexity    Rehab Potential Good    PT Frequency 1x / week    PT Duration 12 weeks    PT Treatment/Interventions ADLs/Self Care Home Management;Biofeedback;Cryotherapy;Moist Heat;Electrical Stimulation;Therapeutic activities;Therapeutic exercise;Patient/family education;Manual techniques;Dry needling;Spinal Manipulations    PT Next Visit Plan fascial release of the upper abdomen, pelvic floor decompression in prone, see how the stretches are going, internal work    PT Home Exercise Plan Access Code: [email or web address]    Consulted and Agree with Plan of Care Patient             Patient will benefit from skilled therapeutic intervention in order to improve the following deficits and impairments:  Decreased coordination, Increased fascial restricitons, Impaired tone, Decreased endurance, Increased muscle spasms, Decreased activity tolerance, Pain, Decreased strength  Visit Diagnosis: Muscle weakness (generalized)  Pelvic pain  Cramp and spasm     Problem List Patient Active Problem List   Diagnosis Date Noted   Chronic constipation 05/09/2020   History of DVT (deep vein thrombosis) 05/02/2020   Dysplasia of cervix, 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   Low grade squamous intraepith lesion on cytologic smear cervix (lgsil) 11/09/2019   Asthma, chronic 06/22/2013   Migraine, unspecified 06/22/2013   Arthralgia 06/13/2013   Endometriosis 06/13/2013   Edema of both legs 06/13/2013   Weight gain 06/13/2013    Eulis Fosterheryl Kelson Queenan, PT 05/19/22 2:47 PM  [facility] [address] [place], [place], [address] Phone: [phone]   Fax:  [phone]  Name: Lori Mays MRN: [id number] Date of Birth: [birth year]

## 2022-05-19 NOTE — Patient Instructions (Signed)
Access Code: [email or web address] URL: [email or web address] Date: 05/19/2022 Prepared by: Earlie Counts  Exercises - - Happy Baby with Pelvic Floor Lengthening  - 1 x daily - 3 x weekly - 1 sets - 2 reps - 30 sec hold - Supine Double Knee to Chest  - 1 x daily - 3 x weekly - 1 sets - 2 reps - 30 sec hold - Child's Pose Stretch  - 1 x daily - 3 x weekly - 1 sets - 2 reps - 30 sec hold - Supine Piriformis Stretch with Leg Straight  - 1 x daily - 3 x weekly - 1 sets - 2 reps - 30 sec hold [facility] [address], [place], [place] [address] Phone # [phone] Fax [phone]

## 2022-05-27 MED ORDER — ACETAMINOPHEN 500 MG PO TABS: 1000.0000 mg | ORAL_TABLET | Freq: Four times a day (QID) | ORAL | 1 refills | Status: AC | PRN

## 2022-05-27 MED ORDER — IBUPROFEN 800 MG PO TABS: 800.0000 mg | ORAL_TABLET | Freq: Three times a day (TID) | ORAL | 1 refills | Status: DC | PRN

## 2022-05-28 ENCOUNTER — Encounter: Payer: BC Managed Care – PPO | Admitting: Physical Therapy

## 2022-06-02 ENCOUNTER — Ambulatory Visit: Payer: BC Managed Care – PPO | Admitting: Physical Therapy

## 2022-06-03 ENCOUNTER — Ambulatory Visit: Payer: BC Managed Care – PPO | Admitting: Physician Assistant

## 2022-06-03 ENCOUNTER — Ambulatory Visit: Payer: BC Managed Care – PPO | Admitting: Gastroenterology

## 2022-06-03 ENCOUNTER — Encounter: Payer: Self-pay | Admitting: Physician Assistant

## 2022-06-03 ENCOUNTER — Ambulatory Visit (INDEPENDENT_AMBULATORY_CARE_PROVIDER_SITE_OTHER): Payer: BC Managed Care – PPO | Admitting: Physician Assistant

## 2022-06-03 VITALS — BP 92/66 | HR 60 | Ht 69.0 in | Wt 162.4 lb

## 2022-06-03 DIAGNOSIS — Z8742 Personal history of other diseases of the female genital tract: Secondary | ICD-10-CM | POA: Diagnosis not present

## 2022-06-03 DIAGNOSIS — R1084 Generalized abdominal pain: Secondary | ICD-10-CM | POA: Diagnosis not present

## 2022-06-03 DIAGNOSIS — R109 Unspecified abdominal pain: Secondary | ICD-10-CM

## 2022-06-03 MED ORDER — LINACLOTIDE 145 MCG PO CAPS: 145.0000 ug | ORAL_CAPSULE | Freq: Every day | ORAL | 0 refills | Status: DC

## 2022-06-03 MED ORDER — LINACLOTIDE 72 MCG PO CAPS: 72.0000 ug | ORAL_CAPSULE | Freq: Every day | ORAL | 0 refills | Status: DC

## 2022-06-03 NOTE — Patient Instructions (Signed)
If you are age 36 or younger, your body mass index should be between 19-25. Your Body mass index is 23.98 kg/m. If this is out of the aformentioned range listed, please consider follow up with your Primary Care Provider.  ________________________________________________________  The [facility] providers would like to encourage you to use [facility] to communicate with providers for non-urgent requests or questions.  Due to long hold times on the telephone, sending your provider a message by [facility] may be a faster and more efficient way to get a response.  Please allow 48 business hours for a response.  Please remember that this is for non-urgent requests.  _______________________________________________________  Lori Mays have been scheduled for a CT scan of the abdomen and pelvis at [facility], 1st floor Radiology. You are scheduled on 09/13/2022  at 9:00 am. You should arrive 15 minutes prior to your appointment time for registration.  Please pick up 2 bottles of contrast from [facility] at least 3 days prior to your scan. The solution may taste better if refrigerated, but do NOT add ice or any other liquid to this solution. Shake well before drinking.   Please follow the written instructions below on the day of your exam:   1) Do not eat anything after 5:00 am (4 hours prior to your test)   2) Drink 1 bottle of contrast @ 7:00 am (2 hours prior to your exam)  Remember to shake well before drinking and do NOT pour over ice.     Drink 1 bottle of contrast @ 8:00 am (1 hour prior to your exam)   You may take any medications as prescribed with a small amount of water, if necessary. If you take any of the following medications: METFORMIN, GLUCOPHAGE, GLUCOVANCE, AVANDAMET, RIOMET, FORTAMET, [place] MET, JANUMET, GLUMETZA or METAGLIP, you MAY be asked to HOLD this medication 48 hours AFTER the exam.   The purpose of you drinking the oral contrast is to aid in the visualization of your intestinal  tract. The contrast solution may cause some diarrhea. Depending on your individual set of symptoms, you may also receive an intravenous injection of x-ray contrast/dye. Plan on being at [facility] for 45 minutes or longer, depending on the type of exam you are having performed.   If you have any questions regarding your exam or if you need to reschedule, you may call [facility] at [phone] between the hours of 8:00 am and 5:00 pm, Monday-Friday.   Reduce Linzess to 145 mcg 1 capsule every morning.. If this is still not working properly the reduce to 72 mcg 1 capsule every morning.  Please call the office to let us know which one works best for you so that we can send in the correct prescription.

## 2022-06-03 NOTE — Progress Notes (Signed)
Subjective:    Patient ID: Lori Mays, female    DOB: 07-Jun-1986, 36 y.o.   MRN: [id number]  HPI Lori Mays is a pleasant 36 year old African-American female, established with Dr. Christella HartiganJacobs.  She had been seen once previously in our office by Doug SouJessica Zehr, PA-C in May 2021.  At that time with chronic constipation, and had been started on Linzess 290 mcg daily. Patient comes in today with complaints of ongoing problems with significant constipation, and rather generalized abdominal pain. She says she continues to use Linzess but is unable to take this on a daily basis because after a couple of doses it will cause a complete bowel purge with significant diarrhea.  She has been using this as needed for a couple of days at a time and usually tries to take it on the weekends so that diarrhea will be a problem while she is working. She says this year she has had ongoing abdominal pain, fairly generalized and sometimes more prominent in the mid abdomen and left abdomen, worse over the past 3 months.  She gets random stabbing type pains in the mid and left abdomen as well with pain radiating around into her left back.  Generally some relief after a bowel movement. She does have diagnosis of stage III endometriosis, has been followed by Dr. Ned ClinesMolder.  She had been on treatment with Dewayne HatchOrilissa which she stopped in early February in anticipation of freezing her eggs at some point over the next few months.  She says she has not started hormone therapy yet, and has follow-up with GYN in early July. I can see an ultrasound report from 2021 which documented a 5.5 cm homogeneously hypoechoic round structure in the left adnexa suspicious for endometrioma, and a 6.1 cm homogeneous mass abutting the posterior uterus with characteristics consistent with endometrioma, cannot rule out adherent to the posterior uterine wall.    Review of Systems. Pertinent positive and negative review of systems were noted in the above HPI  section.  All other review of systems was otherwise negative.   Outpatient Encounter Medications as of 06/03/2022  Medication Sig   acetaminophen (TYLENOL) 500 MG tablet Take 2 tablets (1,000 mg total) by mouth every 6 (six) hours as needed.   ibuprofen (ADVIL) 800 MG tablet Take 1 tablet (800 mg total) by mouth every 8 (eight) hours as needed.   linaclotide (LINZESS) 145 MCG CAPS capsule Take 1 capsule (145 mcg total) by mouth daily before breakfast.   linaclotide (LINZESS) 72 MCG capsule Take 1 capsule (72 mcg total) by mouth daily before breakfast.   PROAIR HFA 108 (90 Base) MCG/ACT inhaler INHALE 1-2 PUFFS INTO THE LUNGS EVERY 6 (SIX) HOURS AS NEEDED FOR WHEEZING OR SHORTNESS OF BREATH.   [DISCONTINUED] LINZESS 290 MCG CAPS capsule TAKE 1 CAPSULE BY MOUTH DAILY BEFORE BREAKFAST.   [DISCONTINUED] diclofenac (VOLTAREN) 75 MG EC tablet Take 1 tablet (75 mg total) by mouth 2 (two) times daily with a meal. (Patient not taking: Reported on 06/03/2022)   No facility-administered encounter medications on file as of 06/03/2022.   Allergies  Allergen Reactions   Diphenhydramine Anaphylaxis    Asthma attacks Asthma attacks   Latex Itching   Patient Active Problem List   Diagnosis Date Noted   Chronic constipation 05/09/2020   History of DVT (deep vein thrombosis) 05/02/2020   Dysplasia of cervix, 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   Low grade squamous intraepith lesion on cytologic smear cervix (lgsil) 11/09/2019   Asthma, chronic 06/22/2013  Migraine, unspecified 06/22/2013   Arthralgia 06/13/2013   Endometriosis 06/13/2013   Edema of both legs 06/13/2013   Weight gain 06/13/2013   Social History   Socioeconomic History   Marital status: Single    Spouse name: Not on file   Number of children: Not on file   Years of education: Not on file   Highest education level: Some college, no degree  Occupational History   Not on file  Tobacco Use   Smoking status: Never   Smokeless tobacco: Never   Vaping Use   Vaping Use: Never used  Substance and Sexual Activity   Alcohol use: Not Currently    Alcohol/week: 1.0 - 2.0 standard drink    Types: 1 - 2 Glasses of wine per week    Comment: occassionally   Drug use: No   Sexual activity: Yes    Partners: Male    Birth control/protection: Condom  Other Topics Concern   Not on file  Social History Narrative   Not on file   Social Determinants of Health   Financial Resource Strain: Not on file  Food Insecurity: No Food Insecurity   Worried About Programme researcher, broadcasting/film/video in the Last Year: Never true   Ran Out of Food in the Last Year: Never true  Transportation Needs: Unmet Transportation Needs   Lack of Transportation (Medical): Yes   Lack of Transportation (Non-Medical): Yes  Physical Activity: Not on file  Stress: Not on file  Social Connections: Not on file  Intimate Partner Violence: Not on file    Ms. Desanto's family history includes Brain cancer in her paternal grandmother; Breast cancer in her maternal grandmother, paternal aunt, and paternal grandmother.      Objective:    Vitals:   06/03/22 1543  BP: 92/66  Pulse: 60    Physical Exam Well-developed well-nourished young African-American female in no acute distress.  Pleasant Weight, 162 BMI 23.9  HEENT; nontraumatic normocephalic, EOMI, PE R LA, sclera anicteric. Oropharynx; not examined today Neck; supple, no JVD Cardiovascular; regular rate and rhythm with S1-S2, no murmur rub or gallop Pulmonary; Clear bilaterally Abdomen; soft, mild fairly generalized tenderness, more prominent in the left upper left mid left lower abdomen,, no palpable mass or hepatosplenomegaly, bowel sounds are active Rectal; not done today Skin; benign exam, no jaundice rash or appreciable lesions Extremities; no clubbing cyanosis or edema skin warm and dry Neuro/Psych; alert and oriented x4, grossly nonfocal mood and affect appropriate        Assessment & Plan:   #25  36 year old African-American female with history of stage III endometriosis, for which she had been on treatment with Dewayne Hatch, previously documented endometriomas on pelvic ultrasounds 2021. Patient presents now with worsening abdominal pain rather generalized but more prominent in the left abdomen and radiating around into the left back over the past 3 months.  She has been off treatment with Dewayne Hatch since early February in anticipation of undergoing procedure to freeze her eggs.  I suspect her current pain is secondary to worsening of endometriosis off therapy  Patient is concerned about bowel involvement with endometriosis.  #2 chronic constipation-patient has a prescription for Linzess 290 mcg daily which she is currently using as needed as this will generally cause severe episodes of diarrhea/bowel purge. She does get some relief of her abdominal pain with bowel movements.  Again I expect that endometriosis is playing a role in the chronic constipation, cannot rule out colonic involvement, versus constipation secondary to scarring/adhesions secondary  to endometriosis.  #2 prior history of DVT #3 asthma #4.  Migraines  Plan; we will give her samples today of lower dose Linzess/72 mcg and 145 mcg.  She can trial these and see if she has a better result with lower dose which she could take on a daily basis, and hopefully avoid severe diarrhea If she finds 1 of these doses to be more effective, she will call back in the next 2 weeks and we will change her prescription  , Will schedule for CT of the abdomen and pelvis with contrast Further recommendations pending results of CT He has upcoming follow-up with GYN early July-and is trying to avoid restarting Orilissa until she can proceed with egg retrieval  Kento Gossman Oswald Hillock PA-C 06/03/2022   Cc: Claiborne Rigg, NP

## 2022-06-09 ENCOUNTER — Ambulatory Visit: Payer: BC Managed Care – PPO | Attending: Student | Admitting: Physical Therapy

## 2022-06-09 ENCOUNTER — Telehealth: Payer: Self-pay | Admitting: Physical Therapy

## 2022-06-09 DIAGNOSIS — R102 Pelvic and perineal pain: Secondary | ICD-10-CM | POA: Insufficient documentation

## 2022-06-09 DIAGNOSIS — R252 Cramp and spasm: Secondary | ICD-10-CM | POA: Insufficient documentation

## 2022-06-09 DIAGNOSIS — M6281 Muscle weakness (generalized): Secondary | ICD-10-CM | POA: Insufficient documentation

## 2022-06-09 NOTE — Telephone Encounter (Signed)
Called patient about her missed appointment today at 16:15. She was still at work and forgot about her appointment. Therapist reminded her of her next appointment.  Lori Mays, PT @6 /11/2022@ 4:53 PM

## 2022-06-13 ENCOUNTER — Ambulatory Visit (HOSPITAL_COMMUNITY)
Admission: RE | Admit: 2022-06-13 | Discharge: 2022-06-13 | Disposition: A | Discharge status: Home / Self Care | Payer: BC Managed Care – PPO | Source: Ambulatory Visit | Attending: Physician Assistant | Admitting: Physician Assistant

## 2022-06-13 ENCOUNTER — Encounter (HOSPITAL_COMMUNITY): Payer: Self-pay

## 2022-06-13 DIAGNOSIS — N809 Endometriosis, unspecified: Secondary | ICD-10-CM

## 2022-06-13 DIAGNOSIS — K5900 Constipation, unspecified: Secondary | ICD-10-CM | POA: Diagnosis not present

## 2022-06-13 DIAGNOSIS — R1084 Generalized abdominal pain: Secondary | ICD-10-CM

## 2022-06-13 DIAGNOSIS — Z8742 Personal history of other diseases of the female genital tract: Secondary | ICD-10-CM | POA: Diagnosis not present

## 2022-06-13 DIAGNOSIS — K5909 Other constipation: Secondary | ICD-10-CM

## 2022-06-13 DIAGNOSIS — R109 Unspecified abdominal pain: Secondary | ICD-10-CM

## 2022-06-13 MED ORDER — IOHEXOL 300 MG/ML  SOLN
100.0000 mL | Freq: Once | INTRAMUSCULAR | Status: AC | PRN
  Administered 2022-06-13: 100 mL via INTRAVENOUS

## 2022-06-13 MED ORDER — SODIUM CHLORIDE (PF) 0.9 % IJ SOLN
INTRAMUSCULAR | Status: AC
  Filled 2022-06-13: qty 50

## 2022-06-16 ENCOUNTER — Encounter: Payer: Self-pay | Admitting: Physical Therapy

## 2022-06-16 ENCOUNTER — Ambulatory Visit: Payer: BC Managed Care – PPO | Admitting: Physical Therapy

## 2022-06-16 DIAGNOSIS — R252 Cramp and spasm: Secondary | ICD-10-CM | POA: Diagnosis not present

## 2022-06-16 DIAGNOSIS — R102 Pelvic and perineal pain: Secondary | ICD-10-CM | POA: Diagnosis not present

## 2022-06-16 DIAGNOSIS — M6281 Muscle weakness (generalized): Secondary | ICD-10-CM

## 2022-06-16 NOTE — Progress Notes (Signed)
I agree with the above note, plan 

## 2022-06-16 NOTE — Therapy (Signed)
[facility] @ [place] [address] [place], [place], [address] Phone: [phone]   Fax:  [phone]  Physical Therapy Treatment  Patient Details  Name: Lori Mays MRN: [id number] Date of Birth: Jul 29, 1986 Referring Provider (PT): Dr. Carmelina Noun   Encounter Date: 06/16/2022   PT End of Session - 06/16/22 1617     Visit Number 19    Date for PT Re-Evaluation 06/28/22    Authorization Type [employer] Medicaid    Authorization Time Period 4/12-7/4    Authorization - Visit Number 4    Authorization - Number of Visits 12    PT Start Time 1615    PT Stop Time 1655    PT Time Calculation (min) 40 min    Activity Tolerance Patient tolerated treatment well    Behavior During Therapy [facility] for tasks assessed/performed             Past Medical History:  Diagnosis Date   ASCUS with positive high risk HPV 06/16/2013   Asthma    Cyst 06/07/2012   DVT, lower extremity, proximal (HCC)    Endometriosis     Past Surgical History:  Procedure Laterality Date   laproscopy  2011    There were no vitals filed for this visit.   Subjective Assessment - 06/16/22 1618     Subjective I am having alot of discomfort today. I had a CT scan on friday. I am having trouble going to the bathroom. I had to take a laxative last night. Burning down legs. Patient has not eaten since Saturday.    Patient Stated Goals reduce pain    Currently in Pain? Yes    Pain Score 7     Pain Location Abdomen    Pain Orientation Right;Left    Pain Descriptors / Indicators Aching;Cramping;Sharp;Throbbing    Pain Type Chronic pain    Pain Onset More than a month ago    Pain Frequency Constant    Aggravating Factors  random pain, due to not going to the bathroom    Pain Relieving Factors topical ointment, abdominal massage, stretches, medittation, over the counter, pain medication    Multiple Pain Sites No                                [facility] Adult PT Treatment/Exercise - 06/16/22 0001       Manual Therapy   Manual Therapy Soft tissue mobilization;Myofascial release    Soft tissue mobilization circular motion around the abdomen to improve peristalic motion of the interstines, manual work to the diaphramg    Myofascial Release fascial release of the mesenteric root, along the sac of Douglas, around the umbilicus, lifting the intestines off the bladder; quadruped pulling the abdominal tissue off the back and to release all of the restrictions.                       PT Short Term Goals - 03/05/22 1717       PT SHORT TERM GOAL #1   Title independent with initial HEP for meditation, stretches, and diaphragmatic breathing to relax the pelvic floor    Time 4    Period Weeks    Status Achieved    Target Date 09/04/21      PT SHORT TERM GOAL #2   Title understand how to perform manual abdominal massage to assist in peristalic motion of the intestines  for easier bowel movements    Time 4    Period Weeks    Status Achieved    Target Date 09/04/21      PT SHORT TERM GOAL #3   Title understand different ways to manage pain with down regulating her nervous system    Time 4    Period Weeks    Status Achieved               PT Long Term Goals - 06/16/22 1700       PT LONG TERM GOAL #1   Title independent with advanced HEP with pain management, stretches, gentle strengthening and self massage techniques    Baseline HEP is updated as she progresses    Time 4    Period Weeks    Status On-going    Target Date 04/09/22      PT LONG TERM GOAL #2   Title able to allow the therapisr perform internal manual therapy and also have patient perform on herself to reduce trigger points of the pelvic floor to manage her pain    Baseline patient is using her hand to work on the trigger points in the pelvic floor    Time 4    Period Months    Status On-going    Target Date 04/09/22      PT LONG TERM GOAL #3    Title Patient able to toilet correctly and reduce straining by 50% due to elongation of the pelvic floor muscles    Baseline stain to have a bowel movement has improved by 20% , type 1 every couple of days and and actual bowel movement is 1 time per week    Time 4    Period Months    Status On-going      PT LONG TERM GOAL #4   Title pelvic and abdominal pain at [facility] level </= 4/10 50% of the day due to reduction of trigger points    Baseline pain level 5-8/10, right now she is not on birth control so she is going through the hormonal shifts that cause pelvic pain    Period Months    Status On-going      PT LONG TERM GOAL #5   Title ability to urinate without double voiding due to relaxation of the pelvic floor muscles during urination    Baseline has to void 3 times and bear down to fully empty her bladder    Time 4    Period Months    Status On-going                   Plan - 06/16/22 1657     Clinical Impression Statement Patient pain decreased to 2/10 after manual work. Her abdomen was less bloated after the manual work. She had good bowel sounds with the manual work. Patient was advised to call her doctor with her GI symptoms to see if they can help. Patient has not eaten due to the pain and bloating with difficulty having a bowel movement. Patient will benefit from skilled therapy to improve tissue elongation and work on mobility.    Personal Factors and Comorbidities Fitness;Comorbidity 1;Sex;Past/Current Experience;Time since onset of injury/illness/exacerbation    Comorbidities endometriosis    Examination-Activity Limitations Locomotion Level;Lift;Stand;Stairs;Sit    Examination-Participation Restrictions Cleaning;Meal Prep;Occupation;Community Activity;Driving;Interpersonal Relationship;Laundry    Stability/Clinical Decision Making Evolving/Moderate complexity    Rehab Potential Good    PT Frequency 1x / week    PT Duration 12 weeks  PT Treatment/Interventions  ADLs/Self Care Home Management;Biofeedback;Cryotherapy;Moist Heat;Electrical Stimulation;Therapeutic activities;Therapeutic exercise;Patient/family education;Manual techniques;Dry needling;Spinal Manipulations    PT Next Visit Plan fascial release of the upper abdomen, pelvic floor decompression in prone, see how the stretches are going, internal work; write renewal    PT Home Exercise Plan Access Code: [email or web address]    Consulted and Agree with Plan of Care Patient             Patient will benefit from skilled therapeutic intervention in order to improve the following deficits and impairments:  Decreased coordination, Increased fascial restricitons, Impaired tone, Decreased endurance, Increased muscle spasms, Decreased activity tolerance, Pain, Decreased strength  Visit Diagnosis: Muscle weakness (generalized)  Pelvic pain  Cramp and spasm     Problem List Patient Active Problem List   Diagnosis Date Noted   Chronic constipation 05/09/2020   History of DVT (deep vein thrombosis) 05/02/2020   Dysplasia of cervix, 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   Low grade squamous intraepith lesion on cytologic smear cervix (lgsil) 11/09/2019   Asthma, chronic 06/22/2013   Migraine, unspecified 06/22/2013   Arthralgia 06/13/2013   Endometriosis 06/13/2013   Edema of both legs 06/13/2013   Weight gain 06/13/2013    Earlie Counts, PT 06/16/22 5:01 PM  [facility] @ [place], [place], [address] Phone: [phone]   Fax:  [phone]  Name: Lori Mays MRN: [id number] Date of Birth: 1986/09/07

## 2022-06-25 ENCOUNTER — Ambulatory Visit: Payer: BC Managed Care – PPO | Admitting: Physical Therapy

## 2022-06-25 ENCOUNTER — Encounter: Payer: Self-pay | Admitting: Physical Therapy

## 2022-06-25 DIAGNOSIS — M6281 Muscle weakness (generalized): Secondary | ICD-10-CM | POA: Diagnosis not present

## 2022-06-25 DIAGNOSIS — R252 Cramp and spasm: Secondary | ICD-10-CM | POA: Diagnosis not present

## 2022-06-25 DIAGNOSIS — R102 Pelvic and perineal pain: Secondary | ICD-10-CM

## 2022-06-25 NOTE — Therapy (Signed)
[facility] @ [place], [place], [address] Phone: [phone]   Fax:  [phone]  Physical Therapy Treatment  Patient Details  Name: Lori Mays MRN: [id number] Date of Birth: 07/24/1986 Referring Provider (PT): Dr. Maceo Pro   Encounter Date: 06/25/2022   PT End of Session - 06/25/22 1656     Visit Number 20    Date for PT Re-Evaluation 06/28/22    Authorization Type [employer] Medicaid    Authorization Time Period 4/12-7/4    Authorization - Visit Number 5    Authorization - Number of Visits 12    PT Start Time 3382    PT Stop Time 1655    PT Time Calculation (min) 40 min    Activity Tolerance Patient tolerated treatment well    Behavior During Therapy [facility] for tasks assessed/performed             Past Medical History:  Diagnosis Date   ASCUS with positive high risk HPV 06/16/2013   Asthma    Cyst 06/07/2012   DVT, lower extremity, proximal ([place])    Endometriosis     Past Surgical History:  Procedure Laterality Date   laproscopy  2011    There were no vitals filed for this visit.   Subjective Assessment - 06/25/22 1620     Subjective I went to [place] over the weekend. I had more fatique and swelling in my legs.    Patient Stated Goals reduce pain    Currently in Pain? 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 Maceo Pro    Prior Therapy none       Precautions   Precautions None      [place] residence      Prior Function   Level of Independence Independent    Vocation Full time employment    Vocation Requirements sitting at computer    Leisure no exercise      Cognition   Overall Cognitive Status Within Functional Limits for tasks assessed      Strength   Right Hip Flexion 4/5    Right Hip Extension 4+/5    Right Hip External Rotation  5/5    Right Hip Internal Rotation 5/5    Right Hip ABduction 4/5    Right Hip ADduction 4/5    Left Hip Flexion 4/5    Left Hip Extension 4/5    Left Hip External Rotation 5/5    Left Hip Internal Rotation 5/5    Left Hip ABduction 4/5    Left Hip ADduction 4/5                        Pelvic Floor Special Questions - 06/25/22 0001  External Palpation negative Q-tip test    Strength weak squeeze, no lift               [facility] Adult PT Treatment/Exercise - 06/25/22 0001       Self-Care   Self-Care Other Self-Care Comments    Other Self-Care Comments  educated patient on using compression hose when she travels in a car or plane for long distances.      Manual Therapy   Manual Therapy Myofascial release    Myofascial Release pelvic decompression in prone going through the fascial restrictions    Manual Lymphatic Drainage (MLD) to the neck, armpit, along the abdomen, the upper thigh, inner knee, and pumping action of the lower leg to reduce the swelling and educated patient on how to perfrom at home                       PT Short Term Goals - 06/25/22 1703       PT SHORT TERM GOAL #1   Title independent with initial HEP for meditation, stretches, and diaphragmatic breathing to relax the pelvic floor    Time 4    Period Weeks    Status Achieved    Target Date 09/04/21      PT SHORT TERM GOAL #2   Title understand how to perform manual abdominal massage to assist in peristalic motion of the intestines for  easier bowel movements    Time 4    Period Weeks    Status Achieved    Target Date 09/04/21      PT SHORT TERM GOAL #3   Title understand different ways to manage pain with down regulating her nervous system    Period Weeks    Status Achieved    Target Date 09/04/21               PT Long Term Goals - 06/25/22 1703       PT LONG TERM GOAL #1   Title independent with advanced HEP with pain management, stretches, gentle strengthening and self massage techniques    Time 4    Period Weeks    Status Achieved      PT LONG TERM GOAL #2   Title able to allow the therapisr perform internal manual therapy and also have patient perform on herself to reduce trigger points of the pelvic floor to manage her pain    Time 4    Period Months    Status Achieved      PT LONG TERM GOAL #3   Title Patient able to toilet correctly and reduce straining by 50% due to elongation of the pelvic floor muscles    Time 4    Period Months    Status Achieved      PT LONG TERM GOAL #4   Title pelvic and abdominal pain at [facility] level </= 4/10 50% of the day due to reduction of trigger points    Baseline pain level 5-8/10, right now she is not on birth control so she is going through the hormonal shifts that cause pelvic pain    Time 4    Period Months    Status Partially Met      PT LONG TERM GOAL #5   Title ability to urinate without double voiding due to relaxation of the pelvic floor muscles during urination    Time 4    Period Months    Status Achieved  Plan - 06/25/22 1657     Clinical Impression Statement Patient is able to manage her pain better. She is taking less of Ibuprofen for her pain. Her pain with her menstrual cycles are not as bad and she does not have to take off of work from them. Patient had less swelling in the abdomen and legs after the manual lymph drainage work. Patient continues to have weakness in her hips.    Personal Factors and  Comorbidities Fitness;Comorbidity 1;Sex;Past/Current Experience;Time since onset of injury/illness/exacerbation    Comorbidities endometriosis    Examination-Activity Limitations Locomotion Level;Lift;Stand;Stairs;Sit    Examination-Participation Restrictions Cleaning;Meal Prep;Occupation;Community Activity;Driving;Interpersonal Relationship;Laundry    Stability/Clinical Decision Making Evolving/Moderate complexity    Rehab Potential Good    PT Treatment/Interventions ADLs/Self Care Home Management;Biofeedback;Cryotherapy;Moist Heat;Electrical Stimulation;Therapeutic activities;Therapeutic exercise;Patient/family education;Manual techniques;Dry needling;Spinal Manipulations    PT Next Visit Plan Discharge to HEP    PT Home Exercise Plan Access Code: [email or web address]    Consulted and Agree with Plan of Care Patient             Patient will benefit from skilled therapeutic intervention in order to improve the following deficits and impairments:  Decreased coordination, Increased fascial restricitons, Impaired tone, Decreased endurance, Increased muscle spasms, Decreased activity tolerance, Pain, Decreased strength  Visit Diagnosis: Muscle weakness (generalized)  Pelvic pain  Cramp and spasm     Problem List Patient Active Problem List   Diagnosis Date Noted   Chronic constipation 05/09/2020   History of DVT (deep vein thrombosis) 05/02/2020   Dysplasia of cervix, 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   Low grade squamous intraepith lesion on cytologic smear cervix (lgsil) 11/09/2019   Asthma, chronic 06/22/2013   Migraine, unspecified 06/22/2013   Arthralgia 06/13/2013   Endometriosis 06/13/2013   Edema of both legs 06/13/2013   Weight gain 06/13/2013    Earlie Counts, PT 06/25/22 5:05 PM  [facility] @ [place], [place], [address] Phone: [phone]   Fax:  [phone]  Name: Lori Mays MRN: [id number] Date of  Birth: 06-15-1986  PHYSICAL THERAPY DISCHARGE SUMMARY  Visits from Start of Care: 12  Current functional level related to goals / functional outcomes: See above.    Remaining deficits: See above.    Education / Equipment: HEP   Patient agrees to discharge. Patient goals were met. Patient is being discharged due to meeting the stated rehab goals. Thank you for the referral.  Earlie Counts, PT 06/25/22 5:05 PM

## 2022-06-26 ENCOUNTER — Telehealth: Payer: Self-pay

## 2022-06-26 NOTE — Telephone Encounter (Signed)
Follow up phone call about Linzess. The patient reports the Linzess 72 mcg was not effective. The Linzess 145 mcg was better, but still has to strain hard to have a bowel movement. States Linzess 290 mcg was not a good fit for her and that is why she was given the Linzess 72 mcg and 145 mcg to try. She is not on any stool softeners or stimulants. Please advise.

## 2022-06-30 ENCOUNTER — Other Ambulatory Visit: Payer: Self-pay | Admitting: Physician Assistant

## 2022-06-30 MED ORDER — LINACLOTIDE 145 MCG PO CAPS: 145.0000 ug | ORAL_CAPSULE | Freq: Every day | ORAL | 8 refills | Status: DC

## 2022-06-30 NOTE — Telephone Encounter (Signed)
Called and spoke with patient regarding Amy Esterwood's recommendations. Pt would like RX for Linzess 145 mcg sent [facility] her [facility] on [facility]. Pt would like recommendations sent to her MyChart. Pt verbalized understanding of all information and had no concerns at the end of the cal.  RX [facility] Linzess [facility]cg [facility]. MyChart message sent to patient with recommendations.

## 2022-07-03 ENCOUNTER — Ambulatory Visit: Payer: BC Managed Care – PPO | Admitting: Family Medicine

## 2022-07-07 ENCOUNTER — Telehealth: Payer: Self-pay | Admitting: Pharmacy Technician

## 2022-07-07 ENCOUNTER — Other Ambulatory Visit (HOSPITAL_COMMUNITY): Payer: Self-pay

## 2022-07-07 NOTE — Telephone Encounter (Signed)
Patient Advocate Encounter   Received notification from "Pharmacy Messages" that prior authorization for Linzess is required by his/her insurance [employer].  One of the PA questions did ask if the pt had tried and failed or had a contraindication to Trulance. Per test claim, it als will need a PA, If it has to be tried prior to Linzess.    PA submitted on 07/07/22 Key [id number] Status is pending    [facility] will continue to follow:  Patient Advocate Fax:  [phone]

## 2022-07-14 NOTE — Telephone Encounter (Signed)
Received a fax regarding Prior Authorization for [employer]. Authorization has been DENIED because patient must try/fail Trulance first.  Burnell Blanks, CPhT Pharmacy Patient Advocate Specialist [facility] Patient Advocate Team Phone: [phone]   Fax: [phone]

## 2022-07-17 ENCOUNTER — Encounter: Payer: Self-pay | Admitting: Obstetrics and Gynecology

## 2022-07-17 NOTE — Progress Notes (Signed)
   ANNUAL EXAM Patient name: Lori Mays MRN [id number]  Date of birth: 04/18/1986 Chief Complaint:   Gynecologic Exam  History of Present Illness:   Lori Mays is a 36 y.o. G0P0000 female being seen today for a routine annual exam.   Current complaints: Has some vaginal discharge and had BV in past. Would like to check that this is not still ongoing.   She has a h/o endometriosis. Going through IVF soon.   Patient's last menstrual period was 07/21/2022 (exact date).  Last pap: 03/2021. Results were: NILM w/ HRHPV negative. 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  Pertinent History Reviewed:  Reviewed past medical,surgical, social and family history.  Reviewed problem list, medications and allergies. Physical Assessment:   Vitals:   07/21/22 1448  BP: 111/72  Pulse: 61  Weight: 166 lb (75.3 kg)  Height: 5\' 9"  (1.753 m)  Body mass index is 24.51 kg/m.   Physical Examination:  General appearance - well appearing, and in no distress Mental status - alert, oriented to person, place, and time Psych:  She has a normal mood and affect Skin - warm and dry, normal color, no suspicious lesions noted Chest - effort normal Heart - normal rate  Breasts - breasts appear normal, no suspicious masses, no skin or nipple changes or axillary nodes Abdomen - soft, nontender, nondistended, no masses or organomegaly Pelvic -  VULVA: normal appearing vulva with no masses, tenderness or lesions  VAGINA: normal appearing vagina with normal color and discharge, no lesions  CERVIX: normal appearing cervix  without discharge or lesions, no CMT UTERUS: uterus is felt to be normal size, shape, consistency and nontender  ADNEXA: No adnexal masses or tenderness noted. Extremities:  No swelling or varicosities noted  Chaperone present for exam  No results found for this or any previous visit (from the past 24 hour(s)).  Assessment & Plan:  [name] was seen today for gynecologic exam.  Diagnoses and all orders for this visit:  Encounter for annual routine gynecological examination -     Cytology - PAP( [facility]) - Cervical cancer screening: Discussed guidelines. Pap with HPV done in f/u from [facility]ep in 2021.  - Gardasil: She thinks she had this in her 1s - GC/CT: not indicated - Breast Health: Encouraged self breast awareness/SBE. Teaching provided.  - F/U 12 months and prn  Vaginal discharge -     metroNIDAZOLE (FLAGYL) 500 MG tablet; Take 1 tablet (500 mg total) by mouth 2 (two) times daily.  No orders of the defined types were placed in this encounter.   Meds:  Meds ordered this encounter  Medications   metroNIDAZOLE (FLAGYL) 500 MG tablet    Sig: Take 1 tablet (500 mg total) by mouth 2 (two) times daily.    Dispense:  14 tablet    Refill:  0    Follow-up: Return in about 1 year (around 07/22/2023) for annual.  07/24/2023, MD 07/21/2022 3:15 PM

## 2022-07-21 ENCOUNTER — Encounter: Payer: Self-pay | Admitting: Obstetrics and Gynecology

## 2022-07-21 ENCOUNTER — Other Ambulatory Visit (HOSPITAL_COMMUNITY)
Admission: RE | Admit: 2022-07-21 | Discharge: 2022-07-21 | Disposition: A | Discharge status: Home / Self Care | Payer: BC Managed Care – PPO | Source: Ambulatory Visit | Attending: Family Medicine | Admitting: Family Medicine

## 2022-07-21 ENCOUNTER — Ambulatory Visit (INDEPENDENT_AMBULATORY_CARE_PROVIDER_SITE_OTHER): Payer: BC Managed Care – PPO | Admitting: Obstetrics and Gynecology

## 2022-07-21 VITALS — BP 111/72 | HR 61 | Ht 69.0 in | Wt 166.0 lb

## 2022-07-21 DIAGNOSIS — Z01419 Encounter for gynecological examination (general) (routine) without abnormal findings: Secondary | ICD-10-CM

## 2022-07-21 DIAGNOSIS — N898 Other specified noninflammatory disorders of vagina: Secondary | ICD-10-CM | POA: Insufficient documentation

## 2022-07-21 MED ORDER — METRONIDAZOLE 500 MG PO TABS: 500.0000 mg | ORAL_TABLET | Freq: Two times a day (BID) | ORAL | 0 refills | Status: DC

## 2022-07-21 NOTE — Addendum Note (Signed)
Addended by: Kathee Delton on: 07/21/2022 03:18 PM   Modules accepted: Orders

## 2022-07-22 ENCOUNTER — Encounter: Payer: Self-pay | Admitting: Obstetrics and Gynecology

## 2022-07-22 LAB — CERVICOVAGINAL ANCILLARY ONLY
Bacterial Vaginitis (gardnerella): NEGATIVE
Candida Glabrata: NEGATIVE
Candida Vaginitis: NEGATIVE
Comment: NEGATIVE
Comment: NEGATIVE
Comment: NEGATIVE

## 2022-07-24 DIAGNOSIS — Z119 Encounter for screening for infectious and parasitic diseases, unspecified: Secondary | ICD-10-CM | POA: Diagnosis not present

## 2022-07-24 DIAGNOSIS — Z3141 Encounter for fertility testing: Secondary | ICD-10-CM | POA: Diagnosis not present

## 2022-07-25 LAB — CYTOLOGY - PAP
Comment: NEGATIVE
Diagnosis: NEGATIVE
High risk HPV: NEGATIVE

## 2022-07-27 ENCOUNTER — Other Ambulatory Visit: Payer: Self-pay | Admitting: Obstetrics & Gynecology

## 2022-07-27 DIAGNOSIS — N809 Endometriosis, unspecified: Secondary | ICD-10-CM

## 2022-08-26 ENCOUNTER — Encounter: Payer: Self-pay | Admitting: Obstetrics and Gynecology

## 2022-08-29 ENCOUNTER — Other Ambulatory Visit: Payer: Self-pay

## 2022-08-29 ENCOUNTER — Other Ambulatory Visit: Payer: Self-pay | Admitting: Physician Assistant

## 2022-08-29 MED ORDER — LUBIPROSTONE 8 MCG PO CAPS: 8.0000 ug | ORAL_CAPSULE | Freq: Two times a day (BID) | ORAL | 0 refills | Status: DC

## 2022-08-29 NOTE — Telephone Encounter (Signed)
Please submit PA.

## 2022-09-03 ENCOUNTER — Telehealth: Payer: Self-pay | Admitting: Pharmacy Technician

## 2022-09-03 ENCOUNTER — Telehealth: Payer: Self-pay

## 2022-09-03 ENCOUNTER — Other Ambulatory Visit (HOSPITAL_COMMUNITY): Payer: Self-pay

## 2022-09-03 NOTE — Telephone Encounter (Signed)
PA has been submitted and telephone encounter has been created. Trulance try/fail was a question asked as before. Pt may have to trial this, even with documentation of samples before insurance will pay. Will continue to follow and update when decision has been made.

## 2022-09-03 NOTE — Telephone Encounter (Signed)
Patient Advocate Encounter  Received notification that prior authorization for LUBIPROSTONE is required.   PA submitted on 9.6.23 Key [id number] Status is pending    Ricke Hey, CPhT Patient Advocate Phone: [phone]

## 2022-09-03 NOTE — Telephone Encounter (Signed)
Patient Advocate Encounter   Prior authorization is required for Lubiprostone  Submitted: 09/03/2022 Key [id number]  Burnell Blanks, CPhT Rx Patient Advocate Phone: [phone]

## 2022-09-03 NOTE — Telephone Encounter (Signed)
Patient has Chronic Constipation.

## 2022-09-04 NOTE — Telephone Encounter (Signed)
Do not see that patient has tried Trulance.

## 2022-09-04 NOTE — Telephone Encounter (Signed)
Patient Advocate Encounter  Prior Authorization for Lubiprostone has been approved.   Effective: 09/03/2022 to 09/02/2023  Burnell Blanks, CPhT Rx Patient Advocate Phone: [phone]

## 2022-09-12 ENCOUNTER — Other Ambulatory Visit: Payer: Self-pay | Admitting: Obstetrics & Gynecology

## 2022-09-12 DIAGNOSIS — N809 Endometriosis, unspecified: Secondary | ICD-10-CM

## 2022-11-04 ENCOUNTER — Other Ambulatory Visit: Payer: Self-pay | Admitting: Obstetrics & Gynecology

## 2022-11-04 DIAGNOSIS — N809 Endometriosis, unspecified: Secondary | ICD-10-CM

## 2022-11-11 ENCOUNTER — Other Ambulatory Visit: Payer: Self-pay

## 2022-11-11 ENCOUNTER — Ambulatory Visit (INDEPENDENT_AMBULATORY_CARE_PROVIDER_SITE_OTHER): Payer: BC Managed Care – PPO

## 2022-11-11 ENCOUNTER — Other Ambulatory Visit (HOSPITAL_COMMUNITY)
Admission: RE | Admit: 2022-11-11 | Discharge: 2022-11-11 | Disposition: A | Discharge status: Home / Self Care | Payer: BC Managed Care – PPO | Source: Ambulatory Visit | Attending: Family Medicine | Admitting: Family Medicine

## 2022-11-11 DIAGNOSIS — N898 Other specified noninflammatory disorders of vagina: Secondary | ICD-10-CM

## 2022-11-11 LAB — POCT PREGNANCY, URINE: Preg Test, Ur: NEGATIVE

## 2022-11-11 NOTE — Progress Notes (Signed)
Pt presents today with complaints of vaginal discharge, itching and no menstrual cycle since August. Pt educated in how to perform self swab. Swab obtained. Pt states she has endometriosis and does not know if that is contributing to her lack of cycle or possible pregnancy. Pt states she did home UPT a few weeks ago and it was negative. UPT today is negative. Pt informed she would be notified of self swab results within 24-48 hours via MyChart. Pt verbalized understanding and denied further questions.

## 2022-11-12 LAB — CERVICOVAGINAL ANCILLARY ONLY
Bacterial Vaginitis (gardnerella): NEGATIVE
Candida Glabrata: NEGATIVE
Candida Vaginitis: NEGATIVE
Chlamydia: NEGATIVE
Comment: NEGATIVE
Comment: NEGATIVE
Comment: NEGATIVE
Comment: NEGATIVE
Comment: NEGATIVE
Comment: NORMAL
Neisseria Gonorrhea: NEGATIVE
Trichomonas: NEGATIVE

## 2023-01-20 DIAGNOSIS — N80123 Deep endometriosis of bilateral ovaries: Secondary | ICD-10-CM | POA: Diagnosis not present

## 2023-01-20 DIAGNOSIS — N809 Endometriosis, unspecified: Secondary | ICD-10-CM | POA: Diagnosis not present

## 2023-01-26 DIAGNOSIS — N736 Female pelvic peritoneal adhesions (postinfective): Secondary | ICD-10-CM | POA: Diagnosis not present

## 2023-01-26 DIAGNOSIS — N809 Endometriosis, unspecified: Secondary | ICD-10-CM | POA: Diagnosis not present

## 2023-01-26 DIAGNOSIS — N946 Dysmenorrhea, unspecified: Secondary | ICD-10-CM | POA: Diagnosis not present

## 2023-02-17 ENCOUNTER — Other Ambulatory Visit (HOSPITAL_COMMUNITY)
Admission: RE | Admit: 2023-02-17 | Discharge: 2023-02-17 | Disposition: A | Discharge status: Home / Self Care | Payer: BC Managed Care – PPO | Source: Ambulatory Visit | Attending: Family Medicine | Admitting: Family Medicine

## 2023-02-17 ENCOUNTER — Ambulatory Visit (INDEPENDENT_AMBULATORY_CARE_PROVIDER_SITE_OTHER): Payer: BC Managed Care – PPO

## 2023-02-17 ENCOUNTER — Other Ambulatory Visit: Payer: Self-pay

## 2023-02-17 VITALS — BP 115/66 | HR 63 | Wt 158.3 lb

## 2023-02-17 DIAGNOSIS — R109 Unspecified abdominal pain: Secondary | ICD-10-CM

## 2023-02-17 DIAGNOSIS — N898 Other specified noninflammatory disorders of vagina: Secondary | ICD-10-CM

## 2023-02-17 DIAGNOSIS — K5909 Other constipation: Secondary | ICD-10-CM

## 2023-02-17 DIAGNOSIS — M5432 Sciatica, left side: Secondary | ICD-10-CM

## 2023-02-17 NOTE — Progress Notes (Signed)
Lori Mays is here with concern of vaginal odor present for past few days. Also reports abdominal cramping. States cramping is likely due to worsening constipation and endometriosis of bowel. However, pt would like to rule out any infection as source of pain and vaginal odor. Self swab instructions given and specimen obtained. Explained patient will be contacted with any abnormal results.   Pt reports pain in left hip that radiates to lower back and down left leg. Also reports bilateral lower edema, mild pitting edema BLL present today. States these symptoms are aggravated by long periods of siting at work. Pt has seen pelvic PT in past. Offered updated referral if patient would like to follow up with Earlie Counts, PT regarding hip pain and worsening constipation; pt desires PT referral. Also offered for patient to follow up with PCP if PT cannot address concerns fully.  Patient will return for annual exam July 2024 or as needed.   Annabell Howells, RN 02/17/2023  9:36 AM

## 2023-02-18 ENCOUNTER — Ambulatory Visit: Payer: Self-pay

## 2023-02-18 LAB — CERVICOVAGINAL ANCILLARY ONLY
Bacterial Vaginitis (gardnerella): POSITIVE — AB
Candida Glabrata: NEGATIVE
Candida Vaginitis: POSITIVE — AB
Chlamydia: NEGATIVE
Comment: NEGATIVE
Comment: NEGATIVE
Comment: NEGATIVE
Comment: NEGATIVE
Comment: NEGATIVE
Comment: NORMAL
Neisseria Gonorrhea: NEGATIVE
Trichomonas: NEGATIVE

## 2023-02-18 MED ORDER — METRONIDAZOLE 500 MG PO TABS: 500.0000 mg | ORAL_TABLET | Freq: Two times a day (BID) | ORAL | 0 refills | Status: DC

## 2023-02-18 MED ORDER — FLUCONAZOLE 150 MG PO TABS: 150.0000 mg | ORAL_TABLET | Freq: Every day | ORAL | 2 refills | Status: DC

## 2023-02-18 NOTE — Addendum Note (Signed)
Addended by: Donnamae Jude on: 02/18/2023 01:08 PM   Modules accepted: Orders

## 2023-02-20 NOTE — Therapy (Deleted)
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  ONSET DATE: ***  SUBJECTIVE:                                                                                                                                                                                           SUBJECTIVE STATEMENT: ***  PERTINENT HISTORY:  ***  PAIN:  Are you having pain? {OPRCPAIN:27236}  PRECAUTIONS: {Therapy precautions:24002}  WEIGHT BEARING RESTRICTIONS: {Yes ***/No:24003}  FALLS:  Has patient fallen in last 6 months? {fallsyesno:27318}  LIVING ENVIRONMENT: Lives with: {OPRC lives with:25569::"lives with their family"} Lives in: {Lives in:25570} Stairs: {opstairs:27293} Has following equipment at home: {Assistive devices:23999}  OCCUPATION: ***  PLOF: {PLOF:24004}  PATIENT GOALS: ***  NEXT MD VISIT: ***  OBJECTIVE:   DIAGNOSTIC FINDINGS:  ***  PATIENT SURVEYS:  {rehab surveys:24030}  SCREENING FOR RED FLAGS: Bowel  or bladder incontinence: {Yes/No:304960894} Spinal tumors: {Yes/No:304960894} Cauda equina syndrome: {Yes/No:304960894} Compression fracture: {Yes/No:304960894} Abdominal aneurysm: {Yes/No:304960894}  COGNITION: Overall cognitive status: {cognition:24006}     SENSATION: {sensation:27233}  MUSCLE LENGTH: Hamstrings: Right *** deg; Left *** deg Thomas test: Right *** deg; Left *** deg  POSTURE: {posture:25561}  PALPATION: ***  LUMBAR ROM:   AROM eval  Flexion   Extension   Right lateral flexion   Left lateral flexion   Right rotation   Left rotation    (Blank rows = not tested)  LOWER EXTREMITY ROM:     {AROM/PROM:27142}  Right eval Left eval  Hip flexion    Hip extension    Hip abduction    Hip adduction    Hip internal rotation    Hip  external rotation    Knee flexion    Knee extension    Ankle dorsiflexion    Ankle plantarflexion    Ankle inversion    Ankle eversion     (Blank rows = not tested)  LOWER EXTREMITY MMT:    MMT Right eval Left eval  Hip flexion    Hip extension    Hip abduction    Hip adduction    Hip internal rotation    Hip external rotation    Knee flexion    Knee extension    Ankle dorsiflexion    Ankle plantarflexion    Ankle inversion    Ankle eversion     (Blank rows = not tested)  LUMBAR SPECIAL TESTS:  {lumbar special test:25242}  FUNCTIONAL TESTS:  {Functional tests:24029}  GAIT: Distance walked: *** Assistive device utilized: {Assistive devices:23999} Level of assistance: {Levels of assistance:24026} Comments: ***  TODAY'S TREATMENT:                                                                                                                              DATE: ***    PATIENT EDUCATION:  Education details: *** Person educated: {Person educated:25204} Education method: {Education Method:25205} Education comprehension: {Education Comprehension:25206}  HOME EXERCISE  PROGRAM: ***  ASSESSMENT:  CLINICAL IMPRESSION: Patient is a *** y.o. *** who was seen today for physical therapy evaluation and treatment for ***.   OBJECTIVE IMPAIRMENTS: {opptimpairments:25111}.   ACTIVITY LIMITATIONS: {activitylimitations:27494}  PARTICIPATION LIMITATIONS: {participationrestrictions:25113}  PERSONAL FACTORS: {Personal factors:25162} are also affecting patient's functional outcome.   REHAB POTENTIAL: {rehabpotential:25112}  CLINICAL DECISION MAKING: {clinical decision making:25114}  EVALUATION COMPLEXITY: {Evaluation complexity:25115}   GOALS: Goals reviewed with patient? {yes/no:20286}  SHORT TERM GOALS: Target date: ***  *** Baseline: Goal status: {GOALSTATUS:25110}  2.  *** Baseline:  Goal status: {GOALSTATUS:25110}  3.  *** Baseline:  Goal status: {GOALSTATUS:25110}  4.  *** Baseline:  Goal status: {GOALSTATUS:25110}  5.  *** Baseline:  Goal status: {GOALSTATUS:25110}  6.  *** Baseline:  Goal status: {GOALSTATUS:25110}  LONG TERM GOALS: Target date: ***  *** Baseline:  Goal status: {GOALSTATUS:25110}  2.  *** Baseline:  Goal status: {GOALSTATUS:25110}  3.  *** Baseline:  Goal status: {GOALSTATUS:25110}  4.  *** Baseline:  Goal status: {GOALSTATUS:25110}  5.  *** Baseline:  Goal status: {GOALSTATUS:25110}  6.  *** Baseline:  Goal status: {GOALSTATUS:25110}  PLAN:  PT FREQUENCY: {rehab frequency:25116}  PT DURATION: {rehab duration:25117}  PLANNED INTERVENTIONS: {rehab planned interventions:25118::"Therapeutic exercises","Therapeutic activity","Neuromuscular re-education","Balance training","Gait training","Patient/Family education","Self Care","Joint mobilization"}.  PLAN FOR NEXT SESSION: Lynden Ang, PT 02/20/2023, 11:59 AM

## 2023-02-23 ENCOUNTER — Ambulatory Visit: Payer: BC Managed Care – PPO | Attending: Family Medicine | Admitting: Physical Therapy

## 2023-02-23 ENCOUNTER — Telehealth: Payer: Self-pay | Admitting: Physical Therapy

## 2023-02-23 NOTE — Telephone Encounter (Signed)
Spoke with pt. She was unaware she had 3 evals scheduled. She confirmed the next two eval's on 3/26 and 4/10.

## 2023-02-27 ENCOUNTER — Other Ambulatory Visit: Payer: Self-pay | Admitting: Family Medicine

## 2023-02-27 DIAGNOSIS — N898 Other specified noninflammatory disorders of vagina: Secondary | ICD-10-CM

## 2023-03-24 ENCOUNTER — Ambulatory Visit: Payer: BC Managed Care – PPO | Attending: Family Medicine | Admitting: Physical Therapy

## 2023-03-24 ENCOUNTER — Encounter: Payer: Self-pay | Admitting: Physical Therapy

## 2023-03-24 ENCOUNTER — Other Ambulatory Visit: Payer: Self-pay

## 2023-03-24 DIAGNOSIS — M5432 Sciatica, left side: Secondary | ICD-10-CM | POA: Diagnosis not present

## 2023-03-24 DIAGNOSIS — K5909 Other constipation: Secondary | ICD-10-CM | POA: Insufficient documentation

## 2023-03-24 DIAGNOSIS — M5442 Lumbago with sciatica, left side: Secondary | ICD-10-CM

## 2023-03-24 NOTE — Therapy (Signed)
OUTPATIENT PHYSICAL THERAPY THORACOLUMBAR EVALUATION   Patient Name: Lori Mays MRN: [id number] DOB:Apr 27, 1986, 37 y.o., female Today's Date: 03/24/2023  END OF SESSION:  PT End of Session - 03/24/23 2015     Visit Number 1    Number of Visits 1    Authorization Type BCBS and Medicaid    Authorization Time Period one time visit    PT Start Time 1622   pt arrived late   PT Stop Time 1656    PT Time Calculation (min) 34 min    Activity Tolerance Patient tolerated treatment well    Behavior During Therapy [facility] for tasks assessed/performed             Past Medical History:  Diagnosis Date   Abnormal pap smears 06/16/2013   Asthma    DVT, lower extremity, proximal ([place])    Endometriosis    Past Surgical History:  Procedure Laterality Date   laproscopy  2011   LEEP N/A 12/2019   Patient Active Problem List   Diagnosis Date Noted   Chronic constipation 05/09/2020   History of DVT (deep vein thrombosis) 05/02/2020   Dysplasia of cervix, 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   Low grade squamous intraepith lesion on cytologic smear cervix (lgsil) 11/09/2019   Asthma, chronic 06/22/2013   Migraine, unspecified 06/22/2013   Arthralgia 06/13/2013   Endometriosis 06/13/2013   Edema of both legs 06/13/2013   Weight gain 06/13/2013    PCP: Geryl Rankins, NP  REFERRING PROVIDER: Darron Doom, MD  REFERRING DIAG: Lt sciatic nerve  Rationale for Evaluation and Treatment: Rehabilitation  THERAPY DIAG:  Low back pain with left-sided sciatica, unspecified back pain laterality, unspecified chronicity  ONSET DATE: approximately 3-4 years on and off   SUBJECTIVE:                                                                                                                                                                                           SUBJECTIVE STATEMENT: Pt states that she has been noticing 3-4 years of on and off back and leg pain. She does have tingling in the LE  when she is sitting, but this is more difficult to pinpoint. She also has chronic constipation which causes low back pain.  PERTINENT HISTORY:  Chronic constipation, endimetriosis  PAIN:  Are you having pain? No pt took aleve prior to coming.  Typically pain Lt side low back lateral hip/thigh into the shin towards the foot.   PRECAUTIONS: None  WEIGHT BEARING RESTRICTIONS: No  FALLS:  Has patient fallen in last 6 months? No  LIVING ENVIRONMENT: Lives with:  Lives in: House/apartment Stairs:  No Has following equipment at home:   OCCUPATION: prolonged sitting, 8 or 9 hour days   PLOF: Independent  PATIENT GOALS: decrease pain in back and LE  NEXT MD VISIT: unsure  OBJECTIVE:   DIAGNOSTIC FINDINGS:    PATIENT SURVEYS:    SCREENING FOR RED FLAGS: Bowel or bladder incontinence: No Spinal tumors: No Cauda equina syndrome: No Compression fracture: No Abdominal aneurysm: No  COGNITION: Overall cognitive status: Within functional limits for tasks assessed     SENSATION: Pt denies numbness/tingling at this moment  MUSCLE LENGTH: Hamstrings: Right WNL deg; Left WNL deg Marcello Moores test: Right WNL deg; Left WNL deg  POSTURE: No Significant postural limitations  PALPATION: Tenderness Lt QL, Lt lumbar paraspinals, Lt glute med  LUMBAR ROM:   AROM eval  Flexion WNL, slight lean Lt (+) pain Lt hip  Extension WNL, pain in Lt hip resolved  Right lateral flexion   Left lateral flexion   Right rotation   Left rotation    (Blank rows = not tested)  LOWER EXTREMITY ROM:       Right eval Left eval  Hip flexion    Hip extension    Hip abduction    Hip adduction    Hip internal rotation    Hip external rotation    Knee flexion    Knee extension    Ankle dorsiflexion    Ankle plantarflexion    Ankle inversion    Ankle eversion     (Blank rows = not tested)  LOWER EXTREMITY MMT:    MMT Right eval Left eval  Hip flexion  5  Hip extension 4 4  Hip  abduction 4 3+  Hip adduction    Hip internal rotation    Hip external rotation    Knee flexion    Knee extension 5 5  Ankle dorsiflexion  5  Ankle plantarflexion    Ankle inversion    Ankle eversion     (Blank rows = not tested)  LUMBAR SPECIAL TESTS:  Slump test: Positive Lt  FUNCTIONAL TESTS:    GAIT:   TODAY'S TREATMENT:                                                                                                                              DATE:   Seated posture and use of lumbar roll throughout the day Supine sciatic nerve flossing x10 reps HEP demo      PATIENT EDUCATION:  Education details: eval findings/POC; posture throughout the day; implemented HEP Person educated: Patient Education method: Explanation and Handouts Education comprehension: verbalized understanding and returned demonstration  HOME EXERCISE PROGRAM: Access Code: [email or web address] URL: [email or web address] Date: 03/24/2023 Prepared by: [facility]  Exercises - Supine 90/90 Sciatic Nerve Glide with Knee Flexion/Extension  - 3 x daily - 7 x weekly - 1 sets - 10 reps  ASSESSMENT:  CLINICAL IMPRESSION: Patient is a 37 y.o. F  who was seen today for physical therapy evaluation and treatment for low back pain with sciatica of Lt side. Pt's symptoms have been going on for 3 or 4 years. Pain is worse with sitting at work or sitting on the toilet for long periods of time. No tingling at the time of eval, but pt notes tingling intermittently throughout the day. Pt has gluteal weakness and trunk weakness evident with bed mobility/transfers and MMT. Pt had some increase in Lt buttock pain with repeated lumbar flexion, and a positive slump test on the Lt. Pt would benefit from skilled PT to address hip and trunk limitations in strength and neuromuscular control. PT provided education on improving seated posture while working. She will have a pelvic PT  evaluation in the next 2 weeks to further assess her chronic constipation and accompanying symptoms.    OBJECTIVE IMPAIRMENTS: decreased activity tolerance, decreased mobility, decreased ROM, decreased strength, impaired flexibility, improper body mechanics, postural dysfunction, and pain.   ACTIVITY LIMITATIONS: sitting, stairs, and toileting  PARTICIPATION LIMITATIONS: community activity and occupation  PERSONAL FACTORS: Age, Past/current experiences, and 1 comorbidity: chronic constipation  are also affecting patient's functional outcome.   REHAB POTENTIAL: Good  CLINICAL DECISION MAKING: Stable/uncomplicated  EVALUATION COMPLEXITY: Low   GOALS: Goals reviewed with patient? Yes  SHORT TERM GOALS: Target date: 03/24/23  Pt will have good understanding of use of lumbar roll to decrease pain with sitting.  Baseline: Goal status: MET  PLAN:  PT FREQUENCY: one time visit  PT DURATION: 1 week  PLANNED INTERVENTIONS: Therapeutic exercises, Therapeutic activity, Neuromuscular re-education, Patient/Family education, Self Care, Joint mobilization, and Manual therapy.  PLAN FOR NEXT SESSION: one time visit, pt seeing pelvic PT for chronic constipation and core   8:33 PM,03/24/23 Sherol Dade PT, DPT [place] [facility] [place]

## 2023-04-07 NOTE — Therapy (Unsigned)
OUTPATIENT PHYSICAL THERAPY FEMALE PELVIC EVALUATION   Patient Name: Lori Mays MRN: [id number] DOB:Dec 07, 1986, 37 y.o., female Today's Date: 04/07/2023  END OF SESSION:   Past Medical History:  Diagnosis Date   Abnormal pap smears 06/16/2013   Asthma    DVT, lower extremity, proximal (HCC)    Endometriosis    Past Surgical History:  Procedure Laterality Date   laproscopy  2011   LEEP N/A 12/2019   Patient Active Problem List   Diagnosis Date Noted   Chronic constipation 05/09/2020   History of DVT (deep vein thrombosis) 05/02/2020   Dysplasia of cervix, 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   Low grade squamous intraepith lesion on cytologic smear cervix (lgsil) 11/09/2019   Asthma, chronic 06/22/2013   Migraine, unspecified 06/22/2013   Arthralgia 06/13/2013   Endometriosis 06/13/2013   Edema of both legs 06/13/2013   Weight gain 06/13/2013    PCP: Claiborne Rigg, NP  REFERRING PROVIDER: Reva Bores, MD   REFERRING DIAG:  K59.09 (ICD-10-CM) - Chronic constipation  M54.32 (ICD-10-CM) - Left sciatic nerve pain    THERAPY DIAG:  No diagnosis found.  Rationale for Evaluation and Treatment: Rehabilitation  ONSET DATE: ***  SUBJECTIVE:                                                                                                                                                                                           SUBJECTIVE STATEMENT: *** Fluid intake: {Yes/No:304960894}   PAIN:  Are you having pain? {yes/no:20286} NPRS scale: ***/10 Pain location: {pelvic pain location:27098}  Pain type: {type:313116} Pain description: {PAIN DESCRIPTION:21022940}   Aggravating factors: *** Relieving factors: ***  PRECAUTIONS: None  WEIGHT BEARING RESTRICTIONS: No  FALLS:  Has patient fallen in last 6 months? {fallsyesno:27318}  LIVING ENVIRONMENT: Lives with: {[facility] lives with:25569::"lives with their family"} Lives in: {Lives in:25570} Stairs:  {opstairs:27293} Has following equipment at home: {Assistive devices:23999}  OCCUPATION: ***  PLOF: {PLOF:24004}  PATIENT GOALS: ***  PERTINENT HISTORY:  Chronic constipation, endimetriosis  Sexual abuse: {Yes/No:304960894}  BOWEL MOVEMENT: Pain with bowel movement: {yes/no:20286} Type of bowel movement:{PT BM type:27100} Fully empty rectum: {Yes/No:304960894} Leakage: {Yes/No:304960894} Pads: {Yes/No:304960894} Fiber supplement: {Yes/No:304960894}  URINATION: Pain with urination: {yes/no:20286} Fully empty bladder: {Yes/No:304960894} Stream: {PT urination:27102} Urgency: {Yes/No:304960894} Frequency: *** Leakage: {PT leakage:27103} Pads: {Yes/No:304960894}  INTERCOURSE: Pain with intercourse: {pain with intercourse PA:27099} Ability to have vaginal penetration:  {Yes/No:304960894} Climax: *** Marinoff Scale: ***/3  PREGNANCY: Vaginal deliveries *** Tearing {Yes***/No:304960894} C-section deliveries *** Currently pregnant {Yes***/No:304960894}  PROLAPSE: {PT prolapse:27101}   OBJECTIVE:   DIAGNOSTIC FINDINGS:  ***  PATIENT SURVEYS:  {rehab surveys:24030}  PFIQ-7 ***  COGNITION: Overall cognitive status: {cognition:24006}     SENSATION: Light touch: {intact/deficits:24005} Proprioception: {intact/deficits:24005}  MUSCLE LENGTH: Hamstrings: Right *** deg; Left *** deg Thomas test: Right *** deg; Left *** deg  LUMBAR SPECIAL TESTS:  {lumbar special test:25242}  FUNCTIONAL TESTS:  {Functional tests:24029}  GAIT: Distance walked: *** Assistive device utilized: {Assistive devices:23999} Level of assistance: {Levels of assistance:24026} Comments: ***  POSTURE: {posture:25561}  PELVIC ALIGNMENT:  LUMBARAROM/PROM:  A/PROM A/PROM  eval  Flexion   Extension   Right lateral flexion   Left lateral flexion   Right rotation   Left rotation    (Blank rows = not tested)  LOWER EXTREMITY ROM:  {AROM/PROM:27142} ROM Right eval Left eval   Hip flexion    Hip extension    Hip abduction    Hip adduction    Hip internal rotation    Hip external rotation    Knee flexion    Knee extension    Ankle dorsiflexion    Ankle plantarflexion    Ankle inversion    Ankle eversion     (Blank rows = not tested)  LOWER EXTREMITY MMT:  MMT Right eval Left eval  Hip flexion    Hip extension    Hip abduction    Hip adduction    Hip internal rotation    Hip external rotation    Knee flexion    Knee extension    Ankle dorsiflexion    Ankle plantarflexion    Ankle inversion    Ankle eversion     PALPATION:   General  ***                External Perineal Exam ***                             Internal Pelvic Floor ***  Patient confirms identification and approves PT to assess internal pelvic floor and treatment {yes/no:20286}  PELVIC MMT:   MMT eval  Vaginal   Internal Anal Sphincter   External Anal Sphincter   Puborectalis   Diastasis Recti   (Blank rows = not tested)        TONE: ***  PROLAPSE: ***  TODAY'S TREATMENT:                                                                                                                              DATE: ***  EVAL ***   PATIENT EDUCATION:  Education details: *** Person educated: {Person educated:25204} Education method: {Education Method:25205} Education comprehension: {Education Comprehension:25206}  HOME EXERCISE PROGRAM: ***  ASSESSMENT:  CLINICAL IMPRESSION: Patient is a *** y.o. *** who was seen today for physical therapy evaluation and treatment for ***.   OBJECTIVE IMPAIRMENTS: {opptimpairments:25111}.   ACTIVITY LIMITATIONS: {activitylimitations:27494}  PARTICIPATION LIMITATIONS: {participationrestrictions:25113}  PERSONAL FACTORS: {Personal factors:25162} are also affecting patient's functional outcome.   REHAB POTENTIAL: {rehabpotential:25112}  CLINICAL DECISION MAKING: {clinical decision making:25114}  EVALUATION COMPLEXITY:  {Evaluation complexity:25115}   GOALS: Goals reviewed with patient? {yes/no:20286}  SHORT TERM GOALS: Target date: ***  *** Baseline: Goal status: {GOALSTATUS:25110}  2.  *** Baseline:  Goal status: {GOALSTATUS:25110}  3.  *** Baseline:  Goal status: {GOALSTATUS:25110}  4.  *** Baseline:  Goal status: {GOALSTATUS:25110}  5.  *** Baseline:  Goal status: {GOALSTATUS:25110}  6.  *** Baseline:  Goal status: {GOALSTATUS:25110}  LONG TERM GOALS: Target date: ***  *** Baseline:  Goal status: {GOALSTATUS:25110}  2.  *** Baseline:  Goal status: {GOALSTATUS:25110}  3.  *** Baseline:  Goal status: {GOALSTATUS:25110}  4.  *** Baseline:  Goal status: {GOALSTATUS:25110}  5.  *** Baseline:  Goal status: {GOALSTATUS:25110}  6.  *** Baseline:  Goal status: {GOALSTATUS:25110}  PLAN:  PT FREQUENCY: {rehab frequency:25116}  PT DURATION: {rehab duration:25117}  PLANNED INTERVENTIONS: {rehab planned interventions:25118::"Therapeutic exercises","Therapeutic activity","Neuromuscular re-education","Balance training","Gait training","Patient/Family education","Self Care","Joint mobilization"}  PLAN FOR NEXT SESSION: ***   Coltan Spinello, PT 04/07/2023, 1:56 PM

## 2023-04-08 ENCOUNTER — Encounter: Payer: Self-pay | Admitting: Physical Therapy

## 2023-04-08 ENCOUNTER — Other Ambulatory Visit: Payer: Self-pay

## 2023-04-08 ENCOUNTER — Ambulatory Visit: Payer: BC Managed Care – PPO | Attending: Family Medicine | Admitting: Physical Therapy

## 2023-04-08 DIAGNOSIS — M6281 Muscle weakness (generalized): Secondary | ICD-10-CM | POA: Diagnosis not present

## 2023-04-08 DIAGNOSIS — R102 Pelvic and perineal pain: Secondary | ICD-10-CM | POA: Insufficient documentation

## 2023-04-08 DIAGNOSIS — M5459 Other low back pain: Secondary | ICD-10-CM | POA: Diagnosis not present

## 2023-04-29 ENCOUNTER — Encounter: Payer: Self-pay | Admitting: Physical Therapy

## 2023-04-29 ENCOUNTER — Ambulatory Visit: Payer: BC Managed Care – PPO

## 2023-05-07 ENCOUNTER — Other Ambulatory Visit: Payer: Self-pay

## 2023-05-07 ENCOUNTER — Other Ambulatory Visit (HOSPITAL_COMMUNITY)
Admission: RE | Admit: 2023-05-07 | Discharge: 2023-05-07 | Disposition: A | Discharge status: Home / Self Care | Payer: BC Managed Care – PPO | Source: Ambulatory Visit | Attending: Obstetrics and Gynecology | Admitting: Obstetrics and Gynecology

## 2023-05-07 ENCOUNTER — Ambulatory Visit: Payer: BC Managed Care – PPO | Admitting: General Practice

## 2023-05-07 ENCOUNTER — Encounter: Payer: Self-pay | Admitting: General Practice

## 2023-05-07 VITALS — BP 119/82 | HR 69 | Ht 69.0 in | Wt 161.0 lb

## 2023-05-07 DIAGNOSIS — N898 Other specified noninflammatory disorders of vagina: Secondary | ICD-10-CM | POA: Insufficient documentation

## 2023-05-07 NOTE — Progress Notes (Signed)
Patient presents to office today requesting self swab due to change in vaginal discharge- reports it is a white, creamy discharge. Denies odor or itching/irritation. Reports history of BV. Patient also reports being late for her period and would like UPT. UPT -. Patient instructed in self swab and specimen collected. Advised results will be back in 24-48 hours and available via mychart.   Chase Caller RN BSN 05/07/23

## 2023-05-08 LAB — CERVICOVAGINAL ANCILLARY ONLY
Bacterial Vaginitis (gardnerella): POSITIVE — AB
Candida Glabrata: NEGATIVE
Candida Vaginitis: NEGATIVE
Comment: NEGATIVE
Comment: NEGATIVE
Comment: NEGATIVE
Comment: NEGATIVE
Trichomonas: NEGATIVE

## 2023-05-11 ENCOUNTER — Other Ambulatory Visit: Payer: Self-pay | Admitting: Family Medicine

## 2023-05-11 MED ORDER — METRONIDAZOLE 500 MG PO TABS: 500.0000 mg | ORAL_TABLET | Freq: Two times a day (BID) | ORAL | 0 refills | Status: AC

## 2023-05-14 ENCOUNTER — Telehealth: Payer: Self-pay | Admitting: Physician Assistant

## 2023-05-14 NOTE — Telephone Encounter (Signed)
Records indicate the insurance did not approve Linzess and the patient has been on lubiprostone.  Called the patient to discuss her concerns. No answer. Left a message on her voicemail of my call.

## 2023-05-14 NOTE — Telephone Encounter (Signed)
Received MyChart message to schedule an OV with Amy. Appointment scheduled for 07/30/23 at 2:00 p.m. Patient states she believes she needs a colonoscopy.  She has had blood in her stool, which was a very small stool.  She hasn't been able to fill her linzess in months and feels she may need an alternative prescription.  Please call patient and advise.  Thank you.

## 2023-05-22 ENCOUNTER — Ambulatory Visit: Payer: BC Managed Care – PPO | Attending: Family Medicine | Admitting: Physical Therapy

## 2023-05-22 ENCOUNTER — Encounter: Payer: Self-pay | Admitting: Physical Therapy

## 2023-05-22 DIAGNOSIS — M6281 Muscle weakness (generalized): Secondary | ICD-10-CM | POA: Insufficient documentation

## 2023-05-22 DIAGNOSIS — R102 Pelvic and perineal pain: Secondary | ICD-10-CM | POA: Insufficient documentation

## 2023-05-22 NOTE — Therapy (Signed)
OUTPATIENT PHYSICAL THERAPY FEMALE PELVIC TREATMENT  Patient Name: Lori Mays MRN: [id number] DOB:1986-09-20, 37 y.o., female Today's Date: 05/22/2023  END OF SESSION:  PT End of Session - 05/22/23 0810     Visit Number 2    Date for PT Re-Evaluation 07/01/23    Authorization Type [employer] and Medicaid    Authorization Time Period 05/22/2023-06/11/2023    Authorization - Visit Number 1    Authorization - Number of Visits 3    PT Start Time 0810   came late   PT Stop Time 0840    PT Time Calculation (min) 30 min    Activity Tolerance Patient tolerated treatment well    Behavior During Therapy [facility] for tasks assessed/performed             Past Medical History:  Diagnosis Date   Abnormal pap smears 06/16/2013   Asthma    DVT, lower extremity, proximal (HCC)    Endometriosis    Past Surgical History:  Procedure Laterality Date   laproscopy  2011   LEEP N/A 12/2019   Patient Active Problem List   Diagnosis Date Noted   Chronic constipation 05/09/2020   History of DVT (deep vein thrombosis) 05/02/2020   Dysplasia of cervix, 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   Low grade squamous intraepith lesion on cytologic smear cervix (lgsil) 11/09/2019   Asthma, chronic 06/22/2013   Migraine, unspecified 06/22/2013   Arthralgia 06/13/2013   Endometriosis 06/13/2013   Edema of both legs 06/13/2013   Weight gain 06/13/2013    PCP: Claiborne Rigg, NP  REFERRING PROVIDER: Reva Bores, MD   REFERRING DIAG:  K59.09 (ICD-10-CM) - Chronic constipation  M54.32 (ICD-10-CM) - Left sciatic nerve pain    THERAPY DIAG:  Muscle weakness (generalized)  Pelvic pain  Rationale for Evaluation and Treatment: Rehabilitation  ONSET DATE: 8/23  SUBJECTIVE:                                                                                                                                                                                           SUBJECTIVE STATEMENT: I have an appointment  with GI doctor in August. I went to chiropractor and helped the back pain. I get a lot of sciatica pain on the left  PAIN:  Are you having pain? Yes NPRS scale: 1/10 Pain location:  left lumbar to the left calf  Pain type: throbbing and sore muscles Pain description: intermittent   Aggravating factors: sitting on commode, sitting at work on a cushion Relieving factors: not sitting on left buttocks, reduce pressure on the left leg  PRECAUTIONS: None  WEIGHT BEARING RESTRICTIONS: No  FALLS:  Has patient fallen in last 6 months? No  LIVING ENVIRONMENT: Lives with: lives alone  OCCUPATION: sitting job  PLOF: Independent  PATIENT GOALS: improve pain management, improve flexibility  PERTINENT HISTORY:  Chronic constipation, endometriosis    BOWEL MOVEMENT: Pain with bowel movement: Yes, 2-8/10 entire of the movement in the abdomen and using the bathroom actively Type of bowel movement:Type (Bristol Stool Scale) type 1-7, Frequency 1-2 time per week, Strain Yes, and Splinting no , some days has severe diarrhea  Fully empty rectum: No Leakage: No Fiber supplement: No  URINATION: Pain with urination: No Fully empty bladder: Yes: can take awhile, may sit for awhile and more urine will come out Stream:  average Urgency: No Frequency: average Leakage:  none  INTERCOURSE: not active right now    OBJECTIVE:   DIAGNOSTIC FINDINGS:  none  PATIENT SURVEYS:  CRAIQ-7 81  COGNITION: Overall cognitive status: Within functional limits for tasks assessed     SENSATION: Light touch: Appears intact Proprioception: Appears intact   LUMBAR SPECIAL TESTS:  Straight leg raise test: Negative, Slump test: Positive, SI Compression/distraction test: Positive, and Trendelenburg sign: Negative Posterior glide of left femur in supine has pain relief  POSTURE: No Significant postural limitations  PELVIC ALIGNMENT:  LUMBARAROM/PROM:  A/PROM A/PROM  eval  Flexion full   Extension Decreased by 25% with pain on left  Right lateral flexion Full with pain in the left lumbar  Left lateral flexion Decreased by 25%  Right rotation Decreased by 25%  Left rotation full   (Blank rows = not tested)  LOWER EXTREMITY ROM: Bilateral hip ROM is full   LOWER EXTREMITY MMT:  MMT Right eval Left eval  Hip extension 4/5 3/5  Hip abduction 4/5 3/5  Hip adduction  3/5   PALPATION:   General  Patient has difficulty with full expansion of the lower rib cage, tenderness located throughout the abdomen. Tenderness located in the lumbar,  gluteal,  piriformis,  levator ani                External Perineal Exam tenderness located in the puborectalis, and levator ani                             Internal Pelvic Floor tender in bilateral levator ani and obturator internist. Patient is not able to push the therapist finger out of the anal canal. She does not tighten the rectum just not able to generate the force to push it out and the pelvic floor muscles do not lengthen.   Patient confirms identification and approves PT to assess internal pelvic floor and treatment Yes  PELVIC MMT:   MMT eval  Internal Anal Sphincter 2/5  External Anal Sphincter 2/5  Puborectalis 2/5  (Blank rows = not tested)        TONE: Increased    TODAY'S TREATMENT:   05/22/23 Manual: Myofascial release: Using the suction cup to the abdomen to release the fascial restrictions Release of the mesenteric root Release of the suprapubic area Release of the right lower quadrant  DATE: 04/08/23  EVAL therapist finished the evaluation   PATIENT EDUCATION:  Education details: educated patient on the findings in the evaluation and reviewed her goals Person educated: Patient Education method: Explanation Education comprehension: verbalized understanding  HOME EXERCISE  PROGRAM: See above  ASSESSMENT:  CLINICAL IMPRESSION: Patient is a 37  y.o. female who was seen today for physical therapy  treatment for sciatica and constipation.  Patient had restrictions of the abdomen. She has not been able to have a productive bowel movement in the past week. Good bowel sounds during the manual work. She saw the chiropractor and had some relief of her back. Patient will benefit from skilled therapy to improve pelvic floor lengthen to reduce trigger points and improve constipation while reducing left leg pain.   OBJECTIVE IMPAIRMENTS: decreased coordination, decreased endurance, decreased ROM, decreased strength, increased fascial restrictions, increased muscle spasms, and pain.   ACTIVITY LIMITATIONS: sitting and toileting  PARTICIPATION LIMITATIONS: occupation  PERSONAL FACTORS: Time since onset of injury/illness/exacerbation and 1-2 comorbidities: Chronic constipation, endimetriosis  are also affecting patient's functional outcome.   REHAB POTENTIAL: Good  CLINICAL DECISION MAKING: Evolving/moderate complexity  EVALUATION COMPLEXITY: Moderate   GOALS: Goals reviewed with patient? Yes  SHORT TERM GOALS: Target date: 05/04/23  Patient independent with initial HEP for pelvic floor lengthening and stretching program.  Baseline:Not educated yet Goal status: INITIAL   LONG TERM GOALS: Target date: 07/01/23  Patient independent with advanced HEP for pelvic floor, back and leg exercises.  Baseline: Not educated Goal status: INITIAL  2.  Patient able to sit for 45 minutes with pain level not increasing >/= 3/10 due to reduction of trigger points.  Baseline: pain level 7/10 Goal status: INITIAL  3.  Patient is able to push the therapist finger out of the rectum 3 out 5 times due to the ability to lengthen the pelvic floor muscles and generate the pressure to expel the therapist finger.  Baseline: not able to expel the therapist finger.  Goal status:  INITIAL  4.  CRAIQ-7 score is </= 15 due to reduction of pain and patient frustration has decreased.  Baseline: CRAIQ-7 67 Goal status: INITIAL  5.  Patient has increased in lumbar ROM due to improve muscle imbalances and improved mobility of the lumbar facets to expel the stool.  Baseline: lumbar ROM decreased by 25% Goal status: INITIAL   PLAN:  PT FREQUENCY: 1x/week  PT DURATION: 12 weeks  PLANNED INTERVENTIONS: Therapeutic exercises, Therapeutic activity, Neuromuscular re-education, Balance training, Patient/Family education, Joint mobilization, Dry Needling, Spinal mobilization, Cryotherapy, Moist heat, Taping, Ultrasound, Biofeedback, and Manual therapy  PLAN FOR NEXT SESSION: manual therapy to lumbar, diaphragmatic breathing, manual therapy to pelvic floor, dry needling to pelvic floor and lumbar   Eulis Foster, PT 05/22/23 8:44 AM

## 2023-05-26 NOTE — Telephone Encounter (Signed)
Left message on machine to call back  

## 2023-06-01 ENCOUNTER — Ambulatory Visit: Payer: BC Managed Care – PPO | Admitting: Physical Therapy

## 2023-06-08 ENCOUNTER — Ambulatory Visit: Payer: BC Managed Care – PPO | Admitting: Physical Therapy

## 2023-06-15 ENCOUNTER — Encounter: Payer: BC Managed Care – PPO | Admitting: Physical Therapy

## 2023-06-22 ENCOUNTER — Encounter: Payer: Self-pay | Admitting: Physical Therapy

## 2023-06-22 ENCOUNTER — Ambulatory Visit: Payer: BC Managed Care – PPO | Attending: Family Medicine | Admitting: Physical Therapy

## 2023-06-22 DIAGNOSIS — R102 Pelvic and perineal pain: Secondary | ICD-10-CM | POA: Diagnosis not present

## 2023-06-22 DIAGNOSIS — M6281 Muscle weakness (generalized): Secondary | ICD-10-CM | POA: Diagnosis not present

## 2023-06-22 NOTE — Therapy (Signed)
OUTPATIENT PHYSICAL THERAPY FEMALE PELVIC TREATMENT  Patient Name: Lori Mays MRN: [id number] DOB:November 04, 1986, 37 y.o., female Today's Date: 06/22/2023  END OF SESSION:  PT End of Session - 06/22/23 1540     Visit Number 3    Date for PT Re-Evaluation 07/01/23    Authorization Type BCBS and Medicaid    Authorization Time Period 6/24-9/15    Authorization - Visit Number 1    Authorization - Number of Visits 12    PT Start Time 1538   came late   PT Stop Time 1610    PT Time Calculation (min) 32 min    Activity Tolerance Patient tolerated treatment well    Behavior During Therapy [facility] for tasks assessed/performed             Past Medical History:  Diagnosis Date   Abnormal pap smears 06/16/2013   Asthma    DVT, lower extremity, proximal (HCC)    Endometriosis    Past Surgical History:  Procedure Laterality Date   laproscopy  2011   LEEP N/A 12/2019   Patient Active Problem List   Diagnosis Date Noted   Chronic constipation 05/09/2020   History of DVT (deep vein thrombosis) 05/02/2020   Dysplasia of cervix, 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   Low grade squamous intraepith lesion on cytologic smear cervix (lgsil) 11/09/2019   Asthma, chronic 06/22/2013   Migraine, unspecified 06/22/2013   Arthralgia 06/13/2013   Endometriosis 06/13/2013   Edema of both legs 06/13/2013   Weight gain 06/13/2013    PCP: Claiborne Rigg, NP  REFERRING PROVIDER: Reva Bores, MD   REFERRING DIAG:  K59.09 (ICD-10-CM) - Chronic constipation  M54.32 (ICD-10-CM) - Left sciatic nerve pain    THERAPY DIAG:  Muscle weakness (generalized)  Pelvic pain  Rationale for Evaluation and Treatment: Rehabilitation  ONSET DATE: 8/23  SUBJECTIVE:                                                                                                                                                                                           SUBJECTIVE STATEMENT I have a burning pain in the right  lower abdominal area and swelling in feet. My cycle has been irregular. I have had eggplant discharge due to the endometrial fluid come out.   PAIN:  Are you having pain? Yes NPRS scale: 7/10 Pain location:  left lumbar to the left calf  Pain type: throbbing and sore muscles Pain description: intermittent   Aggravating factors: sitting on commode, sitting at work on a cushion Relieving factors: not sitting on left buttocks, reduce pressure on the left leg  PAIN:  Are you having pain? Yes: NPRS scale: 4/10 Pain location: right lower abdominal  Pain description: sharp, shooting, throbbing Aggravating factors: sitting, back up in the colon Relieving factors: heat, pain medication   PRECAUTIONS: None  WEIGHT BEARING RESTRICTIONS: No  FALLS:  Has patient fallen in last 6 months? No  LIVING ENVIRONMENT: Lives with: lives alone  OCCUPATION: sitting job  PLOF: Independent  PATIENT GOALS: improve pain management, improve flexibility  PERTINENT HISTORY:  Chronic constipation, endometriosis    BOWEL MOVEMENT: Pain with bowel movement: Yes, 2-8/10 entire of the movement in the abdomen and using the bathroom actively Type of bowel movement:Type (Bristol Stool Scale) type 1-7, Frequency 1-2 time per week, Strain Yes, and Splinting no , some days has severe diarrhea  Fully empty rectum: No Leakage: No Fiber supplement: No  URINATION: Pain with urination: No Fully empty bladder: Yes: can take awhile, may sit for awhile and more urine will come out Stream:  average Urgency: No Frequency: average Leakage:  none  INTERCOURSE: not active right now    OBJECTIVE:   DIAGNOSTIC FINDINGS:  none  PATIENT SURVEYS:  CRAIQ-7 28  COGNITION: Overall cognitive status: Within functional limits for tasks assessed     SENSATION: Light touch: Appears intact Proprioception: Appears intact   LUMBAR SPECIAL TESTS:  Straight leg raise test: Negative, Slump test: Positive, SI  Compression/distraction test: Positive, and Trendelenburg sign: Negative Posterior glide of left femur in supine has pain relief  POSTURE: No Significant postural limitations  PELVIC ALIGNMENT: Correct alignment  LUMBARAROM/PROM:  A/PROM A/PROM  eval  Flexion full  Extension Decreased by 25% with pain on left  Right lateral flexion Full with pain in the left lumbar  Left lateral flexion Decreased by 25%  Right rotation Decreased by 25%  Left rotation full   (Blank rows = not tested)  LOWER EXTREMITY ROM: Bilateral hip ROM is full   LOWER EXTREMITY MMT:  MMT Right eval Left eval  Hip extension 4/5 3/5  Hip abduction 4/5 3/5  Hip adduction  3/5   PALPATION:   General  Patient has difficulty with full expansion of the lower rib cage, tenderness located throughout the abdomen. Tenderness located in the lumbar,  gluteal,  piriformis,  levator ani                External Perineal Exam tenderness located in the puborectalis, and levator ani                             Internal Pelvic Floor tender in bilateral levator ani and obturator internist. Patient is not able to push the therapist finger out of the anal canal. She does not tighten the rectum just not able to generate the force to push it out and the pelvic floor muscles do not lengthen.   Patient confirms identification and approves PT to assess internal pelvic floor and treatment Yes  PELVIC MMT:   MMT eval  Internal Anal Sphincter 2/5  External Anal Sphincter 2/5  Puborectalis 2/5  (Blank rows = not tested)        TONE: Increased    TODAY'S TREATMENT:   06/22/23 Manual: Soft tissue mobilization: To assess for dry needling Manual work to the perineal body puborectalis, obturator internist, and superior transverse while patient performs diaphragmatic breathing  Trigger Point Dry-Needling  Treatment instructions: Expect mild to moderate muscle soreness. S/S of pneumothorax if dry needled over a lung field,  and to  seek immediate medical attention should they occur. Patient verbalized understanding of these instructions and education.  Patient Consent Given: Yes Education handout provided: Previously provided Muscles treated: perineal body Electrical stimulation performed: No Parameters: N/A Treatment response/outcome: elongation of muscle and trigger point response    05/22/23 Manual: Myofascial release: Using the suction cup to the abdomen to release the fascial restrictions Release of the mesenteric root Release of the suprapubic area Release of the right lower quadrant                                                                                                                               PATIENT EDUCATION:  Education details: educated patient on the findings in the evaluation and reviewed her goals Person educated: Patient Education method: Explanation Education comprehension: verbalized understanding  HOME EXERCISE PROGRAM: See above  ASSESSMENT:  CLINICAL IMPRESSION: Patient is a 37  y.o. female who was seen today for physical therapy  treatment for sciatica and constipation.  Patient had tightness in the pelvic floor and after the manual work will relax the pelvic floor. She was having a lot of pain today. Patient still needs to learn about lengthening her pelvic floor. Patient did not like the dry needling therefore will not do again in the future.   Patient will benefit from skilled therapy to improve pelvic floor lengthen to reduce trigger points and improve constipation while reducing left leg pain.   OBJECTIVE IMPAIRMENTS: decreased coordination, decreased endurance, decreased ROM, decreased strength, increased fascial restrictions, increased muscle spasms, and pain.   ACTIVITY LIMITATIONS: sitting and toileting  PARTICIPATION LIMITATIONS: occupation  PERSONAL FACTORS: Time since onset of injury/illness/exacerbation and 1-2 comorbidities: Chronic constipation,  endimetriosis  are also affecting patient's functional outcome.   REHAB POTENTIAL: Good  CLINICAL DECISION MAKING: Evolving/moderate complexity  EVALUATION COMPLEXITY: Moderate   GOALS: Goals reviewed with patient? Yes  SHORT TERM GOALS: Target date: 05/04/23  Patient independent with initial HEP for pelvic floor lengthening and stretching program.  Baseline:Not educated yet Goal status: INITIAL   LONG TERM GOALS: Target date: 07/01/23  Patient independent with advanced HEP for pelvic floor, back and leg exercises.  Baseline: Not educated Goal status: INITIAL  2.  Patient able to sit for 45 minutes with pain level not increasing >/= 3/10 due to reduction of trigger points.  Baseline: pain level 7/10 Goal status: INITIAL  3.  Patient is able to push the therapist finger out of the rectum 3 out 5 times due to the ability to lengthen the pelvic floor muscles and generate the pressure to expel the therapist finger.  Baseline: not able to expel the therapist finger.  Goal status: INITIAL  4.  CRAIQ-7 score is </= 15 due to reduction of pain and patient frustration has decreased.  Baseline: CRAIQ-7 67 Goal status: INITIAL  5.  Patient has increased in lumbar ROM due to improve muscle imbalances and improved mobility of the lumbar facets to expel  the stool.  Baseline: lumbar ROM decreased by 25% Goal status: INITIAL   PLAN:  PT FREQUENCY: 1x/week  PT DURATION: 12 weeks  PLANNED INTERVENTIONS: Therapeutic exercises, Therapeutic activity, Neuromuscular re-education, Balance training, Patient/Family education, Joint mobilization, Dry Needling, Spinal mobilization, Cryotherapy, Moist heat, Taping, Ultrasound, Biofeedback, and Manual therapy  PLAN FOR NEXT SESSION: manual therapy to lumbar, diaphragmatic breathing, manual therapy to pelvic floor, dry needling to pelvic floor and lumbar   Eulis Foster, PT 06/22/23 5:03 PM

## 2023-06-22 NOTE — Patient Instructions (Signed)

## 2023-06-29 ENCOUNTER — Encounter: Payer: Self-pay | Admitting: Physical Therapy

## 2023-06-29 ENCOUNTER — Ambulatory Visit: Payer: BC Managed Care – PPO | Attending: Family Medicine | Admitting: Physical Therapy

## 2023-06-29 DIAGNOSIS — M6281 Muscle weakness (generalized): Secondary | ICD-10-CM | POA: Insufficient documentation

## 2023-06-29 DIAGNOSIS — R102 Pelvic and perineal pain: Secondary | ICD-10-CM | POA: Diagnosis not present

## 2023-06-29 DIAGNOSIS — M5459 Other low back pain: Secondary | ICD-10-CM | POA: Insufficient documentation

## 2023-06-29 DIAGNOSIS — M5442 Lumbago with sciatica, left side: Secondary | ICD-10-CM | POA: Diagnosis not present

## 2023-06-29 NOTE — Therapy (Signed)
OUTPATIENT PHYSICAL THERAPY FEMALE PELVIC TREATMENT  Patient Name: Lori Mays MRN: [id number] DOB:08-Nov-1986, 37 y.o., female Today's Date: 06/29/2023  END OF SESSION:  PT End of Session - 06/29/23 1534     Visit Number 4    Date for PT Re-Evaluation 07/01/23    Authorization Type [employer]BS and Medicaid    Authorization Time Period 6/24-9/15    Authorization - Visit Number 2    Authorization - Number of Visits 12    PT Start Time 1530    PT Stop Time 1610    PT Time Calculation (min) 40 min    Activity Tolerance Patient tolerated treatment well    Behavior During Therapy [facility] for tasks assessed/performed             Past Medical History:  Diagnosis Date   Abnormal pap smears 06/16/2013   Asthma    DVT, lower extremity, proximal (HCC)    Endometriosis    Past Surgical History:  Procedure Laterality Date   laproscopy  2011   LEEP N/A 12/2019   Patient Active Problem List   Diagnosis Date Noted   Chronic constipation 05/09/2020   History of DVT (deep vein thrombosis) 05/02/2020   Dysplasia of cervix, 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   Low grade squamous intraepith lesion on cytologic smear cervix (lgsil) 11/09/2019   Asthma, chronic 06/22/2013   Migraine, unspecified 06/22/2013   Arthralgia 06/13/2013   Endometriosis 06/13/2013   Edema of both legs 06/13/2013   Weight gain 06/13/2013    PCP: Claiborne Rigg, NP  REFERRING PROVIDER: Reva Bores, MD   REFERRING DIAG:  K59.09 (ICD-10-CM) - Chronic constipation  M54.32 (ICD-10-CM) - Left sciatic nerve pain    THERAPY DIAG:  Muscle weakness (generalized)  Pelvic pain  Rationale for Evaluation and Treatment: Rehabilitation  ONSET DATE: 8/23  SUBJECTIVE:                                                                                                                                                                                           SUBJECTIVE STATEMENT The pain is doing better. Patient has had  several bowel movements today. The lower tailbone is a bit irritated.     PAIN:  Are you having pain? Yes NPRS scale: 3/10 Pain location:  left lumbar to the left calf  Pain type: throbbing and sore muscles Pain description: intermittent   Aggravating factors: sitting on commode, sitting at work on a cushion Relieving factors: not sitting on left buttocks, reduce pressure on the left leg  PAIN:  Are you having pain? Yes: NPRS scale: 4/10 Pain location: right lower abdominal  Pain description: sharp, shooting, throbbing Aggravating factors: sitting, back up in the colon Relieving factors: heat, pain medication   PRECAUTIONS: None  WEIGHT BEARING RESTRICTIONS: No  FALLS:  Has patient fallen in last 6 months? No  LIVING ENVIRONMENT: Lives with: lives alone  OCCUPATION: sitting job  PLOF: Independent  PATIENT GOALS: improve pain management, improve flexibility  PERTINENT HISTORY:  Chronic constipation, endometriosis    BOWEL MOVEMENT: Pain with bowel movement: Yes, 3/10 entire of the movement in the abdomen and using the bathroom actively Type of bowel movement:Type (Bristol Stool Scale) type 1-7, Frequency 1-2 time per week, Strain Yes, and Splinting no , some days has severe diarrhea  Fully empty rectum: No Leakage: No Fiber supplement: No  URINATION: Pain with urination: No Fully empty bladder: Yes: can take awhile, may sit for awhile and more urine will come out Stream:  average Urgency: No Frequency: average Leakage:  none  INTERCOURSE: not active right now    OBJECTIVE:   DIAGNOSTIC FINDINGS:  none  PATIENT SURVEYS:  CRAIQ-7 9  COGNITION: Overall cognitive status: Within functional limits for tasks assessed     SENSATION: Light touch: Appears intact Proprioception: Appears intact   LUMBAR SPECIAL TESTS:  Straight leg raise test: Negative, Slump test: Positive, SI Compression/distraction test: Positive, and Trendelenburg sign:  Negative Posterior glide of left femur in supine has pain relief  POSTURE: No Significant postural limitations  PELVIC ALIGNMENT: Correct alignment  LUMBARAROM/PROM:  A/PROM A/PROM  eval  Flexion full  Extension Decreased by 25% with pain on left  Right lateral flexion Full with pain in the left lumbar  Left lateral flexion Decreased by 25%  Right rotation Decreased by 25%  Left rotation full   (Blank rows = not tested)  LOWER EXTREMITY ROM: Bilateral hip ROM is full   LOWER EXTREMITY MMT:  MMT Right eval Left eval  Hip extension 4/5 3/5  Hip abduction 4/5 3/5  Hip adduction  3/5   PALPATION:   General  Patient has difficulty with full expansion of the lower rib cage, tenderness located throughout the abdomen. Tenderness located in the lumbar,  gluteal,  piriformis,  levator ani                External Perineal Exam tenderness located in the puborectalis, and levator ani                             Internal Pelvic Floor tender in bilateral levator ani and obturator internist. Patient is not able to push the therapist finger out of the anal canal. She does not tighten the rectum just not able to generate the force to push it out and the pelvic floor muscles do not lengthen.   Patient confirms identification and approves PT to assess internal pelvic floor and treatment Yes  PELVIC MMT:   MMT eval  Internal Anal Sphincter 2/5  External Anal Sphincter 2/5  Puborectalis 2/5  (Blank rows = not tested)        TONE: Increased    TODAY'S TREATMENT:   06/29/23 Manual: Soft tissue mobilization: Soft tissue mobilization to perineal body and external anal region to relax the tissue Internal pelvic floor techniques: No emotional/communication barriers or cognitive limitation. Patient is motivated to learn. Patient understands and agrees with treatment goals and plan. PT explains patient will be examined in standing, sitting, and lying down to see how their muscles and  joints work. When they are  ready, they will be asked to remove their underwear so PT can examine their perineum. The patient is also given the option of providing their own chaperone as one is not provided in our facility. The patient also has the right and is explained the right to defer or refuse any part of the evaluation or treatment including the internal exam. With the patient's consent, PT will use one gloved finger to gently assess the muscles of the pelvic floor, seeing how well it contracts and relaxes and if there is muscle symmetry. After, the patient will get dressed and PT and patient will discuss exam findings and plan of care. PT and patient discuss plan of care, schedule, attendance policy and HEP activities.  Going through the rectum working on the annococcygeal ligament, along the puborectalis, along the obturator internist Neuromuscular re-education: Pelvic floor contraction training: Therapist finger in the rectum working on pelvic drop with diaphragmatic breathing  Therapist finger in the rectum working on pushing the therapist finger out of the rectum with pelvic floor relaxation and generating enough force Exercises: Stretches/mobility: Marjo Bicker pose with pelvic drop to lengthen the muscles Quadruped with hips internally rotated  Supine hip internal and external rotation  06/22/23 Manual: Soft tissue mobilization: To assess for dry needling Manual work to the perineal body puborectalis, obturator internist, and superior transverse while patient performs diaphragmatic breathing  Trigger Point Dry-Needling  Treatment instructions: Expect mild to moderate muscle soreness. S/S of pneumothorax if dry needled over a lung field, and to seek immediate medical attention should they occur. Patient verbalized understanding of these instructions and education.  Patient Consent Given: Yes Education handout provided: Previously provided Muscles treated: perineal body Electrical  stimulation performed: No Parameters: N/A Treatment response/outcome: elongation of muscle and trigger point response    05/22/23 Manual: Myofascial release: Using the suction cup to the abdomen to release the fascial restrictions Release of the mesenteric root Release of the suprapubic area Release of the right lower quadrant                                                                                                                              PATIENT EDUCATION: 06/29/23 Education details: Access Code: [email or web address] Person educated: Patient Education method: Explanation, Demonstration, Tactile cues, Verbal cues, and Handouts Education comprehension: verbalized understanding, returned demonstration, verbal cues required, tactile cues required, and needs further education   HOME EXERCISE PROGRAM: 06/29/23 Access Code: [email or web address] URL: [email or web address] Date: 06/29/2023 Prepared by: Eulis Foster  Exercises - Child's Pose Stretch  - 1 x daily - 7 x weekly - 1 sets - 10 reps - Quadruped Rocking Slow  - 1 x daily - 7 x weekly - 1 sets - 10 reps - Supine Hip Internal and External Rotation  - 1 x daily - 7 x weekly - 1 sets - 10 reps  ASSESSMENT:  CLINICAL IMPRESSION: Patient is a 37  y.o. female who was seen today for physical therapy  treatment for sciatica and constipation.  Patient pain level is less today and she has had several bowel movements already. Patient was able to perform pelvic drop. She was able to push the therapist finger out of the rectum for first time. Patient will benefit from skilled therapy to improve pelvic floor lengthen to reduce trigger points and improve constipation while reducing left leg pain.   OBJECTIVE IMPAIRMENTS: decreased coordination, decreased endurance, decreased ROM, decreased strength, increased fascial restrictions, increased muscle spasms, and pain.   ACTIVITY LIMITATIONS: sitting and toileting  PARTICIPATION LIMITATIONS:  occupation  PERSONAL FACTORS: Time since onset of injury/illness/exacerbation and 1-2 comorbidities: Chronic constipation, endimetriosis  are also affecting patient's functional outcome.   REHAB POTENTIAL: Good  CLINICAL DECISION MAKING: Evolving/moderate complexity  EVALUATION COMPLEXITY: Moderate   GOALS: Goals reviewed with patient? Yes  SHORT TERM GOALS: Target date: 05/04/23  Patient independent with initial HEP for pelvic floor lengthening and stretching program.  Baseline:Not educated yet Goal status: Met 06/29/23   LONG TERM GOALS: Target date: 07/01/23  Patient independent with advanced HEP for pelvic floor, back and leg exercises.  Baseline: Not educated Goal status: INITIAL  2.  Patient able to sit for 45 minutes with pain level not increasing >/= 3/10 due to reduction of trigger points.  Baseline: pain level 7/10 Goal status: INITIAL  3.  Patient is able to push the therapist finger out of the rectum 3 out 5 times due to the ability to lengthen the pelvic floor muscles and generate the pressure to expel the therapist finger.  Baseline: not able to expel the therapist finger.  Goal status: INITIAL  4.  CRAIQ-7 score is </= 15 due to reduction of pain and patient frustration has decreased.  Baseline: CRAIQ-7 67 Goal status: INITIAL  5.  Patient has increased in lumbar ROM due to improve muscle imbalances and improved mobility of the lumbar facets to expel the stool.  Baseline: lumbar ROM decreased by 25% Goal status: INITIAL   PLAN:  PT FREQUENCY: 1x/week  PT DURATION: 12 weeks  PLANNED INTERVENTIONS: Therapeutic exercises, Therapeutic activity, Neuromuscular re-education, Balance training, Patient/Family education, Joint mobilization, Dry Needling, Spinal mobilization, Cryotherapy, Moist heat, Taping, Ultrasound, Biofeedback, and Manual therapy  PLAN FOR NEXT SESSION: manual therapy to lumbar, manual therapy to pelvic floor, work on toiletting, hip strength.    Eulis Foster, PT 06/29/23 4:12 PM

## 2023-07-03 IMAGING — CT CT ABD-PELV W/ CM
2 of 4 series · 16 of 46 positions shown, 18 images · IV contrast (OMNIPAQUE)
Comparison: Pelvic ultrasound on 04/21/2020

CLINICAL DATA: Diffuse abdominal pain 1 year. Nausea. Constipation.
Endometriosis.

EXAM:
CT ABDOMEN AND PELVIS WITH CONTRAST
TECHNIQUE: Multidetector CT imaging of the abdomen and pelvis was performed
using the standard protocol following bolus administration of
intravenous contrast.

[Series 2: axial st · axial · 0.68mm/px · z∈[-176,+229]mm · 13 of 91 slices shown, 15 images]
[im 5/91  soft-tissue]
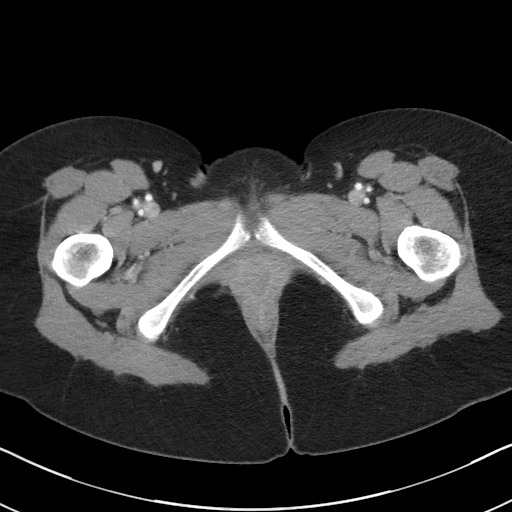
[im 5/91  bone]
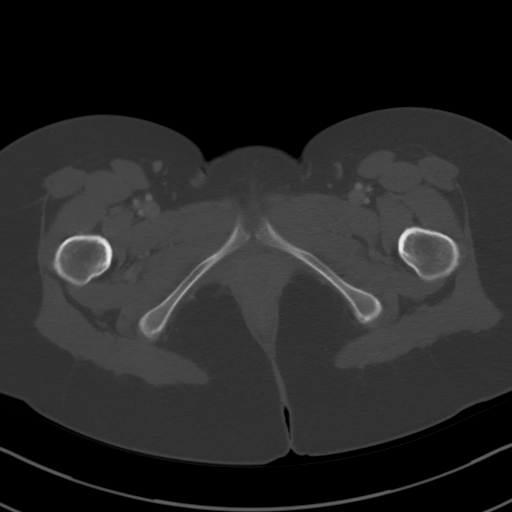
[im 14/91  soft-tissue]
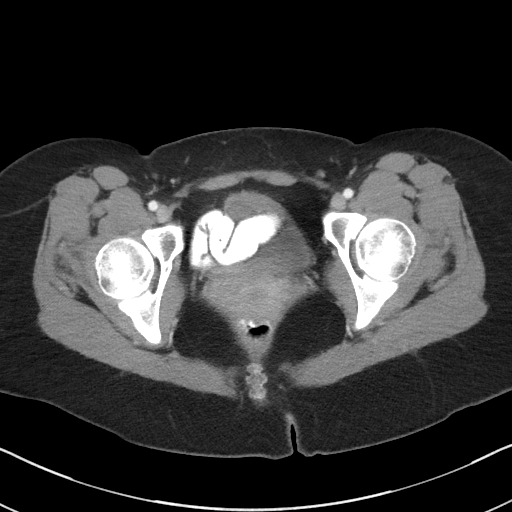
[im 19/91  soft-tissue]
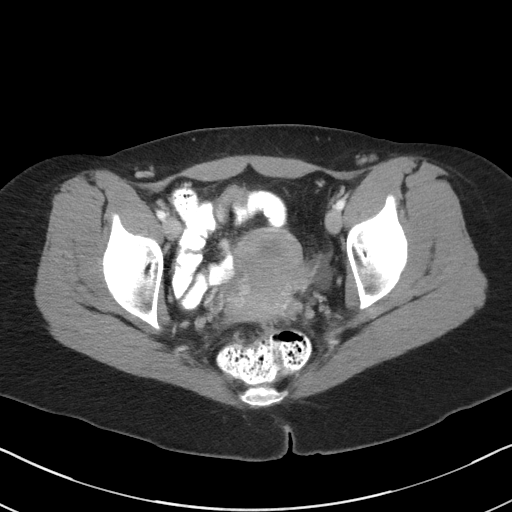
[im 28/91  soft-tissue]
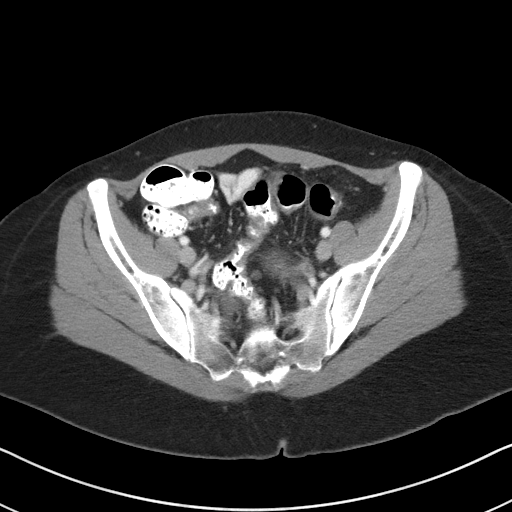
[im 32/91  soft-tissue]
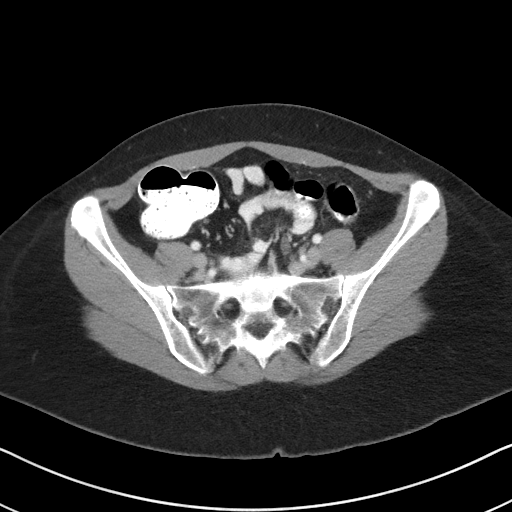
[im 41/91  soft-tissue]
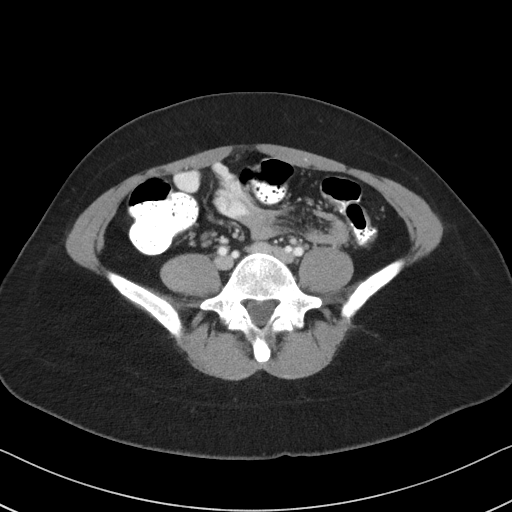
[im 46/91  soft-tissue]
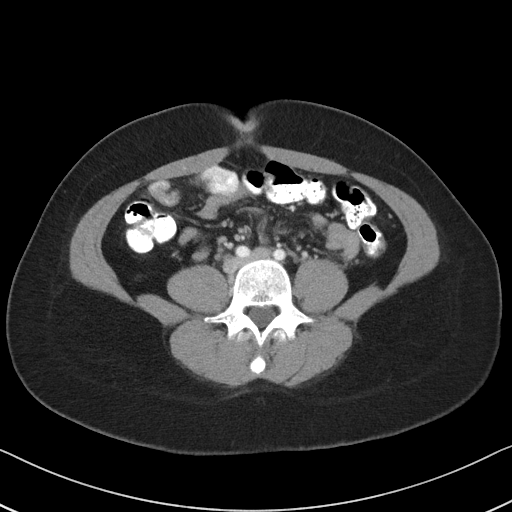
[im 50/91  soft-tissue]
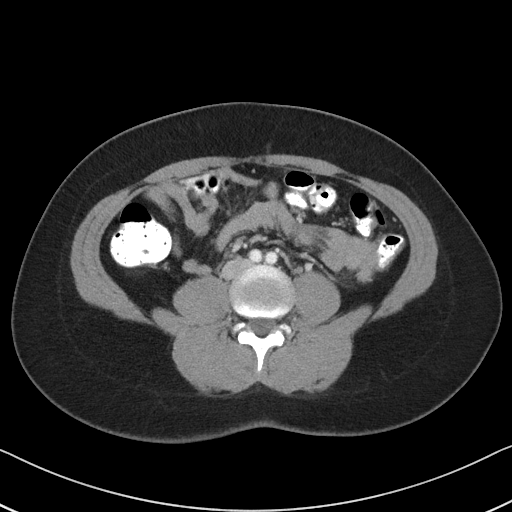
[im 59/91  soft-tissue]
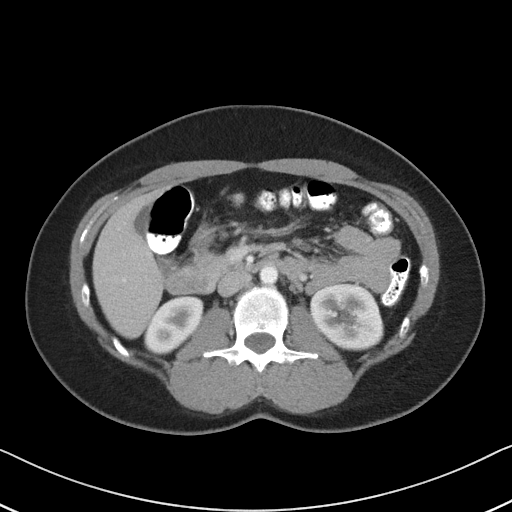
[im 59/91  bone]
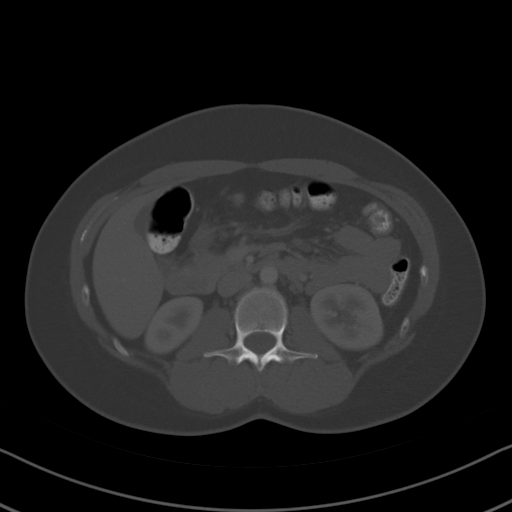
[im 64/91  soft-tissue]
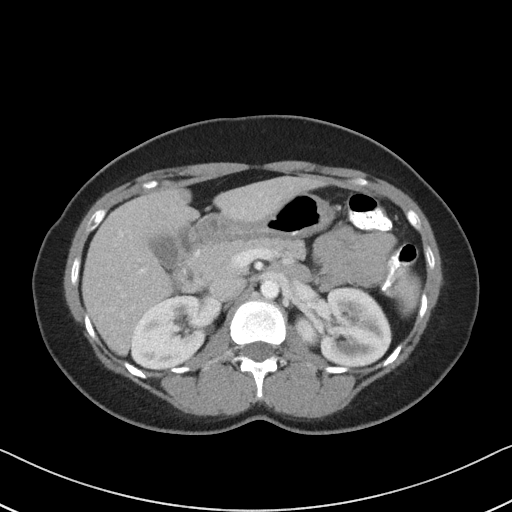
[im 73/91  soft-tissue]
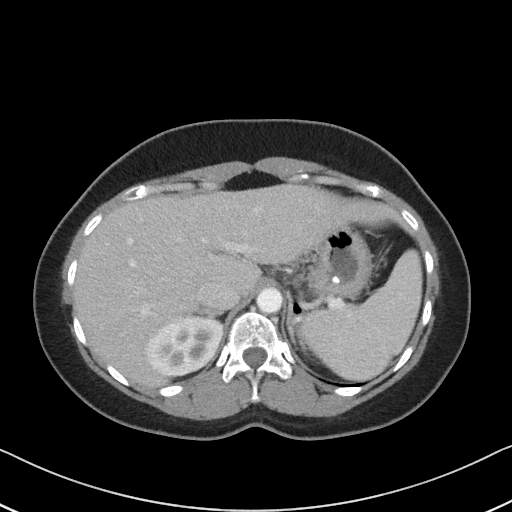
[im 77/91  soft-tissue]
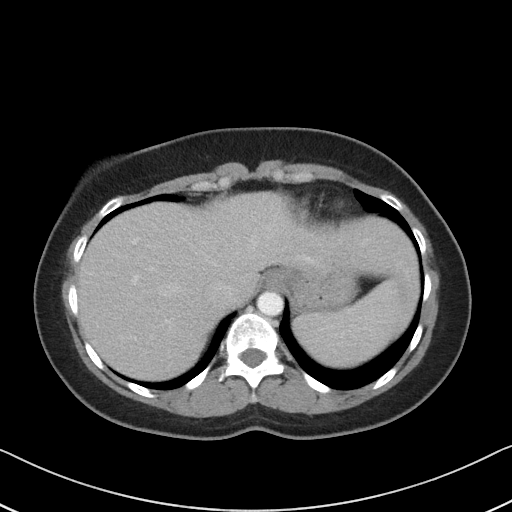
[im 86/91  soft-tissue]
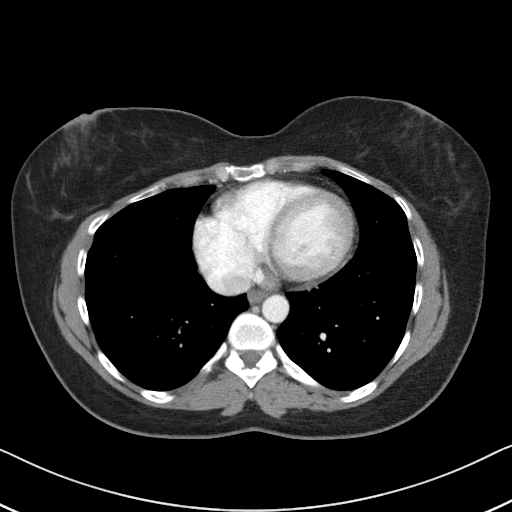

[Series 4: coronal st · coronal · 0.66mm/px · 3 of 72 slices shown]
[im 24/72  soft-tissue]
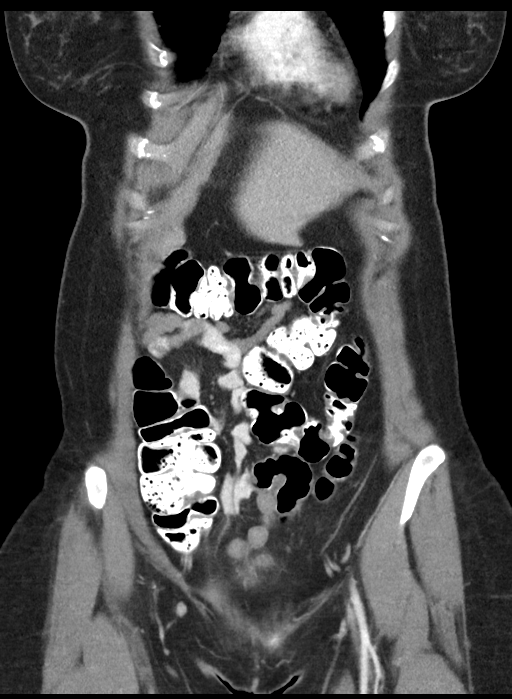
[im 32/72  soft-tissue]
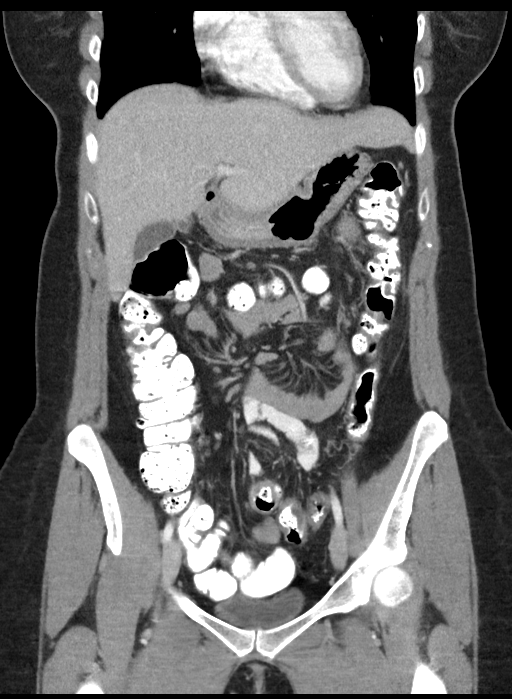
[im 40/72  soft-tissue]
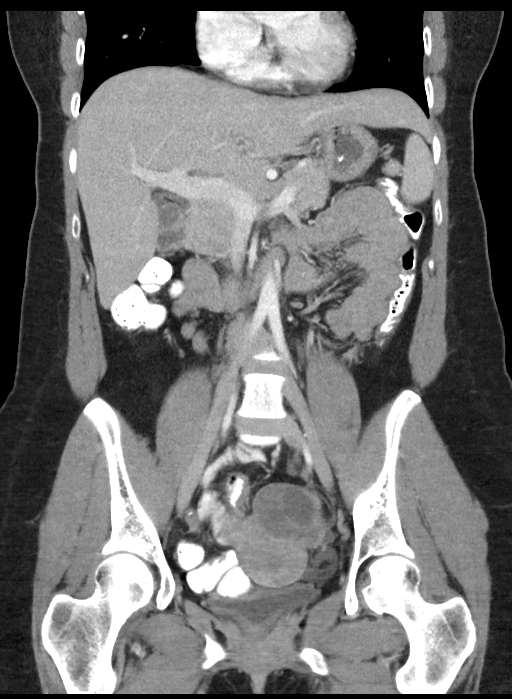

[16 of 46 positions shown; findings below may reference images not displayed]

RADIATION DOSE REDUCTION: This exam was performed according to the
departmental dose-optimization program which includes automated
exposure control, adjustment of the mA and/or kV according to
patient size and/or use of iterative reconstruction technique.

CONTRAST:  100mL OMNIPAQUE IOHEXOL 300 MG/ML  SOLN
FINDINGS: Lower chest: No acute findings.

Hepatobiliary: No mass visualized on this unenhanced exam.
Gallbladder is unremarkable. No evidence of biliary ductal
dilatation.

Pancreas: No mass or inflammatory process visualized on this
unenhanced exam.

Spleen:  Within normal limits in size.

Adrenals/Urinary tract: No evidence of urolithiasis or
hydronephrosis. Unremarkable unopacified urinary bladder.

Stomach/Bowel: No evidence of obstruction, inflammatory process, or
abnormal fluid collections.

Vascular/Lymphatic: No pathologically enlarged lymph nodes
identified. No evidence of abdominal aortic aneurysm.

Reproductive: Normal appearance of uterus. A complex cystic lesion
is seen in the left adnexa which measures 5.0 x 4.1 cm. A complex
cystic lesion is seen in the right adnexa which measures 4.4 x
cm. Both of these lesions show several thin internal septations and
mild peripheral wall thickening, without definite malignant
characteristics. These are both similar in size and appearance to
prior ultrasound. No evidence of free pelvic fluid.

Other:  None.3

Musculoskeletal:  No suspicious bone lesions identified.

:
Bilateral complex cystic adnexal lesions, similar in size and
appearance to prior ultrasound, and likely due to endometriomas
given patient's known history of endometriosis.

## 2023-07-06 ENCOUNTER — Ambulatory Visit: Payer: BC Managed Care – PPO | Admitting: Physical Therapy

## 2023-07-06 ENCOUNTER — Encounter: Payer: Self-pay | Admitting: Physical Therapy

## 2023-07-06 DIAGNOSIS — M5442 Lumbago with sciatica, left side: Secondary | ICD-10-CM

## 2023-07-06 DIAGNOSIS — M5459 Other low back pain: Secondary | ICD-10-CM | POA: Diagnosis not present

## 2023-07-06 DIAGNOSIS — M6281 Muscle weakness (generalized): Secondary | ICD-10-CM

## 2023-07-06 DIAGNOSIS — R102 Pelvic and perineal pain: Secondary | ICD-10-CM

## 2023-07-06 NOTE — Therapy (Signed)
OUTPATIENT PHYSICAL THERAPY FEMALE PELVIC TREATMENT  Patient Name: Lori Mays MRN: [id number] DOB:11-Jul-1986, 37 y.o., female Today's Date: 07/06/2023  END OF SESSION:  PT End of Session - 07/06/23 1545     Visit Number 5    Date for PT Re-Evaluation 07/01/23    Authorization Type BCBS and Medicaid    Authorization Time Period 6/24-9/15    Authorization - Visit Number 3    Authorization - Number of Visits 12    PT Start Time 1545    PT Stop Time 1610    PT Time Calculation (min) 25 min    Activity Tolerance Patient tolerated treatment well    Behavior During Therapy [facility] for tasks assessed/performed             Past Medical History:  Diagnosis Date   Abnormal pap smears 06/16/2013   Asthma    DVT, lower extremity, proximal (HCC)    Endometriosis    Past Surgical History:  Procedure Laterality Date   laproscopy  2011   LEEP N/A 12/2019   Patient Active Problem List   Diagnosis Date Noted   Chronic constipation 05/09/2020   History of DVT (deep vein thrombosis) 05/02/2020   Dysplasia of cervix, 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   Low grade squamous intraepith lesion on cytologic smear cervix (lgsil) 11/09/2019   Asthma, chronic 06/22/2013   Migraine, unspecified 06/22/2013   Arthralgia 06/13/2013   Endometriosis 06/13/2013   Edema of both legs 06/13/2013   Weight gain 06/13/2013    PCP: Claiborne Rigg, NP  REFERRING PROVIDER: Reva Bores, MD   REFERRING DIAG:  K59.09 (ICD-10-CM) - Chronic constipation  M54.32 (ICD-10-CM) - Left sciatic nerve pain    THERAPY DIAG:  Muscle weakness (generalized)  Pelvic pain  Other low back pain  Low back pain with left-sided sciatica, unspecified back pain laterality, unspecified chronicity  Rationale for Evaluation and Treatment: Rehabilitation  ONSET DATE: 8/23  SUBJECTIVE:                                                                                                                                                                                            SUBJECTIVE STATEMENT I have swelling in the left leg. Just discomfort in the lower abdomen.     PAIN:  Are you having pain? Yes NPRS scale: 4/10 Pain location:  left lumbar to the left calf  Pain type: throbbing and sore muscles Pain description: intermittent   Aggravating factors: sitting on commode, sitting at work on a cushion Relieving factors: not sitting on left buttocks, reduce pressure on the left leg  PAIN:  Are you  having pain? Yes: NPRS scale: 4/10 Pain location: right lower abdominal  Pain description: sharp, shooting, throbbing Aggravating factors: sitting, back up in the colon Relieving factors: heat, pain medication   PRECAUTIONS: None  WEIGHT BEARING RESTRICTIONS: No  FALLS:  Has patient fallen in last 6 months? No  LIVING ENVIRONMENT: Lives with: lives alone  OCCUPATION: sitting job  PLOF: Independent  PATIENT GOALS: improve pain management, improve flexibility  PERTINENT HISTORY:  Chronic constipation, endometriosis    BOWEL MOVEMENT: Pain with bowel movement: Yes, 3/10 entire of the movement in the abdomen and using the bathroom actively Type of bowel movement:Type (Bristol Stool Scale) type 1-7, Frequency 1-2 time per week, Strain Yes, and Splinting no , some days has severe diarrhea  Fully empty rectum: No Leakage: No Fiber supplement: No  URINATION: Pain with urination: No Fully empty bladder: Yes: can take awhile, may sit for awhile and more urine will come out Stream:  average Urgency: No Frequency: average Leakage:  none  INTERCOURSE: not active right now    OBJECTIVE:   DIAGNOSTIC FINDINGS:  none  PATIENT SURVEYS:  CRAIQ-7 71  COGNITION: Overall cognitive status: Within functional limits for tasks assessed     SENSATION: Light touch: Appears intact Proprioception: Appears intact   LUMBAR SPECIAL TESTS:  Straight leg raise test: Negative, Slump test: Positive,  SI Compression/distraction test: Positive, and Trendelenburg sign: Negative Posterior glide of left femur in supine has pain relief  POSTURE: No Significant postural limitations  PELVIC ALIGNMENT: Correct alignment  LUMBARAROM/PROM:  A/PROM A/PROM  eval  Flexion full  Extension Decreased by 25% with pain on left  Right lateral flexion Full with pain in the left lumbar  Left lateral flexion Decreased by 25%  Right rotation Decreased by 25%  Left rotation full   (Blank rows = not tested)  LOWER EXTREMITY ROM: Bilateral hip ROM is full   LOWER EXTREMITY MMT:  MMT Right eval Left eval  Hip extension 4/5 3/5  Hip abduction 4/5 3/5  Hip adduction  3/5   PALPATION:   General  Patient has difficulty with full expansion of the lower rib cage, tenderness located throughout the abdomen. Tenderness located in the lumbar,  gluteal,  piriformis,  levator ani                External Perineal Exam tenderness located in the puborectalis, and levator ani                             Internal Pelvic Floor tender in bilateral levator ani and obturator internist. Patient is not able to push the therapist finger out of the anal canal. She does not tighten the rectum just not able to generate the force to push it out and the pelvic floor muscles do not lengthen.   Patient confirms identification and approves PT to assess internal pelvic floor and treatment Yes  PELVIC MMT:   MMT eval  Internal Anal Sphincter 2/5  External Anal Sphincter 2/5  Puborectalis 2/5  (Blank rows = not tested)        TONE: Increased    TODAY'S TREATMENT:   07/06/23 Manual: Spinal mobilization: PA and rotational mobilization to T12-L5 Mobilization of left rib cage posteriorly Muscle energy technique to left SI joint to move sacrum left Distraction of the left SI joint Exercises: Stretches/mobility: Supine piriformis stretch holding 30 sec bil.  Nerve gliding of left leg  Supine trunk rotation holding 30  sec each way Strengthening: Supine marching  with core engaged 20 x  Bridge 10 x     06/29/23 Manual: Soft tissue mobilization: Soft tissue mobilization to perineal body and external anal region to relax the tissue Internal pelvic floor techniques: No emotional/communication barriers or cognitive limitation. Patient is motivated to learn. Patient understands and agrees with treatment goals and plan. PT explains patient will be examined in standing, sitting, and lying down to see how their muscles and joints work. When they are ready, they will be asked to remove their underwear so PT can examine their perineum. The patient is also given the option of providing their own chaperone as one is not provided in our facility. The patient also has the right and is explained the right to defer or refuse any part of the evaluation or treatment including the internal exam. With the patient's consent, PT will use one gloved finger to gently assess the muscles of the pelvic floor, seeing how well it contracts and relaxes and if there is muscle symmetry. After, the patient will get dressed and PT and patient will discuss exam findings and plan of care. PT and patient discuss plan of care, schedule, attendance policy and HEP activities.  Going through the rectum working on the annococcygeal ligament, along the puborectalis, along the obturator internist Neuromuscular re-education: Pelvic floor contraction training: Therapist finger in the rectum working on pelvic drop with diaphragmatic breathing  Therapist finger in the rectum working on pushing the therapist finger out of the rectum with pelvic floor relaxation and generating enough force Exercises: Stretches/mobility: Marjo Bicker pose with pelvic drop to lengthen the muscles Quadruped with hips internally rotated  Supine hip internal and external rotation  06/22/23 Manual: Soft tissue mobilization: To assess for dry needling Manual work to the perineal body  puborectalis, obturator internist, and superior transverse while patient performs diaphragmatic breathing  Trigger Point Dry-Needling  Treatment instructions: Expect mild to moderate muscle soreness. S/S of pneumothorax if dry needled over a lung field, and to seek immediate medical attention should they occur. Patient verbalized understanding of these instructions and education.  Patient Consent Given: Yes Education handout provided: Previously provided Muscles treated: perineal body Electrical stimulation performed: No Parameters: N/A Treatment response/outcome: elongation of muscle and trigger point response                                                 PATIENT EDUCATION: 07/06/23 Education details: Access Code: [email or web address] Person educated: Patient Education method: Explanation, Demonstration, Tactile cues, Verbal cues, and Handouts Education comprehension: verbalized understanding, returned demonstration, verbal cues required, tactile cues required, and needs further education   HOME EXERCISE PROGRAM: 07/06/23 Access Code: [email or web address] URL: [email or web address] Date: 06/29/2023 Prepared by: Eulis Foster  Exercises - Child's Pose Stretch  - 1 x daily - 7 x weekly - 1 sets - 10 reps - Quadruped Rocking Slow  - 1 x daily - 7 x weekly - 1 sets - 10 reps - Supine Hip Internal and External Rotation  - 1 x daily - 7 x weekly - 1 sets - 10 reps  Supine March  - 1 x daily - 7 x weekly - 3 sets - 10 reps ASSESSMENT:  CLINICAL IMPRESSION: Patient is a 37  y.o. female who was seen today for physical therapy  treatment for sciatica and constipation.  Patient pain level  decreased to 1/10 after manual work. She had trouble with bridges due to lumbar pain and increased sway of the hips. She was tight in the left lower rib cage and t12-L5.  Patient has increased in left leg swelling today.  Patient will benefit from skilled therapy to improve pelvic floor lengthen to reduce trigger  points and improve constipation while reducing left leg pain.   OBJECTIVE IMPAIRMENTS: decreased coordination, decreased endurance, decreased ROM, decreased strength, increased fascial restrictions, increased muscle spasms, and pain.   ACTIVITY LIMITATIONS: sitting and toileting  PARTICIPATION LIMITATIONS: occupation  PERSONAL FACTORS: Time since onset of injury/illness/exacerbation and 1-2 comorbidities: Chronic constipation, endimetriosis  are also affecting patient's functional outcome.   REHAB POTENTIAL: Good  CLINICAL DECISION MAKING: Evolving/moderate complexity  EVALUATION COMPLEXITY: Moderate   GOALS: Goals reviewed with patient? Yes  SHORT TERM GOALS: Target date: 05/04/23  Patient independent with initial HEP for pelvic floor lengthening and stretching program.  Baseline:Not educated yet Goal status: Met 06/29/23   LONG TERM GOALS: Target date: 07/01/23  Patient independent with advanced HEP for pelvic floor, back and leg exercises.  Baseline: Not educated Goal status: INITIAL  2.  Patient able to sit for 45 minutes with pain level not increasing >/= 3/10 due to reduction of trigger points.  Baseline: pain level 7/10 Goal status: INITIAL  3.  Patient is able to push the therapist finger out of the rectum 3 out 5 times due to the ability to lengthen the pelvic floor muscles and generate the pressure to expel the therapist finger.  Baseline: not able to expel the therapist finger.  Goal status: INITIAL  4.  CRAIQ-7 score is </= 15 due to reduction of pain and patient frustration has decreased.  Baseline: CRAIQ-7 67 Goal status: INITIAL  5.  Patient has increased in lumbar ROM due to improve muscle imbalances and improved mobility of the lumbar facets to expel the stool.  Baseline: lumbar ROM decreased by 25% Goal status: INITIAL   PLAN:  PT FREQUENCY: 1x/week  PT DURATION: 12 weeks  PLANNED INTERVENTIONS: Therapeutic exercises, Therapeutic activity,  Neuromuscular re-education, Balance training, Patient/Family education, Joint mobilization, Dry Needling, Spinal mobilization, Cryotherapy, Moist heat, Taping, Ultrasound, Biofeedback, and Manual therapy  PLAN FOR NEXT SESSION: manual therapy to lumbar, manual therapy to pelvic floor, work on toiletting, hip strength.   Eulis Foster, PT 07/06/23 3:45 PM

## 2023-07-24 ENCOUNTER — Ambulatory Visit (INDEPENDENT_AMBULATORY_CARE_PROVIDER_SITE_OTHER): Payer: BC Managed Care – PPO

## 2023-07-24 ENCOUNTER — Other Ambulatory Visit: Payer: Self-pay

## 2023-07-24 ENCOUNTER — Other Ambulatory Visit (HOSPITAL_COMMUNITY)
Admission: RE | Admit: 2023-07-24 | Discharge: 2023-07-24 | Disposition: A | Discharge status: Home / Self Care | Payer: BC Managed Care – PPO | Source: Ambulatory Visit | Attending: Family Medicine | Admitting: Family Medicine

## 2023-07-24 VITALS — BP 108/74 | HR 69 | Wt 159.9 lb

## 2023-07-24 DIAGNOSIS — N898 Other specified noninflammatory disorders of vagina: Secondary | ICD-10-CM

## 2023-07-24 DIAGNOSIS — N760 Acute vaginitis: Secondary | ICD-10-CM | POA: Insufficient documentation

## 2023-07-24 DIAGNOSIS — B9689 Other specified bacterial agents as the cause of diseases classified elsewhere: Secondary | ICD-10-CM | POA: Diagnosis not present

## 2023-07-24 MED ORDER — METRONIDAZOLE 500 MG PO TABS: 500.0000 mg | ORAL_TABLET | Freq: Two times a day (BID) | ORAL | 0 refills | Status: DC

## 2023-07-24 NOTE — Progress Notes (Signed)
Lori Mays is here with concern of gray, thick vaginal discharge with slight foul odor. Reports mild lower abdominal cramping (near umbilicus). Noticed the change in discharge following menstrual period approx 2 weeks ago. Reports recurrent BV over past 2 years and single episode of vaginal yeast several years prior. Patient reports symptoms are similar to previous BV. Self swab instructions given and specimen obtained. Explained patient will be contacted with any abnormal results. Flagyl BID x 7 days sent to preferred pharmacy.  Marjo Bicker, RN 07/24/2023  11:56 AM

## 2023-07-27 ENCOUNTER — Other Ambulatory Visit: Payer: Self-pay | Admitting: Obstetrics and Gynecology

## 2023-07-27 DIAGNOSIS — N898 Other specified noninflammatory disorders of vagina: Secondary | ICD-10-CM

## 2023-07-27 MED ORDER — METRONIDAZOLE 500 MG PO TABS: 500.0000 mg | ORAL_TABLET | Freq: Two times a day (BID) | ORAL | 0 refills | Status: DC

## 2023-07-30 ENCOUNTER — Ambulatory Visit (INDEPENDENT_AMBULATORY_CARE_PROVIDER_SITE_OTHER): Payer: BC Managed Care – PPO | Admitting: Physician Assistant

## 2023-07-30 ENCOUNTER — Encounter: Payer: Self-pay | Admitting: Physician Assistant

## 2023-07-30 ENCOUNTER — Other Ambulatory Visit: Payer: BC Managed Care – PPO

## 2023-07-30 DIAGNOSIS — K625 Hemorrhage of anus and rectum: Secondary | ICD-10-CM | POA: Diagnosis not present

## 2023-07-30 DIAGNOSIS — N809 Endometriosis, unspecified: Secondary | ICD-10-CM

## 2023-07-30 DIAGNOSIS — K5900 Constipation, unspecified: Secondary | ICD-10-CM

## 2023-07-30 DIAGNOSIS — R195 Other fecal abnormalities: Secondary | ICD-10-CM

## 2023-07-30 LAB — CBC WITH DIFFERENTIAL/PLATELET
Basophils Absolute: 0 10*3/uL (ref 0.0–0.1)
Basophils Relative: 0.5 % (ref 0.0–3.0)
Eosinophils Absolute: 0.1 10*3/uL (ref 0.0–0.7)
Eosinophils Relative: 1.6 % (ref 0.0–5.0)
HCT: 37.2 % (ref 36.0–46.0)
Hemoglobin: 12.2 g/dL (ref 12.0–15.0)
Lymphocytes Relative: 39.9 % (ref 12.0–46.0)
Lymphs Abs: 2.1 10*3/uL (ref 0.7–4.0)
MCHC: 32.9 g/dL (ref 30.0–36.0)
MCV: 83.8 fl (ref 78.0–100.0)
Monocytes Absolute: 0.4 10*3/uL (ref 0.1–1.0)
Monocytes Relative: 8.2 % (ref 3.0–12.0)
Neutro Abs: 2.6 10*3/uL (ref 1.4–7.7)
Neutrophils Relative %: 49.8 % (ref 43.0–77.0)
Platelets: 252 10*3/uL (ref 150.0–400.0)
RBC: 4.44 Mil/uL (ref 3.87–5.11)
RDW: 14.2 % (ref 11.5–15.5)
WBC: 5.2 10*3/uL (ref 4.0–10.5)

## 2023-07-30 MED ORDER — NA SULFATE-K SULFATE-MG SULF 17.5-3.13-1.6 GM/177ML PO SOLN: 1.0000 | ORAL | 0 refills | Status: DC

## 2023-07-30 MED ORDER — LINACLOTIDE 290 MCG PO CAPS: 290.0000 ug | ORAL_CAPSULE | Freq: Every day | ORAL | 12 refills | Status: AC

## 2023-07-30 NOTE — Progress Notes (Signed)
Agree with assessment and plan as outlined.  

## 2023-07-30 NOTE — Progress Notes (Signed)
Subjective:    Patient ID: Lori Mays, female    DOB: May 18, 1986, 37 y.o.   MRN: [id number]  HPI  Lori Mays is a pleasant 37 year old female, previously established with Dr. Christella Hartigan who was last seen in the office in June 2023 by myself.  At that time she had complaints of lower abdominal pain and constipation in the setting of stage III endometriosis.  She had been on treatment with Dewayne Hatch with previously documented endometriomas but had come off of the [place] for several months in anticipation of undergoing an egg retrieval procedure.  She was complaining of pain more so in the left abdomen radiating around into the left back which has been present for couple of months.  She also has chronic constipation, had been using Linzess at 290 mcg as needed. We did CT of the abdomen and pelvis at that time which did show a complex cystic lesion in the left adnexa measuring 5 x 4.1 cm and a similar-appearing complex lesion in the right adnexa measuring 4.4 x 3.2 cm both consistent with endometriomas. She says that she still has not gone back on Orilissa.  She was contemplating doing IVF but that has not occurred as yet and has not done at retrieval.  She is trying to manage her pain with over-the-counter meds.  She says usually she has more pain in the left abdomen and into her lower back but feel this pain bilaterally as well. She says sometimes it is hard to tell if her pain is intestinal versus due to the endometriosis she will get some discomfort in her upper abdomen if she is constipated.  Has not been taking Linzess because there was some confusion with her insurance about whether they would cover it or not. She has new symptoms now of noticing blood and mucus with almost every bowel movement since May 2024.  She has had blood mixed with the stool and on the tissue, and says sometimes the blood is darker purplish and sometimes red.  She continues to have issues with straining.  She has been doing  pelvic floor physical therapy but that has not made much difference as yet.  She has not had prior colonoscopy. Family history positive for a maternal uncle with colon cancer .  Other medical problems include migraine headaches, asthma and prior history of DVT.  Review of Systems Pertinent positive and negative review of systems were noted in the above HPI section.  All other review of systems was otherwise negative.   Outpatient Encounter Medications as of 07/30/2023  Medication Sig   acetaminophen (TYLENOL) 500 MG tablet Take 2 tablets (1,000 mg total) by mouth every 6 (six) hours as needed.   famotidine (PEPCID) 20 MG tablet Take 20 mg by mouth daily.   ibuprofen (ADVIL) 800 MG tablet TAKE 1 TABLET BY MOUTH EVERY 8 HOURS AS NEEDED   linaclotide (LINZESS) 290 MCG CAPS capsule Take 1 capsule (290 mcg total) by mouth daily before breakfast.   Loratadine (CLARITIN PO) Take by mouth.   Magnesium Hydroxide (DULCOLAX PO) Take by mouth as needed.   metroNIDAZOLE (FLAGYL) 500 MG tablet Take 1 tablet (500 mg total) by mouth 2 (two) times daily.   Na Sulfate-K Sulfate-Mg Sulf (SUPREP BOWEL PREP KIT) 17.5-3.13-1.6 GM/177ML SOLN Take 1 kit by mouth as directed.   polyethylene glycol (MIRALAX / GLYCOLAX) 17 g packet Take 17 g by mouth daily.   PROAIR HFA 108 (90 Base) MCG/ACT inhaler INHALE 1-2 PUFFS INTO THE LUNGS EVERY 6 (  SIX) HOURS AS NEEDED FOR WHEEZING OR SHORTNESS OF BREATH.   VITAMIN D3 1.25 MG (50000 UT) capsule Take 50,000 Units by mouth once a week.   [DISCONTINUED] lubiprostone (AMITIZA) 8 MCG capsule Take 1 capsule (8 mcg total) by mouth 2 (two) times daily with a meal.   No facility-administered encounter medications on file as of 07/30/2023.   Allergies  Allergen Reactions   Diphenhydramine Anaphylaxis    Asthma attacks Asthma attacks   Latex Itching   Patient Active Problem List   Diagnosis Date Noted   Chronic constipation 05/09/2020   History of DVT (deep vein thrombosis)  05/02/2020   Dysplasia of cervix, 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   Low grade squamous intraepith lesion on cytologic smear cervix (lgsil) 11/09/2019   Asthma, chronic 06/22/2013   Migraine, unspecified 06/22/2013   Arthralgia 06/13/2013   Endometriosis 06/13/2013   Edema of both legs 06/13/2013   Weight gain 06/13/2013   Social History   Socioeconomic History   Marital status: Single    Spouse name: Not on file   Number of children: Not on file   Years of education: Not on file   Highest education level: Some college, no degree  Occupational History   Not on file  Tobacco Use   Smoking status: Never   Smokeless tobacco: Never  Vaping Use   Vaping status: Never Used  Substance and Sexual Activity   Alcohol use: Not Currently    Alcohol/week: 1.0 - 2.0 standard drink of alcohol    Types: 1 - 2 Glasses of wine per week    Comment: occassionally   Drug use: No   Sexual activity: Yes    Partners: Male    Birth control/protection: Condom  Other Topics Concern   Not on file  Social History Narrative   Not on file   Social Determinants of Health   Financial Resource Strain: Not on file  Food Insecurity: Food Insecurity Present (07/21/2022)   Hunger Vital Sign    Worried About Running Out of Food in the Last Year: Sometimes true    Ran Out of Food in the Last Year: Sometimes true  Transportation Needs: Unmet Transportation Needs (07/21/2022)   PRAPARE - Administrator, Civil Service (Medical): Yes    Lack of Transportation (Non-Medical): Yes  Physical Activity: Not on file  Stress: Not on file  Social Connections: Not on file  Intimate Partner Violence: Not on file    Lori Mays's family history includes Brain cancer in her paternal grandmother; Breast cancer in her maternal grandmother, paternal aunt, and paternal grandmother.      Objective:    Vitals:   07/30/23 1415  BP: 114/70  Pulse: 81    Physical Exam. Well-developed well-nourished fAA female   in no acute distress.  Height, Weight,159  BMI23.48  HEENT; nontraumatic normocephalic, EOMI, PE R LA, sclera anicteric. Oropharynx;not examined Neck; supple, no JVD Cardiovascular; regular rate and rhythm with S1-S2, no murmur rub or gallop Pulmonary; Clear bilaterally Abdomen; soft, no focal tenderness, nondistended, no palpable mass or hepatosplenomegaly, bowel sounds are active Rectal; not done Skin; benign exam, no jaundice rash or appreciable lesions Extremities; no clubbing cyanosis or edema skin warm and dry Neuro/Psych; alert and oriented x4, grossly nonfocal mood and affect appropriate        Assessment & Plan:   #59 37 year old African-American female with history of endometriosis and previously documented endometriomas of both the left and right adnexa. She has history of  chronic constipation, has not been on any regular regimen recently. Not on any current treatment for the endometriosis as she had been contemplating both IVF and egg retrieval.  Now with new symptom of blood and mucus with bowel movements occurring on an almost daily basis since May 2024.  She continues to have constipation and straining.  She has noticed blood both mixed with the bowel movement and on the tissue.  Etiology of the bloody mucus is not clear at this time, she may have internal hemorrhoids, also need to consider consider endometrial implants involving the colon, rule out IBD, rule out neoplasm.  #2 prior history of DVT no current anticoagulation 3.  Asthma 4.  Migraines  Plan; patient will be scheduled for colonoscopy with Dr. Adela Lank.  Colonoscopy was discussed in detail with the patient including indications risk and benefits and she is agreeable to proceed. Will restart Linzess 290 mcg p.o. daily, she was given samples and a new prescription today. Check CBC  Further recommendations pending results of above.    Chalmers Iddings Oswald Hillock PA-C 07/30/2023   Cc: Claiborne Rigg, NP

## 2023-07-30 NOTE — Patient Instructions (Addendum)
_______________________________________________________  If your blood pressure at your visit was 140/90 or greater, please contact your primary care physician to follow up on this.  If you are age 37 or younger, your body mass index should be between 19-25. Your Body mass index is 23.48 kg/m. If this is out of the aformentioned range listed, please consider follow up with your Primary Care Provider.  ________________________________________________________  The [facility] providers would like to encourage you to use [facility] to communicate with providers for non-urgent requests or questions.  Due to long hold times on the telephone, sending your provider a message by [facility] may be a faster and more efficient way to get a response.  Please allow 48 business hours for a response.  Please remember that this is for non-urgent requests.  _______________________________________________________  We have sent the following medications to your pharmacy for you to pick up at your convenience:  START: Linzess 290 mcg one capsule daily before breakfast.  Linzess works best when taken once a day every day, on an empty stomach, at least 30 minutes before your first meal of the day.  When Linzess is taken daily as directed:  *Constipation relief is typically felt in about a week *IBS-C patients may begin to experience relief from belly pain and overall abdominal symptoms (pain, discomfort, and bloating) in about 1 week,   with symptoms typically improving over 12 weeks.  Diarrhea may occur in the first 2 weeks -keep taking it.  The diarrhea should go away and you should start having normal, complete, full bowel movements. It may be helpful to start treatment when you can be near the comfort of your own bathroom, such as a weekend.   Your provider has requested that you go to the basement level for lab work before leaving today. Press "B" on the elevator. The lab is located at the first door on the left  as you exit the elevator.  You have been scheduled for a colonoscopy. Please follow written instructions given to you at your visit today.   Please pick up your prep supplies at the pharmacy within the next 1-3 days.  If you use inhalers (even only as needed), please bring them with you on the day of your procedure.  DO NOT TAKE 7 DAYS PRIOR TO TEST- Trulicity (dulaglutide) Ozempic, Wegovy (semaglutide) Mounjaro (tirzepatide) Bydureon Bcise (exanatide extended release)  DO NOT TAKE 1 DAY PRIOR TO YOUR TEST Rybelsus (semaglutide) Adlyxin (lixisenatide) Victoza (liraglutide) Byetta (exanatide) ___________________________________________________________________________  Due to recent changes in healthcare laws, you may see the results of your imaging and laboratory studies on MyChart before your provider has had a chance to review them.  We understand that in some cases there may be results that are confusing or concerning to you. Not all laboratory results come back in the same time frame and the provider may be waiting for multiple results in order to interpret others.  Please give [place] 48 hours in order for your provider to thoroughly review all the results before contacting the office for clarification of your results.   Thank you for entrusting me with your care and choosing [facility]  Amy Esterwood, PA-C

## 2023-08-24 ENCOUNTER — Encounter: Payer: Self-pay | Admitting: Physical Therapy

## 2023-08-24 ENCOUNTER — Ambulatory Visit: Payer: BC Managed Care – PPO | Attending: Family Medicine | Admitting: Physical Therapy

## 2023-08-24 DIAGNOSIS — R102 Pelvic and perineal pain: Secondary | ICD-10-CM | POA: Insufficient documentation

## 2023-08-24 DIAGNOSIS — M6281 Muscle weakness (generalized): Secondary | ICD-10-CM | POA: Insufficient documentation

## 2023-08-24 NOTE — Therapy (Signed)
OUTPATIENT PHYSICAL THERAPY FEMALE PELVIC TREATMENT  Patient Name: Lori Mays MRN: [id number] DOB:1986-12-03, 37 y.o., female Today's Date: 08/24/2023  END OF SESSION:  PT End of Session - 08/24/23 1623     Visit Number 6    Date for PT Re-Evaluation 10/21/23    Authorization Type [employer] and Medicaid    Authorization Time Period 6/24-9/15    Authorization - Visit Number 4    Authorization - Number of Visits 12    PT Start Time 1621    PT Stop Time 1700    PT Time Calculation (min) 39 min    Activity Tolerance Patient tolerated treatment well    Behavior During Therapy [facility] for tasks assessed/performed             Past Medical History:  Diagnosis Date   Abnormal pap smears 06/16/2013   Asthma    DVT, lower extremity, proximal (HCC)    Endometriosis    Past Surgical History:  Procedure Laterality Date   laproscopy  2011   LEEP N/A 12/2019   Patient Active Problem List   Diagnosis Date Noted   Chronic constipation 05/09/2020   History of DVT (deep vein thrombosis) 05/02/2020   Dysplasia of cervix, 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   Low grade squamous intraepith lesion on cytologic smear cervix (lgsil) 11/09/2019   Asthma, chronic 06/22/2013   Migraine, unspecified 06/22/2013   Arthralgia 06/13/2013   Endometriosis 06/13/2013   Edema of both legs 06/13/2013   Weight gain 06/13/2013    PCP: Claiborne Rigg, NP  REFERRING PROVIDER: Reva Bores, MD   REFERRING DIAG:  K59.09 (ICD-10-CM) - Chronic constipation  M54.32 (ICD-10-CM) - Left sciatic nerve pain    THERAPY DIAG:  Muscle weakness (generalized) - Plan: PT plan of care cert/re-cert  Pelvic pain - Plan: PT plan of care cert/re-cert  Rationale for Evaluation and Treatment: Rehabilitation  ONSET DATE: 8/23  SUBJECTIVE:                                                                                                                                                                                            SUBJECTIVE STATEMENT Constipation has been a little bit better. I am using samples of Linzess.  I have changed the way I sit a work. I just get pain in the left knee. I see my GI end of next month.     PAIN:  Are you having pain? Yes NPRS scale: 4/10 Pain location:  left lumbar to the left calf  Pain type: throbbing and sore muscles Pain description: intermittent   Aggravating factors: sitting on commode, sitting at  work on a cushion Relieving factors: not sitting on left buttocks, reduce pressure on the left leg  PAIN:  Are you having pain? Yes: NPRS scale: 4/10 Pain location: right lower abdominal  Pain description: sharp, shooting, throbbing Aggravating factors: sitting, back up in the colon Relieving factors: heat, pain medication   PRECAUTIONS: None  WEIGHT BEARING RESTRICTIONS: No  FALLS:  Has patient fallen in last 6 months? No  LIVING ENVIRONMENT: Lives with: lives alone  OCCUPATION: sitting job  PLOF: Independent  PATIENT GOALS: improve pain management, improve flexibility  PERTINENT HISTORY:  Chronic constipation, endometriosis    BOWEL MOVEMENT: Pain with bowel movement: Yes, 3/10 entire of the movement in the abdomen and using the bathroom actively; ;patient has to wiggle on the toilet to get the stool out.  Type of bowel movement:Type (Bristol Stool Scale) type 1-7, Frequency 1-2 time per week, Strain Yes, and Splinting no , some days has severe diarrhea  Fully empty rectum: No Leakage: No Fiber supplement: No  URINATION: Pain with urination: No Fully empty bladder: Yes: can take awhile, may sit for awhile and more urine will come out Stream:  average Urgency: No Frequency: average Leakage:  none  INTERCOURSE: not active right now    OBJECTIVE:   DIAGNOSTIC FINDINGS:  none  PATIENT SURVEYS:  CRAIQ-7 24  COGNITION: Overall cognitive status: Within functional limits for tasks assessed     SENSATION: Light touch: Appears  intact Proprioception: Appears intact   LUMBAR SPECIAL TESTS:  Straight leg raise test: Negative, Slump test: Positive, SI Compression/distraction test: Positive, and Trendelenburg sign: Negative Posterior glide of left femur in supine has pain relief  POSTURE: No Significant postural limitations  PELVIC ALIGNMENT: Correct alignment  LUMBARAROM/PROM:  A/PROM A/PROM  eval  Flexion full  Extension Decreased by 25% with pain on left  Right lateral flexion Full with pain in the left lumbar  Left lateral flexion Decreased by 25%  Right rotation Decreased by 25%  Left rotation full   (Blank rows = not tested)  LOWER EXTREMITY ROM: Bilateral hip ROM is full   LOWER EXTREMITY MMT:  MMT Right eval Left eval Right 08/24/23 Left  08/24/23  Hip extension 4/5 3/5 4/5 4/5  Hip abduction 4/5 3/5 3/5 3/5  Hip adduction  3/5 3+/5 3+/5   PALPATION:   General  Patient has difficulty with full expansion of the lower rib cage, tenderness located throughout the abdomen. Tenderness located in the lumbar,  gluteal,  piriformis,  levator ani                External Perineal Exam tenderness located in the puborectalis, and levator ani                             Internal Pelvic Floor tender in bilateral levator ani and obturator internist. Patient is not able to push the therapist finger out of the anal canal. She does not tighten the rectum just not able to generate the force to push it out and the pelvic floor muscles do not lengthen.   Patient confirms identification and approves PT to assess internal pelvic floor and treatment Yes  PELVIC MMT:   MMT eval 08/24/23  Internal Anal Sphincter 2/5 2/5  External Anal Sphincter 2/5 2/5  Puborectalis 2/5 2/5  (Blank rows = not tested)        TONE: Increased    TODAY'S TREATMENT:   08/24/23 Manual: Internal pelvic floor  techniques: No emotional/communication barriers or cognitive limitation. Patient is motivated to learn. Patient  understands and agrees with treatment goals and plan. PT explains patient will be examined in standing, sitting, and lying down to see how their muscles and joints work. When they are ready, they will be asked to remove their underwear so PT can examine their perineum. The patient is also given the option of providing their own chaperone as one is not provided in our facility. The patient also has the right and is explained the right to defer or refuse any part of the evaluation or treatment including the internal exam. With the patient's consent, PT will use one gloved finger to gently assess the muscles of the pelvic floor, seeing how well it contracts and relaxes and if there is muscle symmetry. After, the patient will get dressed and PT and patient will discuss exam findings and plan of care. PT and patient discuss plan of care, schedule, attendance policy and HEP activities.  Going through rectum working on the puborectalis, levator ani, anococcygeal ligament to lengthen the pelvic floor  Neuromuscular re-education: Pelvic floor contraction training: Therapist finger in the rectum working on expanding the pelvic floor and pushing the therapist out of the rectum with diaphragmatic breathing.  Exercises: Stretches/mobility: Neural tension stretch for LE 5 x each leg Quadruped rock back and forth with hips internally rotated.  Quadruped with knee on block and rocking side to side to mobilize the SI joint.     07/06/23 Manual: Spinal mobilization: PA and rotational mobilization to T12-L5 Mobilization of left rib cage posteriorly Muscle energy technique to left SI joint to move sacrum left Distraction of the left SI joint Exercises: Stretches/mobility: Supine piriformis stretch holding 30 sec bil.  Nerve gliding of left leg  Supine trunk rotation holding 30 sec each way Strengthening: Supine marching  with core engaged 20 x  Bridge 10 x     06/29/23 Manual: Soft tissue mobilization: Soft  tissue mobilization to perineal body and external anal region to relax the tissue Internal pelvic floor techniques: No emotional/communication barriers or cognitive limitation. Patient is motivated to learn. Patient understands and agrees with treatment goals and plan. PT explains patient will be examined in standing, sitting, and lying down to see how their muscles and joints work. When they are ready, they will be asked to remove their underwear so PT can examine their perineum. The patient is also given the option of providing their own chaperone as one is not provided in our facility. The patient also has the right and is explained the right to defer or refuse any part of the evaluation or treatment including the internal exam. With the patient's consent, PT will use one gloved finger to gently assess the muscles of the pelvic floor, seeing how well it contracts and relaxes and if there is muscle symmetry. After, the patient will get dressed and PT and patient will discuss exam findings and plan of care. PT and patient discuss plan of care, schedule, attendance policy and HEP activities.  Going through the rectum working on the annococcygeal ligament, along the puborectalis, along the obturator internist Neuromuscular re-education: Pelvic floor contraction training: Therapist finger in the rectum working on pelvic drop with diaphragmatic breathing  Therapist finger in the rectum working on pushing the therapist finger out of the rectum with pelvic floor relaxation and generating enough force Exercises: Stretches/mobility: Marjo Bicker pose with pelvic drop to lengthen the muscles Quadruped with hips internally rotated  Supine hip internal  and external rotation                          PATIENT EDUCATION: 07/06/23 Education details: Access Code: [email or web address] Person educated: Patient Education method: Explanation, Demonstration, Tactile cues, Verbal cues, and Handouts Education comprehension: verbalized  understanding, returned demonstration, verbal cues required, tactile cues required, and needs further education   HOME EXERCISE PROGRAM: 07/06/23 Access Code: [email or web address] URL: [email or web address] Date: 06/29/2023 Prepared by: Eulis Foster  Exercises - Child's Pose Stretch  - 1 x daily - 7 x weekly - 1 sets - 10 reps - Quadruped Rocking Slow  - 1 x daily - 7 x weekly - 1 sets - 10 reps - Supine Hip Internal and External Rotation  - 1 x daily - 7 x weekly - 1 sets - 10 reps  Supine March  - 1 x daily - 7 x weekly - 3 sets - 10 reps ASSESSMENT:  CLINICAL IMPRESSION: Patient is a 37  y.o. female who was seen today for physical therapy  treatment for sciatica and constipation.  Patient is having less pain overall. She is able to expand the pelvic floor with diaphragmatic breathing. She does have some difficulty with pushing the therapist finger out of the anus and was able to do it 2 times out of 10. She has increased tension in the pelvic floor. Pelvic floor strength is 2/5 and not able to hold more than 2-3 seconds.  Patient will benefit from skilled therapy to improve pelvic floor lengthen to reduce trigger points and improve constipation while reducing left leg pain.   OBJECTIVE IMPAIRMENTS: decreased coordination, decreased endurance, decreased ROM, decreased strength, increased fascial restrictions, increased muscle spasms, and pain.   ACTIVITY LIMITATIONS: sitting and toileting  PARTICIPATION LIMITATIONS: occupation  PERSONAL FACTORS: Time since onset of injury/illness/exacerbation and 1-2 comorbidities: Chronic constipation, endimetriosis  are also affecting patient's functional outcome.   REHAB POTENTIAL: Good  CLINICAL DECISION MAKING: Evolving/moderate complexity  EVALUATION COMPLEXITY: Moderate   GOALS: Goals reviewed with patient? Yes  SHORT TERM GOALS: Target date: 05/04/23  Patient independent with initial HEP for pelvic floor lengthening and stretching  program.  Baseline:Not educated yet Goal status: Met 06/29/23   LONG TERM GOALS: Target date: 10/21/23  Patient independent with advanced HEP for pelvic floor, back and leg exercises.  Baseline: Not educated Goal status: IN PROGRESS, 08/24/23  2.  Patient able to sit for 45 minutes with pain level not increasing >/= 3/10 due to reduction of trigger points.  Baseline: pain level 7/10 Goal status: IN PROGRESS, 08/24/23  3.  Patient is able to push the therapist finger out of the rectum 3 out 5 times due to the ability to lengthen the pelvic floor muscles and generate the pressure to expel the therapist finger.  Baseline: not able to expel the therapist finger.  Goal status: IN PROGRESS 08/24/23  4.  CRAIQ-7 score is </= 15 due to reduction of pain and patient frustration has decreased.  Baseline: CRAIQ-7 67 Goal status: IN PROGRESS 08/24/23  5.  Patient has increased in lumbar ROM due to improve muscle imbalances and improved mobility of the lumbar facets to expel the stool.  Baseline: lumbar ROM decreased by 25% Goal status: IN PROGRESS 08/24/23   PLAN:  PT FREQUENCY: 1x/week  PT DURATION: other: 4 months  PLANNED INTERVENTIONS: Therapeutic exercises, Therapeutic activity, Neuromuscular re-education, Balance training, Patient/Family education, Joint mobilization, Dry Needling, Spinal mobilization, Cryotherapy, Moist heat, Taping, Ultrasound, Biofeedback, and Manual  therapy  PLAN FOR NEXT SESSION: manual therapy to lumbar, manual therapy to pelvic floor, work on toiletting, hip strength. Work on [employer] joint mobilization  Eulis Foster, [place] 08/24/23 5:14 PM

## 2023-09-02 ENCOUNTER — Encounter: Payer: Self-pay | Admitting: Physical Therapy

## 2023-09-02 ENCOUNTER — Ambulatory Visit: Payer: BC Managed Care – PPO | Attending: Family Medicine | Admitting: Physical Therapy

## 2023-09-02 DIAGNOSIS — R102 Pelvic and perineal pain: Secondary | ICD-10-CM | POA: Diagnosis not present

## 2023-09-02 DIAGNOSIS — M6281 Muscle weakness (generalized): Secondary | ICD-10-CM | POA: Diagnosis not present

## 2023-09-02 DIAGNOSIS — M5459 Other low back pain: Secondary | ICD-10-CM | POA: Diagnosis not present

## 2023-09-02 NOTE — Therapy (Signed)
OUTPATIENT PHYSICAL THERAPY FEMALE PELVIC TREATMENT  Patient Name: Lori Mays MRN: [id number] DOB:April 08, 1986, 37 y.o., female Today's Date: 09/02/2023  END OF SESSION:  PT End of Session - 09/02/23 1623     Visit Number 7    Date for PT Re-Evaluation 10/21/23    Authorization Type [employer] and Medicaid    Authorization Time Period 6/24-9/15    Authorization - Visit Number 5    Authorization - Number of Visits 12    PT Start Time 1620    PT Stop Time 1658    PT Time Calculation (min) 38 min    Activity Tolerance Patient tolerated treatment well    Behavior During Therapy [facility] for tasks assessed/performed             Past Medical History:  Diagnosis Date   Abnormal pap smears 06/16/2013   Asthma    DVT, lower extremity, proximal (HCC)    Endometriosis    Past Surgical History:  Procedure Laterality Date   laproscopy  2011   LEEP N/A 12/2019   Patient Active Problem List   Diagnosis Date Noted   Chronic constipation 05/09/2020   History of DVT (deep vein thrombosis) 05/02/2020   Dysplasia of cervix, 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   Low grade squamous intraepith lesion on cytologic smear cervix (lgsil) 11/09/2019   Asthma, chronic 06/22/2013   Migraine, unspecified 06/22/2013   Arthralgia 06/13/2013   Endometriosis 06/13/2013   Edema of both legs 06/13/2013   Weight gain 06/13/2013    PCP: Claiborne Rigg, NP  REFERRING PROVIDER: Reva Bores, MD   REFERRING DIAG:  K59.09 (ICD-10-CM) - Chronic constipation  M54.32 (ICD-10-CM) - Left sciatic nerve pain    THERAPY DIAG:  Muscle weakness (generalized)  Pelvic pain  Other low back pain  Rationale for Evaluation and Treatment: Rehabilitation  ONSET DATE: 8/23  SUBJECTIVE:                                                                                                                                                                                           SUBJECTIVE STATEMENT Overall constipation has  been better. General pain has been more management. I feel like I may need a clean out using naturally foods but may take Linzess.    PAIN:  Are you having pain? Yes NPRS scale: 3/10 Pain location:  left lumbar to the left calf  Pain type: throbbing and sore muscles Pain description: intermittent   Aggravating factors: sitting on commode, sitting at work on a cushion Relieving factors: not sitting on left buttocks, reduce pressure on the left leg  PAIN:  Are you  having pain? Yes: NPRS scale: 3/10 Pain location: right lower abdominal  Pain description: sharp, shooting, throbbing Aggravating factors: sitting, back up in the colon Relieving factors: heat, pain medication   PRECAUTIONS: None  WEIGHT BEARING RESTRICTIONS: No  FALLS:  Has patient fallen in last 6 months? No  LIVING ENVIRONMENT: Lives with: lives alone  OCCUPATION: sitting job  PLOF: Independent  PATIENT GOALS: improve pain management, improve flexibility  PERTINENT HISTORY:  Chronic constipation, endometriosis    BOWEL MOVEMENT: Pain with bowel movement: Yes, 3/10 entire of the movement in the abdomen and using the bathroom actively; ;patient has to wiggle on the toilet to get the stool out.  Type of bowel movement:Type (Bristol Stool Scale) type 1-7, Frequency 1-2 time per week, Strain Yes, and Splinting no , some days has severe diarrhea  Fully empty rectum: No Leakage: No Fiber supplement: No  URINATION: Pain with urination: No Fully empty bladder: Yes: can take awhile, may sit for awhile and more urine will come out Stream:  average Urgency: No Frequency: average Leakage:  none  INTERCOURSE: not active right now    OBJECTIVE:   DIAGNOSTIC FINDINGS:  none  PATIENT SURVEYS:  CRAIQ-7 105  COGNITION: Overall cognitive status: Within functional limits for tasks assessed     SENSATION: Light touch: Appears intact Proprioception: Appears intact   LUMBAR SPECIAL TESTS:  Straight leg  raise test: Negative, Slump test: Positive, SI Compression/distraction test: Positive, and Trendelenburg sign: Negative Posterior glide of left femur in supine has pain relief  POSTURE: No Significant postural limitations  PELVIC ALIGNMENT: Correct alignment  LUMBARAROM/PROM:  A/PROM A/PROM  eval  Flexion full  Extension Decreased by 25% with pain on left  Right lateral flexion Full with pain in the left lumbar  Left lateral flexion Decreased by 25%  Right rotation Decreased by 25%  Left rotation full   (Blank rows = not tested)  LOWER EXTREMITY ROM: Bilateral hip ROM is full   LOWER EXTREMITY MMT:  MMT Right eval Left eval Right 08/24/23 Left  08/24/23  Hip extension 4/5 3/5 4/5 4/5  Hip abduction 4/5 3/5 3/5 3/5  Hip adduction  3/5 3+/5 3+/5   PALPATION:   General  Patient has difficulty with full expansion of the lower rib cage, tenderness located throughout the abdomen. Tenderness located in the lumbar,  gluteal,  piriformis,  levator ani                External Perineal Exam tenderness located in the puborectalis, and levator ani                             Internal Pelvic Floor tender in bilateral levator ani and obturator internist. Patient is not able to push the therapist finger out of the anal canal. She does not tighten the rectum just not able to generate the force to push it out and the pelvic floor muscles do not lengthen.   Patient confirms identification and approves PT to assess internal pelvic floor and treatment Yes  PELVIC MMT:   MMT eval 08/24/23  Internal Anal Sphincter 2/5 2/5  External Anal Sphincter 2/5 2/5  Puborectalis 2/5 2/5  (Blank rows = not tested)        TONE: Increased    TODAY'S TREATMENT:   09/02/23 Manual: Internal pelvic floor techniques: No emotional/communication barriers or cognitive limitation. Patient is motivated to learn. Patient understands and agrees with treatment goals and plan. PT  explains patient will be  examined in standing, sitting, and lying down to see how their muscles and joints work. When they are ready, they will be asked to remove their underwear so PT can examine their perineum. The patient is also given the option of providing their own chaperone as one is not provided in our facility. The patient also has the right and is explained the right to defer or refuse any part of the evaluation or treatment including the internal exam. With the patient's consent, PT will use one gloved finger to gently assess the muscles of the pelvic floor, seeing how well it contracts and relaxes and if there is muscle symmetry. After, the patient will get dressed and PT and patient will discuss exam findings and plan of care. PT and patient discuss plan of care, schedule, attendance policy and HEP activities. Going through the anus working on the puborectalis, levator ani, and mobilize the cervix posteriorly due to feeling it an dit had reduced mobility Dry needling: Neuromuscular re-education: Pelvic floor contraction training: Therapist finger in the rectum working on breathing to expel therapist finger in sidely Exercises: Stretches/mobility: Quadruped rock back and forth with hips internally rotated. Cat cow 20 x Supine bringing knees side to side to stretch for rotation  08/24/23 Manual: Internal pelvic floor techniques: No emotional/communication barriers or cognitive limitation. Patient is motivated to learn. Patient understands and agrees with treatment goals and plan. PT explains patient will be examined in standing, sitting, and lying down to see how their muscles and joints work. When they are ready, they will be asked to remove their underwear so PT can examine their perineum. The patient is also given the option of providing their own chaperone as one is not provided in our facility. The patient also has the right and is explained the right to defer or refuse any part of the evaluation or treatment  including the internal exam. With the patient's consent, PT will use one gloved finger to gently assess the muscles of the pelvic floor, seeing how well it contracts and relaxes and if there is muscle symmetry. After, the patient will get dressed and PT and patient will discuss exam findings and plan of care. PT and patient discuss plan of care, schedule, attendance policy and HEP activities.  Going through rectum working on the puborectalis, levator ani, anococcygeal ligament to lengthen the pelvic floor  Neuromuscular re-education: Pelvic floor contraction training: Therapist finger in the rectum working on expanding the pelvic floor and pushing the therapist out of the rectum with diaphragmatic breathing.  Exercises: Stretches/mobility: Neural tension stretch for LE 5 x each leg Quadruped rock back and forth with hips internally rotated.  Quadruped with knee on block and rocking side to side to mobilize the SI joint.     07/06/23 Manual: Spinal mobilization: PA and rotational mobilization to T12-L5 Mobilization of left rib cage posteriorly Muscle energy technique to left SI joint to move sacrum left Distraction of the left SI joint Exercises: Stretches/mobility: Supine piriformis stretch holding 30 sec bil.  Nerve gliding of left leg  Supine trunk rotation holding 30 sec each way Strengthening: Supine marching  with core engaged 20 x  Bridge 10 x                             PATIENT EDUCATION: 09/02/23 Education details: Access Code: [email or web address] Person educated: Patient Education method: Explanation, Demonstration, Tactile cues, Verbal cues, and Handouts Education  comprehension: verbalized understanding, returned demonstration, verbal cues required, tactile cues required, and needs further education   HOME EXERCISE PROGRAM: 09/02/23 Access Code: [email or web address] URL: [email or web address] Date: 09/02/2023 Prepared by: Eulis Foster  Exercises - Child's Pose Stretch  - 1 x  daily - 7 x weekly - 1 sets - 10 reps - Quadruped Rocking Slow  - 1 x daily - 7 x weekly - 1 sets - 10 reps - Supine Hip Internal and External Rotation  - 1 x daily - 3 x weekly - 1 sets - 10 reps - Supine March  - 1 x daily - 7 x weekly - 1 sets - 10 reps - Cat Cow  - 1 x daily - 7 x weekly - 3 sets - 10 reps   ASSESSMENT:  CLINICAL IMPRESSION: Patient is a 37  y.o. female who was seen today for physical therapy  treatment for sciatica and constipation.  Patient is having less pain overall. She had increased tightness in the puborectalis.  She does have some difficulty with pushing the therapist finger out of the anus and was able to do it 2 times out of 10. She has increased tension in the pelvic floor but able to relax after the manual work.  Pelvic floor strength is 2/5 and not able to hold more than 2-3 seconds.  Patient will benefit from skilled therapy to improve pelvic floor lengthen to reduce trigger points and improve constipation while reducing left leg pain.   OBJECTIVE IMPAIRMENTS: decreased coordination, decreased endurance, decreased ROM, decreased strength, increased fascial restrictions, increased muscle spasms, and pain.   ACTIVITY LIMITATIONS: sitting and toileting  PARTICIPATION LIMITATIONS: occupation  PERSONAL FACTORS: Time since onset of injury/illness/exacerbation and 1-2 comorbidities: Chronic constipation, endimetriosis  are also affecting patient's functional outcome.   REHAB POTENTIAL: Good  CLINICAL DECISION MAKING: Evolving/moderate complexity  EVALUATION COMPLEXITY: Moderate   GOALS: Goals reviewed with patient? Yes  SHORT TERM GOALS: Target date: 05/04/23  Patient independent with initial HEP for pelvic floor lengthening and stretching program.  Baseline:Not educated yet Goal status: Met 06/29/23   LONG TERM GOALS: Target date: 10/21/23  Patient independent with advanced HEP for pelvic floor, back and leg exercises.  Baseline: Not educated Goal  status: IN PROGRESS, 08/24/23  2.  Patient able to sit for 45 minutes with pain level not increasing >/= 3/10 due to reduction of trigger points.  Baseline: pain level 7/10 Goal status: IN PROGRESS, 08/24/23  3.  Patient is able to push the therapist finger out of the rectum 3 out 5 times due to the ability to lengthen the pelvic floor muscles and generate the pressure to expel the therapist finger.  Baseline: not able to expel the therapist finger.  Goal status: IN PROGRESS 08/24/23  4.  CRAIQ-7 score is </= 15 due to reduction of pain and patient frustration has decreased.  Baseline: CRAIQ-7 67 Goal status: IN PROGRESS 08/24/23  5.  Patient has increased in lumbar ROM due to improve muscle imbalances and improved mobility of the lumbar facets to expel the stool.  Baseline: lumbar ROM decreased by 25% Goal status: IN PROGRESS 08/24/23   PLAN:  PT FREQUENCY: 1x/week  PT DURATION: other: 4 months  PLANNED INTERVENTIONS: Therapeutic exercises, Therapeutic activity, Neuromuscular re-education, Balance training, Patient/Family education, Joint mobilization, Dry Needling, Spinal mobilization, Cryotherapy, Moist heat, Taping, Ultrasound, Biofeedback, and Manual therapy  PLAN FOR NEXT SESSION:  manual therapy to pelvic floor, work on toiletting, hip strength. Work on [employer] Therapist, sports  Wallace Cullens, PT 09/02/23 4:59 PM

## 2023-09-08 ENCOUNTER — Encounter: Payer: Self-pay | Admitting: Gastroenterology

## 2023-09-09 ENCOUNTER — Ambulatory Visit: Payer: BC Managed Care – PPO | Admitting: Physical Therapy

## 2023-09-14 ENCOUNTER — Ambulatory Visit: Payer: BC Managed Care – PPO | Admitting: Physical Therapy

## 2023-09-22 ENCOUNTER — Encounter: Payer: Self-pay | Admitting: Gastroenterology

## 2023-09-22 ENCOUNTER — Ambulatory Visit (AMBULATORY_SURGERY_CENTER): Payer: BC Managed Care – PPO | Admitting: Gastroenterology

## 2023-09-22 VITALS — BP 108/65 | HR 67 | Temp 97.3°F | Resp 15 | Ht 69.0 in | Wt 159.0 lb

## 2023-09-22 DIAGNOSIS — K625 Hemorrhage of anus and rectum: Secondary | ICD-10-CM

## 2023-09-22 DIAGNOSIS — R1030 Lower abdominal pain, unspecified: Secondary | ICD-10-CM | POA: Diagnosis not present

## 2023-09-22 LAB — POCT URINE PREGNANCY: Preg Test, Ur: NEGATIVE

## 2023-09-22 MED ORDER — SODIUM CHLORIDE 0.9 % IV SOLN: 500.0000 mL | Freq: Once | INTRAVENOUS | Status: DC

## 2023-09-22 NOTE — Progress Notes (Signed)
A/O x 3, gd SR's, VSS, report to RN

## 2023-09-22 NOTE — Op Note (Signed)
[facility] Patient Name: Lori Mays Procedure Date: 09/22/2023 11:03 AM MRN: [id number] Endoscopist: Viviann Spare P. Adela Lank , MD, [id number] Age: 37 Referring MD:  Date of Birth: 1986-07-25 Gender: Female Account #: [id number] Procedure:                Colonoscopy Indications:              Lower abdominal pain, Rectal bleeding - history of                            constipation, history of endometriosis Medicines:                Monitored Anesthesia Care Procedure:                Pre-Anesthesia Assessment:                           - Prior to the procedure, a History and Physical                            was performed, and patient medications and                            allergies were reviewed. The patient's tolerance of                            previous anesthesia was also reviewed. The risks                            and benefits of the procedure and the sedation                            options and risks were discussed with the patient.                            All questions were answered, and informed consent                            was obtained. Prior Anticoagulants: The patient has                            taken no anticoagulant or antiplatelet agents. ASA                            Grade Assessment: I - A normal, healthy patient.                            After reviewing the risks and benefits, the patient                            was deemed in satisfactory condition to undergo the                            procedure.  After obtaining informed consent, the colonoscope                            was passed under direct vision. Throughout the                            procedure, the patient's blood pressure, pulse, and                            oxygen saturations were monitored continuously. The                            [id number]-HQ190L Colonoscope [id number] was introduced                            through the anus and  advanced to the the terminal                            ileum, with identification of the appendiceal                            orifice and IC valve. The colonoscopy was performed                            without difficulty. The patient tolerated the                            procedure well. The quality of the bowel                            preparation was good. The terminal ileum, ileocecal                            valve, appendiceal orifice, and rectum were                            photographed. Scope In: 11:26:35 AM Scope Out: 11:42:39 AM Scope Withdrawal Time: 0 hours 12 minutes 10 seconds  Total Procedure Duration: 0 hours 16 minutes 4 seconds  Findings:                 The perianal and digital rectal examinations were                            normal.                           The terminal ileum appeared normal.                           A few small-mouthed diverticula were found in the                            ascending colon.  Internal hemorrhoids were found during retroflexion.                           The exam was otherwise without abnormality. Complications:            No immediate complications. Estimated blood loss:                            None. Estimated Blood Loss:     Estimated blood loss: none. Impression:               - The examined portion of the ileum was normal.                           - Diverticulosis in the ascending colon.                           - Internal hemorrhoids.                           - The examination was otherwise normal.                           - No polyps.                           Suspect intermittent bleeding is coming from                            hemorrhoids in the setting of constipation. No                            evidence of colonic endometriotis or polyps, etc. Recommendation:           - Patient has a contact number available for                            emergencies. The signs and  symptoms of potential                            delayed complications were discussed with the                            patient. Return to normal activities tomorrow.                            Written discharge instructions were provided to the                            patient.                           - Resume previous diet.                           - Continue present medications.                           -  Continue Linzess for constipation                           - Can use Calmol4 suppositories as needed (OTC) to                            treat hemorhoids as needed. If symptoms persist                            follow up in the clinic as needed to discuss other                            options.                           - Repeat colonoscopy in 10 years for screening                            purposes. Viviann Spare P. Thurmon Mizell, MD 09/22/2023 11:48:02 AM This report has been signed electronically.

## 2023-09-22 NOTE — Patient Instructions (Addendum)
Resume previous diet Continue present medications Await pathology results  Handouts/information given for diverticulosis and hemorrhoids   YOU HAD AN ENDOSCOPIC PROCEDURE TODAY AT THE [facility]:   Refer to the procedure report that was given to you for any specific questions about what was found during the examination.  If the procedure report does not answer your questions, please call your gastroenterologist to clarify.  If you requested that your care partner not be given the details of your procedure findings, then the procedure report has been included in a sealed envelope for you to review at your convenience later.  YOU SHOULD EXPECT: Some feelings of bloating in the abdomen. Passage of more gas than usual.  Walking can help get rid of the air that was put into your GI tract during the procedure and reduce the bloating. If you had a lower endoscopy (such as a colonoscopy or flexible sigmoidoscopy) you may notice spotting of blood in your stool or on the toilet paper. If you underwent a bowel prep for your procedure, you may not have a normal bowel movement for a few days.  Please Note:  You might notice some irritation and congestion in your nose or some drainage.  This is from the oxygen used during your procedure.  There is no need for concern and it should clear up in a day or so.  SYMPTOMS TO REPORT IMMEDIATELY:  Following lower endoscopy (colonoscopy or flexible sigmoidoscopy):  Excessive amounts of blood in the stool  Significant tenderness or worsening of abdominal pains  Swelling of the abdomen that is new, acute  Fever of 100F or higher  For urgent or emergent issues, a gastroenterologist can be reached at any hour by calling [phone]. Do not use MyChart messaging for urgent concerns.    DIET:  We do recommend a small meal at first, but then you may proceed to your regular diet.  Drink plenty of fluids but you should avoid alcoholic beverages for 24  hours.  ACTIVITY:  You should plan to take it easy for the rest of today and you should NOT DRIVE or use heavy machinery until tomorrow (because of the sedation medicines used during the test).    FOLLOW UP: Our staff will call the number listed on your records the next business day following your procedure.  We will call around 7:15- 8:00 am to check on you and address any questions or concerns that you may have regarding the information given to you following your procedure. If we do not reach you, we will leave a message.     If any biopsies were taken you will be contacted by phone or by letter within the next 1-3 weeks.  Please call us at [phone] if you have not heard about the biopsies in 3 weeks.    SIGNATURES/CONFIDENTIALITY: You and/or your care partner have signed paperwork which will be entered into your electronic medical record.  These signatures attest to the fact that that the information above on your After Visit Summary has been reviewed and is understood.  Full responsibility of the confidentiality of this discharge information lies with you and/or your care-partner.

## 2023-09-22 NOTE — Progress Notes (Signed)
[facility] History and Physical   Primary Care Physician:  Claiborne Rigg, NP   Reason for Procedure:   Rectal bleeding, abdominal pain  Plan:    colonoscopy     HPI: Lori Mays is a 37 y.o. female  here for colonoscopy to evaluate history of lower abdominal pain, rectal bleeding. She has chronic constipation on Linzess. Also with a history of endometriosis. Colonoscopy to clarify source of bleeding / pain   Otherwise feels well without any cardiopulmonary symptoms.   I have discussed risks / benefits of anesthesia and endoscopic procedure with Ralene Ok and they wish to proceed with the exams as outlined today.    Past Medical History:  Diagnosis Date   Abnormal pap smears 06/16/2013   Asthma    DVT, lower extremity, proximal (HCC)    Endometriosis     Past Surgical History:  Procedure Laterality Date   laproscopy  2011   LEEP N/A 12/2019    Prior to Admission medications   Medication Sig Start Date End Date Taking? Authorizing Provider  acetaminophen (TYLENOL) 500 MG tablet Take 2 tablets (1,000 mg total) by mouth every 6 (six) hours as needed. 05/27/22   Anyanwu, Jethro Bastos, MD  famotidine (PEPCID) 20 MG tablet Take 20 mg by mouth daily.    [provider]  ibuprofen (ADVIL) 800 MG tablet TAKE 1 TABLET BY MOUTH EVERY 8 HOURS AS NEEDED 11/04/22   Anyanwu, Jethro Bastos, MD  linaclotide (LINZESS) 290 MCG CAPS capsule Take 1 capsule (290 mcg total) by mouth daily before breakfast. 07/30/23 10/28/23  Esterwood, Amy S, PA-C  Loratadine (CLARITIN PO) Take by mouth.    [provider]  Magnesium Hydroxide (DULCOLAX PO) Take by mouth as needed.    [provider]  metroNIDAZOLE (FLAGYL) 500 MG tablet Take 1 tablet (500 mg total) by mouth 2 (two) times daily. 07/27/23   Winsted Bing, MD  Na Sulfate-K Sulfate-Mg Sulf (SUPREP BOWEL PREP KIT) 17.5-3.13-1.6 GM/177ML SOLN Take 1 kit by mouth as directed. 07/30/23   Esterwood, Amy S, PA-C   polyethylene glycol (MIRALAX / GLYCOLAX) 17 g packet Take 17 g by mouth daily.    [provider]  PROAIR HFA 108 (90 Base) MCG/ACT inhaler INHALE 1-2 PUFFS INTO THE LUNGS EVERY 6 (SIX) HOURS AS NEEDED FOR WHEEZING OR SHORTNESS OF BREATH. 10/03/20   Claiborne Rigg, NP  VITAMIN D3 1.25 MG (50000 UT) capsule Take 50,000 Units by mouth once a week.    [provider]    Current Outpatient Medications  Medication Sig Dispense Refill   acetaminophen (TYLENOL) 500 MG tablet Take 2 tablets (1,000 mg total) by mouth every 6 (six) hours as needed. 120 tablet 1   famotidine (PEPCID) 20 MG tablet Take 20 mg by mouth daily.     ibuprofen (ADVIL) 800 MG tablet TAKE 1 TABLET BY MOUTH EVERY 8 HOURS AS NEEDED 45 tablet 1   linaclotide (LINZESS) 290 MCG CAPS capsule Take 1 capsule (290 mcg total) by mouth daily before breakfast. 30 capsule 12   Loratadine (CLARITIN PO) Take by mouth.     Magnesium Hydroxide (DULCOLAX PO) Take by mouth as needed.     metroNIDAZOLE (FLAGYL) 500 MG tablet Take 1 tablet (500 mg total) by mouth 2 (two) times daily. 14 tablet 0   Na Sulfate-K Sulfate-Mg Sulf (SUPREP BOWEL PREP KIT) 17.5-3.13-1.6 GM/177ML SOLN Take 1 kit by mouth as directed. 324 mL 0   polyethylene glycol (MIRALAX / GLYCOLAX) 17 g  packet Take 17 g by mouth daily.     PROAIR HFA 108 (90 Base) MCG/ACT inhaler INHALE 1-2 PUFFS INTO THE LUNGS EVERY 6 (SIX) HOURS AS NEEDED FOR WHEEZING OR SHORTNESS OF BREATH. 8.5 each 1   VITAMIN D3 1.25 MG (50000 UT) capsule Take 50,000 Units by mouth once a week.     Current Facility-Administered Medications  Medication Dose Route Frequency Provider Last Rate Last Admin   0.9 %  sodium chloride infusion  500 mL Intravenous Once Skila Rollins, Willaim Rayas, MD        Allergies as of 09/22/2023 - Review Complete 09/22/2023  Allergen Reaction Noted   Diphenhydramine Anaphylaxis 08/01/2011   Latex Itching 11/08/2012    Family History  Problem Relation Age of Onset    Breast cancer Paternal Aunt    Breast cancer Maternal Grandmother    Brain cancer Paternal Grandmother    Breast cancer Paternal Grandmother    Colon cancer Neg Hx    Colon polyps Neg Hx    Esophageal cancer Neg Hx    Pancreatic cancer Neg Hx    Stomach cancer Neg Hx     Social History   Socioeconomic History   Marital status: Single    Spouse name: Not on file   Number of children: Not on file   Years of education: Not on file   Highest education level: Some college, no degree  Occupational History   Not on file  Tobacco Use   Smoking status: Never   Smokeless tobacco: Never  Vaping Use   Vaping status: Never Used  Substance and Sexual Activity   Alcohol use: Not Currently    Alcohol/week: 1.0 - 2.0 standard drink of alcohol    Types: 1 - 2 Glasses of wine per week    Comment: occassionally   Drug use: No   Sexual activity: Yes    Partners: Male    Birth control/protection: Condom  Other Topics Concern   Not on file  Social History Narrative   Not on file   Social Determinants of Health   Financial Resource Strain: Not on file  Food Insecurity: Food Insecurity Present (07/21/2022)   Hunger Vital Sign    Worried About Running Out of Food in the Last Year: Sometimes true    Ran Out of Food in the Last Year: Sometimes true  Transportation Needs: Unmet Transportation Needs (07/21/2022)   PRAPARE - Administrator, Civil Service (Medical): Yes    Lack of Transportation (Non-Medical): Yes  Physical Activity: Not on file  Stress: Not on file  Social Connections: Not on file  Intimate Partner Violence: Not on file    Review of Systems: All other review of systems negative except as mentioned in the HPI.  Physical Exam: Vital signs BP 112/74   Pulse 82   Temp (!) 97.3 F (36.3 C) (Temporal)   Ht 5\' 9"  (1.753 m)   Wt 159 lb (72.1 kg)   SpO2 98%   BMI 23.48 kg/m   General:   Alert,  Well-developed, pleasant and cooperative in NAD Lungs:  Clear  throughout to auscultation.   Heart:  Regular rate and rhythm Abdomen:  Soft, nontender and nondistended.   Neuro/Psych:  Alert and cooperative. Normal mood and affect. A and O x 3  Harlin Rain, MD [facility]

## 2023-09-23 ENCOUNTER — Telehealth: Payer: Self-pay

## 2023-09-23 NOTE — Telephone Encounter (Signed)
Left message on answering machine. 

## 2023-10-26 ENCOUNTER — Ambulatory Visit: Payer: BC Managed Care – PPO | Attending: Family Medicine | Admitting: Physical Therapy

## 2023-10-26 ENCOUNTER — Encounter: Payer: Self-pay | Admitting: Physical Therapy

## 2023-10-26 DIAGNOSIS — R102 Pelvic and perineal pain: Secondary | ICD-10-CM | POA: Insufficient documentation

## 2023-10-26 DIAGNOSIS — M6281 Muscle weakness (generalized): Secondary | ICD-10-CM | POA: Insufficient documentation

## 2023-10-26 NOTE — Therapy (Signed)
OUTPATIENT PHYSICAL THERAPY FEMALE PELVIC TREATMENT  Patient Name: Lori Mays MRN: [id number] DOB:[birth year], 37 y.o., female Today's Date: 10/26/2023  END OF SESSION:  PT End of Session - 10/26/23 0943     Visit Number 8    Date for PT Re-Evaluation 12/30/23    Authorization Type BCBS and Medicaid    Authorization Time Period 09/14/2023-12/06/2023    Authorization - Visit Number 1    Authorization - Number of Visits 12    PT Start Time [phone]    PT Stop Time 1010    PT Time Calculation (min) 27 min    Activity Tolerance Patient tolerated treatment well    Behavior During Therapy [facility] for tasks assessed/performed             Past Medical History:  Diagnosis Date   Abnormal pap smears 06/16/2013   Asthma    DVT, lower extremity, proximal (HCC)    Endometriosis    GERD (gastroesophageal reflux disease)    Past Surgical History:  Procedure Laterality Date   laproscopy  2011   LEEP N/A 12/2019   Patient Active Problem List   Diagnosis Date Noted   Chronic constipation 05/09/2020   History of DVT (deep vein thrombosis) 05/02/2020   Dysplasia of cervix, 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   Low grade squamous intraepith lesion on cytologic smear cervix (lgsil) 11/09/2019   Asthma, chronic 06/22/2013   Migraine headache 06/22/2013   Arthralgia 06/13/2013   Endometriosis 06/13/2013   Edema of both legs 06/13/2013   Weight gain 06/13/2013    PCP: Claiborne Rigg, NP  REFERRING PROVIDER: Reva Bores, MD   REFERRING DIAG:  K59.09 (ICD-10-CM) - Chronic constipation  M54.32 (ICD-10-CM) - Left sciatic nerve pain    THERAPY DIAG:  Muscle weakness (generalized) - Plan: PT plan of care cert/re-cert  Pelvic pain - Plan: PT plan of care cert/re-cert  Rationale for Evaluation and Treatment: Rehabilitation  ONSET DATE: 8/23  SUBJECTIVE:                                                                                                                                                                                            SUBJECTIVE STATEMENT I had a colonoscopy finding out I have hemorrhoids. No internal endo in the colon. Pain has been okay. I am finishing my cycle. I have had endometrial fluid. I have had a lot of inflammation. I have not gone to the bathroom very much this week. Patient sees a chiropractor for her back and neck to have adjustments.   PAIN:  Are you having pain? Yes NPRS scale: 6/10 Pain location:  left lumbar to the left calf  Pain type: throbbing and sore muscles Pain description: intermittent   Aggravating factors: sitting on commode, sitting at work on a cushion Relieving factors: not sitting on left buttocks, reduce pressure on the left leg  PAIN:  Are you having pain? Yes: NPRS scale: 7/10 Pain location: right lower abdominal  Pain description: sharp, shooting, throbbing Aggravating factors: sitting, back up in the colon Relieving factors: heat, pain medication   PRECAUTIONS: None  WEIGHT BEARING RESTRICTIONS: No  FALLS:  Has patient fallen in last 6 months? No  LIVING ENVIRONMENT: Lives with: lives alone  OCCUPATION: sitting job  PLOF: Independent  PATIENT GOALS: improve pain management, improve flexibility  PERTINENT HISTORY:  Chronic constipation, endometriosis    BOWEL MOVEMENT: Pain with bowel movement: Yes, 3/10 entire of the movement in the abdomen and using the bathroom actively; patient has to wiggle on the toilet to get the stool out.  Type of bowel movement:Type (Bristol Stool Scale) type 1-7, Frequency 1-2 time per week, Strain Yes, and Splinting no , some days has severe diarrhea  Fully empty rectum: No Leakage: No Fiber supplement: No  URINATION: Pain with urination: No Fully empty bladder: Yes: can take awhile, may sit for awhile and more urine will come out Stream:  average Urgency: No Frequency: average Leakage:  none  INTERCOURSE: not active right now    OBJECTIVE:    DIAGNOSTIC FINDINGS:  none  PATIENT SURVEYS:  CRAIQ-7 67 10/26/23 CRAIQ-7 29  COGNITION: Overall cognitive status: Within functional limits for tasks assessed     SENSATION: Light touch: Appears intact Proprioception: Appears intact   LUMBAR SPECIAL TESTS:  Straight leg raise test: Negative, Slump test: Positive, SI Compression/distraction test: Positive, and Trendelenburg sign: Negative Posterior glide of left femur in supine has pain relief 10/26/23 positive slump test; negative for compression and distraction test for SI; ASIS are equal  POSTURE: No Significant postural limitations  PELVIC ALIGNMENT: Correct alignment  LUMBARAROM/PROM:  A/PROM A/PROM  eval 10/26/23  Flexion full full  Extension Decreased by 25% with pain on left Decreased by 25%  Right lateral flexion Full with pain in the left lumbar full  Left lateral flexion Decreased by 25% Decreased by 25%  Right rotation Decreased by 25% Decreased by 25%  Left rotation full full   (Blank rows = not tested)  LOWER EXTREMITY ROM: Bilateral hip ROM is full   LOWER EXTREMITY MMT:  MMT Right eval Left eval Right 08/24/23 Left  08/24/23 Right 10/26/23 Left  10/26/23  Hip extension 4/5 3/5 4/5 4/5 4/5 4/5  Hip abduction 4/5 3/5 3/5 3/5 3+/5 3+/5  Hip adduction  3/5 3+/5 3+/5 3+/5 3+/5   PALPATION:   General  Patient has difficulty with full expansion of the lower rib cage, tenderness located throughout the abdomen. Tenderness located in the lumbar,  gluteal,  piriformis,  levator ani                External Perineal Exam tenderness located in the puborectalis, and levator ani                             Internal Pelvic Floor tender in bilateral levator ani and obturator internist. Patient is not able to push the therapist finger out of the anal canal. She does not tighten the rectum just not able to generate the force to push it out and the pelvic floor muscles do not lengthen.  Patient confirms  identification and approves PT to assess internal pelvic floor and treatment Yes  PELVIC MMT:   MMT eval 08/24/23  Internal Anal Sphincter 2/5 2/5  External Anal Sphincter 2/5 2/5  Puborectalis 2/5 2/5  (Blank rows = not tested)        TONE: Increased    TODAY'S TREATMENT:   10/26/23 Exercises: Stretches/mobility: Sciatica nerve floss in sitting Strengthening: Bridge with red band around knees and holding green band at shoulder height 15 x Dead bug with red band around the knees and green band she holds in hands 20 x     09/02/23 Manual: Internal pelvic floor techniques: No emotional/communication barriers or cognitive limitation. Patient is motivated to learn. Patient understands and agrees with treatment goals and plan. PT explains patient will be examined in standing, sitting, and lying down to see how their muscles and joints work. When they are ready, they will be asked to remove their underwear so PT can examine their perineum. The patient is also given the option of providing their own chaperone as one is not provided in our facility. The patient also has the right and is explained the right to defer or refuse any part of the evaluation or treatment including the internal exam. With the patient's consent, PT will use one gloved finger to gently assess the muscles of the pelvic floor, seeing how well it contracts and relaxes and if there is muscle symmetry. After, the patient will get dressed and PT and patient will discuss exam findings and plan of care. PT and patient discuss plan of care, schedule, attendance policy and HEP activities. Going through the anus working on the puborectalis, levator ani, and mobilize the cervix posteriorly due to feeling it an dit had reduced mobility Dry needling: Neuromuscular re-education: Pelvic floor contraction training: Therapist finger in the rectum working on breathing to expel therapist finger in  sidely Exercises: Stretches/mobility: Quadruped rock back and forth with hips internally rotated. Cat cow 20 x Supine bringing knees side to side to stretch for rotation  08/24/23 Manual: Internal pelvic floor techniques: No emotional/communication barriers or cognitive limitation. Patient is motivated to learn. Patient understands and agrees with treatment goals and plan. PT explains patient will be examined in standing, sitting, and lying down to see how their muscles and joints work. When they are ready, they will be asked to remove their underwear so PT can examine their perineum. The patient is also given the option of providing their own chaperone as one is not provided in our facility. The patient also has the right and is explained the right to defer or refuse any part of the evaluation or treatment including the internal exam. With the patient's consent, PT will use one gloved finger to gently assess the muscles of the pelvic floor, seeing how well it contracts and relaxes and if there is muscle symmetry. After, the patient will get dressed and PT and patient will discuss exam findings and plan of care. PT and patient discuss plan of care, schedule, attendance policy and HEP activities.  Going through rectum working on the puborectalis, levator ani, anococcygeal ligament to lengthen the pelvic floor  Neuromuscular re-education: Pelvic floor contraction training: Therapist finger in the rectum working on expanding the pelvic floor and pushing the therapist out of the rectum with diaphragmatic breathing.  Exercises: Stretches/mobility: Neural tension stretch for LE 5 x each leg Quadruped rock back and forth with hips internally rotated.  Quadruped with knee on block and rocking  side to side to mobilize the SI joint.                             PATIENT EDUCATION: 10/26/23 Education details: Access Code: [email or web address] Person educated: Patient Education method: Explanation, Demonstration,  Tactile cues, Verbal cues, and Handouts Education comprehension: verbalized understanding, returned demonstration, verbal cues required, tactile cues required, and needs further education   HOME EXERCISE PROGRAM: 10/26/23 Access Code: [email or web address] URL: [email or web address] Date: 10/26/2023 Prepared by: Eulis Foster  Exercises - Child's Pose Stretch  - 1 x daily - 7 x weekly - 1 sets - 10 reps - Quadruped Rocking Slow  - 1 x daily - 7 x weekly - 1 sets - 10 reps - Supine Hip Internal and External Rotation  - 1 x daily - 3 x weekly - 1 sets - 10 reps - Supine March  - 1 x daily - 7 x weekly - 1 sets - 10 reps - Cat Cow  - 1 x daily - 7 x weekly - 3 sets - 10 reps - Seated Sciatic Tensioner  - 1 x daily - 3 x weekly - 1 sets - 10 reps - Dead Bug  - 1 x daily - 3 x weekly - 2 sets - 10 reps - Bridge with Resistance  - 1 x daily - 3 x weekly - 3 sets - 10 reps - Clam with Resistance  - 1 x daily - 3 x weekly - 1 sets - 10 reps  ASSESSMENT:  CLINICAL IMPRESSION: Patient is a 37  y.o. female who was seen today for physical therapy  treatment for sciatica and constipation.  Patient is having less pain overall. She had increased tightness in the puborectalis.  She does have some difficulty with pushing the therapist finger out of the anus and was able to do it 2 times out of 10. She has increased tension in the pelvic floor but able to relax after the manual work.  Pelvic floor strength is 2/5 and not able to hold more than 2-3 seconds.  Patient has weakness in the hips and core. Patient CRAIQ-7 score improved from 67 to 29. Patient has negative SI compression test now and ASIS are equal. Her pain with sitting for 45 minutes averaging 5/10 compared to 7/10. Patient will benefit from skilled therapy to improve pelvic floor lengthen to reduce trigger points and improve constipation while reducing left leg pain.   OBJECTIVE IMPAIRMENTS: decreased coordination, decreased endurance, decreased  ROM, decreased strength, increased fascial restrictions, increased muscle spasms, and pain.   ACTIVITY LIMITATIONS: sitting and toileting  PARTICIPATION LIMITATIONS: occupation  PERSONAL FACTORS: Time since onset of injury/illness/exacerbation and 1-2 comorbidities: Chronic constipation, endimetriosis  are also affecting patient's functional outcome.   REHAB POTENTIAL: Good  CLINICAL DECISION MAKING: Evolving/moderate complexity  EVALUATION COMPLEXITY: Moderate   GOALS: Goals reviewed with patient? Yes  SHORT TERM GOALS: Target date: 05/04/23  Patient independent with initial HEP for pelvic floor lengthening and stretching program.  Baseline:Not educated yet Goal status: Met 06/29/23   LONG TERM GOALS: Target date: 12/30/23  Patient independent with advanced HEP for pelvic floor, back and leg exercises.  Baseline: Not educated Goal status: IN PROGRESS, 10/26/23  2.  Patient able to sit for 45 minutes with pain level not increasing >/= 3/10 due to reduction of trigger points.  Baseline: pain level 3-5/10 Goal status: IN PROGRESS, 10/26/23  3.  Patient is able to push the therapist  finger out of the rectum 3 out 5 times due to the ability to lengthen the pelvic floor muscles and generate the pressure to expel the therapist finger.  Baseline: not able to expel the therapist finger.  Goal status: IN PROGRESS 10/26/23  4.  CRAIQ-7 score is </= 15 due to reduction of pain and patient frustration has decreased.  Baseline: CRAIQ-7 29 Goal status: IN PROGRESS 10/26/23  5.  Patient has increased in lumbar ROM due to improve muscle imbalances and improved mobility of the lumbar facets to expel the stool.  Baseline: lumbar ROM decreased by 25% Goal status: IN PROGRESS 10/26/23   PLAN:  PT FREQUENCY: 1x/week  PT DURATION: 10 weeks  PLANNED INTERVENTIONS: Therapeutic exercises, Therapeutic activity, Neuromuscular re-education, Balance training, Patient/Family education, Joint  mobilization, Dry Needling, Spinal mobilization, Cryotherapy, Moist heat, Taping, Ultrasound, Biofeedback, and Manual therapy  PLAN FOR NEXT SESSION:  manual therapy to pelvic floor, work on toiletting, hip strength. Work on [employer] joint mobilization  Eulis Foster, [place] 10/26/23 10:53 AM

## 2023-11-23 ENCOUNTER — Encounter: Payer: Self-pay | Admitting: Physical Therapy

## 2023-11-24 ENCOUNTER — Ambulatory Visit: Payer: BC Managed Care – PPO | Admitting: Obstetrics and Gynecology

## 2023-11-24 ENCOUNTER — Other Ambulatory Visit (HOSPITAL_COMMUNITY)
Admission: RE | Admit: 2023-11-24 | Discharge: 2023-11-24 | Disposition: A | Discharge status: Home / Self Care | Payer: BC Managed Care – PPO | Source: Ambulatory Visit | Attending: Obstetrics and Gynecology | Admitting: Obstetrics and Gynecology

## 2023-11-24 VITALS — BP 112/72 | HR 54 | Wt 169.0 lb

## 2023-11-24 DIAGNOSIS — N898 Other specified noninflammatory disorders of vagina: Secondary | ICD-10-CM

## 2023-11-24 DIAGNOSIS — Z1331 Encounter for screening for depression: Secondary | ICD-10-CM | POA: Diagnosis not present

## 2023-11-24 DIAGNOSIS — N979 Female infertility, unspecified: Secondary | ICD-10-CM

## 2023-11-24 DIAGNOSIS — N809 Endometriosis, unspecified: Secondary | ICD-10-CM | POA: Diagnosis not present

## 2023-11-24 DIAGNOSIS — Z86718 Personal history of other venous thrombosis and embolism: Secondary | ICD-10-CM | POA: Diagnosis not present

## 2023-11-24 NOTE — Progress Notes (Signed)
GYNECOLOGY VISIT  Patient name: Lori Mays MRN [id number]  Date of birth: Nov 10, 1986 Chief Complaint:   Gynecologic Exam   History:  Lori Mays is a 37 y.o. G0P0000 being seen today for purple vaginal discharge. Marland Kitchen    Purple fluid after her mense. Menses lat 5-7 days and typically once a month. Fluid is 3- 7d at a time. Eggplant colored fluid that starts thin and then gets bigger. Last had it last weekekd following her period. No vaginal pain. Yesterday may have had some pain and got better with alevel. Currently sexually active. No pain with intercourse. No discharge beside the purple discharge. Also has colorectal issues. Pases clots with her menses - burgundy. Refers to the fluid/blood as endometrial fluid  Discussed the use of AI scribe software for clinical note transcription with the patient, who gave verbal consent to proceed.  History of Present Illness   The patient, with a history of endometriosis, presents with a year-long concern of abnormal menstrual bleeding. The bleeding is described as 'eggplant purple' in color and varies in consistency, sometimes thin like urine and other times thicker. The abnormal bleeding does not occur with every menstrual cycle and can present before, during, or after the cycle. The patient also reports different cramping patterns, with initial cramps before the onset of bleeding, followed by regular menstrual cramps.  The patient also experiences irregular bowel movements, often constipated, with occasional blood in the stool. She denies any changes in bowel habits during her menstrual cycle. The patient is sexually active and denies any post-coital bleeding or discharge.  The patient has a history of ovarian cysts that occasionally leak, causing a burning sensation. She also mentions an abnormality in the hip joint area, described as a 'hole' or 'space,' where fluid has been detected. This area is associated with a sensation of 'burning'  when the ovarian cyst leaks.  The patient also has a history of deep vein thrombosis (DVT) in the leg, which occurred after discontinuing Depo-Provera. She reports residual swelling and pain in the leg.  The patient has previously been on Oril[place] for endometriosis and has been considering in-vitro fertilization (IVF), but has not pursued it in the past year due to other health concerns.       Past Medical History:  Diagnosis Date   Abnormal pap smears 06/16/2013   Asthma    DVT, lower extremity, proximal (HCC)    Endometriosis    GERD (gastroesophageal reflux disease)     Past Surgical History:  Procedure Laterality Date   laproscopy  2011   LEEP N/A 12/2019    The following portions of the patient's history were reviewed and updated as appropriate: allergies, current medications, past family history, past medical history, past social history, past surgical history and problem list.   Health Maintenance:   Last pap     Component Value Date/Time   DIAGPAP  07/21/2022 1502    - Negative for intraepithelial lesion or malignancy (NILM)   DIAGPAP  04/01/2021 0917    - Negative for intraepithelial lesion or malignancy (NILM)   DIAGPAP - Low grade squamous intraepithelial lesion (LSIL) (A) 10/24/2019 1324   HPVHIGH Negative 07/21/2022 1502   HPVHIGH Negative 04/01/2021 0917   ADEQPAP  07/21/2022 1502    Satisfactory for evaluation; transformation zone component PRESENT.   ADEQPAP  04/01/2021 0917    Satisfactory for evaluation; transformation zone component PRESENT.   ADEQPAP  10/24/2019 1324    Satisfactory for evaluation; transformation zone component PRESENT.  High Risk HPV: Positive  Adequacy:  Satisfactory for evaluation, transformation zone component PRESENT  Diagnosis:  Atypical squamous cells of undetermined significance (ASC-US)  Last mammogram: n/a   Review of Systems:  Pertinent items are noted in HPI. Comprehensive review of systems was otherwise  negative.   Objective:  Physical Exam BP 112/72   Pulse (!) 54   Wt 169 lb (76.7 kg)   LMP 11/14/2023 (Approximate)   BMI 24.96 kg/m    Physical Exam Vitals and nursing note reviewed. Exam conducted with a chaperone present.  Constitutional:      Appearance: Normal appearance.  HENT:     Head: Normocephalic and atraumatic.  Pulmonary:     Effort: Pulmonary effort is normal.     Breath sounds: Normal breath sounds.  Genitourinary:    General: Normal vulva.     Exam position: Lithotomy position.     Vagina: Normal.     Cervix: Normal.     Comments: Dark blood noted on vaginal vault No abnormal lesions noted  Tender uterosacrals bilaterally  Skin:    General: Skin is warm and dry.  Neurological:     General: No focal deficit present.     Mental Status: She is alert.  Psychiatric:        Mood and Affect: Mood normal.        Behavior: Behavior normal.        Thought Content: Thought content normal.        Judgment: Judgment normal.         Assessment & Plan:   Assessment and Plan    Abnormal Uterine Bleeding Reports of purple discharge during or around menstrual cycle for over a year. No associated changes in bowel habits or bleeding after intercourse. Patient has a history of cysts. -no obvious finding on pelvic exam, exam only notable for old blood -Order an MRI to better visualize pelvic structures and assess for any abnormal connections or adhesions.  Deep Vein Thrombosis (DVT) History of DVT with no clear provocation. Patient reports ongoing leg swelling and pain. -Refer to a hematologist for further evaluation and management.  Infertility Expressed desire to become pregnant. Previously seen by a specialist at [facility]. -Consider re-establishing care with an infertility specialist when patient is ready to proceed with attempts to conceive.  Constipation Chronic constipation reported, with no changes during menstrual cycle.  Hip Abnormality Reports of  a "hole" in the hip area, with associated pain when cysts leak. Last imaging was a year or two ago. -should be further characterized on pelvic MRI       Noted abnormal discharge may be related to recurrent BV - can use prn boric acid or douche with plain water.  Use of boric acid or douch efor bv   Routine preventative health maintenance measures emphasized.  Lorriane Shire, MD [facility], [facility]

## 2023-11-25 LAB — CERVICOVAGINAL ANCILLARY ONLY
Bacterial Vaginitis (gardnerella): POSITIVE — AB
Candida Glabrata: NEGATIVE
Candida Vaginitis: NEGATIVE
Chlamydia: NEGATIVE
Comment: NEGATIVE
Comment: NEGATIVE
Comment: NEGATIVE
Comment: NEGATIVE
Comment: NEGATIVE
Comment: NORMAL
Neisseria Gonorrhea: NEGATIVE
Trichomonas: NEGATIVE

## 2023-11-27 ENCOUNTER — Other Ambulatory Visit: Payer: Self-pay | Admitting: Obstetrics and Gynecology

## 2023-11-27 DIAGNOSIS — B9689 Other specified bacterial agents as the cause of diseases classified elsewhere: Secondary | ICD-10-CM

## 2023-11-27 MED ORDER — METRONIDAZOLE 0.75 % VA GEL
1.0000 | Freq: Every day | VAGINAL | 5 refills | Status: DC
Start: 2023-11-27 — End: 2024-09-07

## 2023-11-30 ENCOUNTER — Ambulatory Visit: Payer: BC Managed Care – PPO | Admitting: Physical Therapy

## 2023-12-09 ENCOUNTER — Encounter: Payer: Self-pay | Admitting: Physical Therapy

## 2023-12-09 ENCOUNTER — Ambulatory Visit: Payer: BC Managed Care – PPO | Attending: Family Medicine | Admitting: Physical Therapy

## 2023-12-09 DIAGNOSIS — R102 Pelvic and perineal pain: Secondary | ICD-10-CM | POA: Diagnosis present

## 2023-12-09 DIAGNOSIS — M5442 Lumbago with sciatica, left side: Secondary | ICD-10-CM | POA: Diagnosis present

## 2023-12-09 DIAGNOSIS — M6281 Muscle weakness (generalized): Secondary | ICD-10-CM | POA: Insufficient documentation

## 2023-12-09 DIAGNOSIS — M5459 Other low back pain: Secondary | ICD-10-CM | POA: Insufficient documentation

## 2023-12-09 NOTE — Therapy (Addendum)
OUTPATIENT PHYSICAL THERAPY FEMALE PELVIC TREATMENT  Patient Name: Lori Mays MRN: [id number] DOB:26-Feb-1986, 37 y.o., female Today's Date: 12/09/2023  END OF SESSION:  PT End of Session - 12/09/23 1030     Visit Number 9    Date for PT Re-Evaluation 12/30/23    Authorization Type BCBS and Medicaid    Authorization Time Period 12/07/23-12/27/23    Authorization - Visit Number 1    Authorization - Number of Visits 3    PT Start Time 1030   came late   PT Stop Time 1055    PT Time Calculation (min) 25 min    Activity Tolerance Patient tolerated treatment well    Behavior During Therapy [facility] for tasks assessed/performed             Past Medical History:  Diagnosis Date   Abnormal pap smears 06/16/2013   Asthma    DVT, lower extremity, proximal (HCC)    Endometriosis    GERD (gastroesophageal reflux disease)    Past Surgical History:  Procedure Laterality Date   laproscopy  2011   LEEP N/A 12/2019   Patient Active Problem List   Diagnosis Date Noted   Chronic constipation 05/09/2020   History of DVT (deep vein thrombosis) 05/02/2020   Dysplasia of cervix, 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   Low grade squamous intraepith lesion on cytologic smear cervix (lgsil) 11/09/2019   Asthma, chronic 06/22/2013   Migraine headache 06/22/2013   Arthralgia 06/13/2013   Endometriosis 06/13/2013   Edema of both legs 06/13/2013   Weight gain 06/13/2013    PCP: Claiborne Rigg, NP  REFERRING PROVIDER: Reva Bores, MD   REFERRING DIAG:  K59.09 (ICD-10-CM) - Chronic constipation  M54.32 (ICD-10-CM) - Left sciatic nerve pain    THERAPY DIAG:  Muscle weakness (generalized)  Pelvic pain  Other low back pain  Low back pain with left-sided sciatica, unspecified back pain laterality, unspecified chronicity  Rationale for Evaluation and Treatment: Rehabilitation  ONSET DATE: 8/23  SUBJECTIVE:                                                                                                                                                                                            SUBJECTIVE STATEMENT: I have one bowel movement per day.     PAIN:  Are you having pain? Yes NPRS scale: 4/10 Pain location:  left lumbar to the left calf  Pain type: throbbing and sore muscles Pain description: intermittent   Aggravating factors: sitting on commode, sitting at work on a cushion Relieving factors: not sitting on left buttocks, reduce pressure on the left leg  PAIN:  Are you having pain? Yes: NPRS scale: 3/10 Pain location: right lower abdominal  Pain description: sharp, shooting, throbbing Aggravating factors: sitting, back up in the colon Relieving factors: heat, pain medication   PRECAUTIONS: None  WEIGHT BEARING RESTRICTIONS: No  FALLS:  Has patient fallen in last 6 months? No  LIVING ENVIRONMENT: Lives with: lives alone  OCCUPATION: sitting job  PLOF: Independent  PATIENT GOALS: improve pain management, improve flexibility  PERTINENT HISTORY:  Chronic constipation, endometriosis    BOWEL MOVEMENT: Pain with bowel movement: Yes, 3/10 entire of the movement in the abdomen and using the bathroom actively; patient has to wiggle on the toilet to get the stool out.  Type of bowel movement:Type (Bristol Stool Scale) type 1-7, Frequency 1-2 time per week, Strain Yes, and Splinting no , some days has severe diarrhea  Fully empty rectum: No Leakage: No Fiber supplement: No  URINATION: Pain with urination: No Fully empty bladder: Yes: can take awhile, may sit for awhile and more urine will come out Stream:  average Urgency: No Frequency: average Leakage:  none  INTERCOURSE: not active right now    OBJECTIVE:   DIAGNOSTIC FINDINGS:  none  PATIENT SURVEYS:  CRAIQ-7 67 10/26/23 CRAIQ-7 29  COGNITION: Overall cognitive status: Within functional limits for tasks assessed     SENSATION: Light touch: Appears  intact Proprioception: Appears intact   LUMBAR SPECIAL TESTS:  Straight leg raise test: Negative, Slump test: Positive, SI Compression/distraction test: Positive, and Trendelenburg sign: Negative Posterior glide of left femur in supine has pain relief 10/26/23 positive slump test; negative for compression and distraction test for SI; ASIS are equal  POSTURE: No Significant postural limitations  PELVIC ALIGNMENT: Correct alignment  LUMBARAROM/PROM:  A/PROM A/PROM  eval 10/26/23  Flexion full full  Extension Decreased by 25% with pain on left Decreased by 25%  Right lateral flexion Full with pain in the left lumbar full  Left lateral flexion Decreased by 25% Decreased by 25%  Right rotation Decreased by 25% Decreased by 25%  Left rotation full full   (Blank rows = not tested)  LOWER EXTREMITY ROM: Bilateral hip ROM is full   LOWER EXTREMITY MMT:  MMT Right eval Left eval Right 08/24/23 Left  08/24/23 Right 10/26/23 Left  10/26/23  Hip extension 4/5 3/5 4/5 4/5 4/5 4/5  Hip abduction 4/5 3/5 3/5 3/5 3+/5 3+/5  Hip adduction  3/5 3+/5 3+/5 3+/5 3+/5   PALPATION:   General  Patient has difficulty with full expansion of the lower rib cage, tenderness located throughout the abdomen. Tenderness located in the lumbar,  gluteal,  piriformis,  levator ani                External Perineal Exam tenderness located in the puborectalis, and levator ani                             Internal Pelvic Floor tender in bilateral levator ani and obturator internist. Patient is not able to push the therapist finger out of the anal canal. She does not tighten the rectum just not able to generate the force to push it out and the pelvic floor muscles do not lengthen.   Patient confirms identification and approves PT to assess internal pelvic floor and treatment Yes  PELVIC MMT:   MMT eval 08/24/23  Internal Anal Sphincter 2/5 2/5  External Anal Sphincter 2/5 2/5  Puborectalis 2/5 2/5  (Blank  rows = not tested)        TONE: Increased    TODAY'S TREATMENT:   12/09/23 Manual: Internal pelvic floor techniques: No emotional/communication barriers or cognitive limitation. Patient is motivated to learn. Patient understands and agrees with treatment goals and plan. PT explains patient will be examined in standing, sitting, and lying down to see how their muscles and joints work. When they are ready, they will be asked to remove their underwear so PT can examine their perineum. The patient is also given the option of providing their own chaperone as one is not provided in our facility. The patient also has the right and is explained the right to defer or refuse any part of the evaluation or treatment including the internal exam. With the patient's consent, PT will use one gloved finger to gently assess the muscles of the pelvic floor, seeing how well it contracts and relaxes and if there is muscle symmetry. After, the patient will get dressed and PT and patient will discuss exam findings and plan of care. PT and patient discuss plan of care, schedule, attendance policy and HEP activities.  Going through the rectum working lengthening the puborectalis, iliococcygeus, and anterior anal canal Neuromuscular re-education: Down training: Breathing into the abdomen to relax the pelvic floor then breath out with pressure to push the therapist finger out. Took  4 times and many verbal cues to expand the anus and keep it open with breathing out.  Exercises: Stretches/mobility: Knee to chest holding 30 sec bil.  Supine butterfly stretch  hold 30 sec Piriformis stretch in supine holding 30 sec bil.  Marjo Bicker pose holding for 30 sec Educated patient on using coconut oil in the rectum to make it easier for the stool to slide out and massage the rectum to make the sphincter muscles relax    10/26/23 Exercises: Stretches/mobility: Sciatica nerve floss in sitting Strengthening: Bridge with red band  around knees and holding green band at shoulder height 15 x Dead bug with red band around the knees and green band she holds in hands 20 x     09/02/23 Manual: Internal pelvic floor techniques: No emotional/communication barriers or cognitive limitation. Patient is motivated to learn. Patient understands and agrees with treatment goals and plan. PT explains patient will be examined in standing, sitting, and lying down to see how their muscles and joints work. When they are ready, they will be asked to remove their underwear so PT can examine their perineum. The patient is also given the option of providing their own chaperone as one is not provided in our facility. The patient also has the right and is explained the right to defer or refuse any part of the evaluation or treatment including the internal exam. With the patient's consent, PT will use one gloved finger to gently assess the muscles of the pelvic floor, seeing how well it contracts and relaxes and if there is muscle symmetry. After, the patient will get dressed and PT and patient will discuss exam findings and plan of care. PT and patient discuss plan of care, schedule, attendance policy and HEP activities. Going through the anus working on the puborectalis, levator ani, and mobilize the cervix posteriorly due to feeling it an dit had reduced mobility Neuromuscular re-education: Pelvic floor contraction training: Therapist finger in the rectum working on breathing to expel therapist finger in sidely Exercises: Stretches/mobility: Quadruped rock back and forth with hips internally rotated. Cat cow 20 x Supine bringing knees side to side to  stretch for rotation                          PATIENT EDUCATION: 10/26/23 Education details: Access Code: [email or web address] Person educated: Patient Education method: Explanation, Demonstration, Tactile cues, Verbal cues, and Handouts Education comprehension: verbalized understanding, returned  demonstration, verbal cues required, tactile cues required, and needs further education   HOME EXERCISE PROGRAM: 10/26/23 Access Code: [email or web address] URL: [email or web address] Date: 10/26/2023 Prepared by: Eulis Foster  Exercises - Child's Pose Stretch  - 1 x daily - 7 x weekly - 1 sets - 10 reps - Quadruped Rocking Slow  - 1 x daily - 7 x weekly - 1 sets - 10 reps - Supine Hip Internal and External Rotation  - 1 x daily - 3 x weekly - 1 sets - 10 reps - Supine March  - 1 x daily - 7 x weekly - 1 sets - 10 reps - Cat Cow  - 1 x daily - 7 x weekly - 3 sets - 10 reps - Seated Sciatic Tensioner  - 1 x daily - 3 x weekly - 1 sets - 10 reps - Dead Bug  - 1 x daily - 3 x weekly - 2 sets - 10 reps - Bridge with Resistance  - 1 x daily - 3 x weekly - 3 sets - 10 reps - Clam with Resistance  - 1 x daily - 3 x weekly - 1 sets - 10 reps  ASSESSMENT:  CLINICAL IMPRESSION: Patient is a 37  y.o. female who was seen today for physical therapy  treatment for sciatica and constipation. Patient had less pain after the manual work. She has trouble generating pressure to push the therapist finger out of the rectum. She has difficulty with diaphragmatic breathing to relax the pelvic floor. She has difficulty bringing her knee to chest due to the pain from the stool being in the intestines. Patient will benefit from skilled therapy to improve pelvic floor lengthen to reduce trigger points and improve constipation while reducing left leg pain.   OBJECTIVE IMPAIRMENTS: decreased coordination, decreased endurance, decreased ROM, decreased strength, increased fascial restrictions, increased muscle spasms, and pain.   ACTIVITY LIMITATIONS: sitting and toileting  PARTICIPATION LIMITATIONS: occupation  PERSONAL FACTORS: Time since onset of injury/illness/exacerbation and 1-2 comorbidities: Chronic constipation, endimetriosis  are also affecting patient's functional outcome.   REHAB POTENTIAL:  Good  CLINICAL DECISION MAKING: Evolving/moderate complexity  EVALUATION COMPLEXITY: Moderate   GOALS: Goals reviewed with patient? Yes  SHORT TERM GOALS: Target date: 05/04/23  Patient independent with initial HEP for pelvic floor lengthening and stretching program.  Baseline:Not educated yet Goal status: Met 06/29/23   LONG TERM GOALS: Target date: 12/30/23  Patient independent with advanced HEP for pelvic floor, back and leg exercises.  Baseline: Not educated Goal status: IN PROGRESS, 10/26/23  2.  Patient able to sit for 45 minutes with pain level not increasing >/= 3/10 due to reduction of trigger points.  Baseline: pain level 3-5/10 Goal status: IN PROGRESS, 10/26/23  3.  Patient is able to push the therapist finger out of the rectum 3 out 5 times due to the ability to lengthen the pelvic floor muscles and generate the pressure to expel the therapist finger.  Baseline: not able to expel the therapist finger.  Goal status: IN PROGRESS 10/26/23  4.  CRAIQ-7 score is </= 15 due to reduction of pain and patient frustration has decreased.  Baseline: CRAIQ-7 29 Goal status:  IN PROGRESS 10/26/23  5.  Patient has increased in lumbar ROM due to improve muscle imbalances and improved mobility of the lumbar facets to expel the stool.  Baseline: lumbar ROM decreased by 25% Goal status: IN PROGRESS 10/26/23   PLAN:  PT FREQUENCY: 1x/week  PT DURATION: 10 weeks  PLANNED INTERVENTIONS: Therapeutic exercises, Therapeutic activity, Neuromuscular re-education, Balance training, Patient/Family education, Joint mobilization, Dry Needling, Spinal mobilization, Cryotherapy, Moist heat, Taping, Ultrasound, Biofeedback, and Manual therapy  PLAN FOR NEXT SESSION:  manual therapy to pelvic floor, work on toiletting, hip strength. Work on [employer] joint mobilization  Eulis Foster, PT 12/09/23 11:40 AM   PHYSICAL THERAPY DISCHARGE SUMMARY  Visits from Start of Care: 9  Current functional level  related to goals / functional outcomes: See above.      Remaining deficits: See above. Patient has not returned since her last visit on 12/09/23 and has not been assessed for discharge.    Education / Equipment: HEP   Patient agrees to discharge. Patient goals were not met. Patient is being discharged due to not returning since the last visit. Thank you for the referral.   Eulis Foster, PT 01/25/24 2:12 PM

## 2023-12-22 ENCOUNTER — Ambulatory Visit: Payer: BC Managed Care – PPO | Admitting: Physical Therapy

## 2024-01-05 ENCOUNTER — Inpatient Hospital Stay: Payer: Managed Care, Other (non HMO) | Attending: Hematology

## 2024-01-05 ENCOUNTER — Inpatient Hospital Stay: Payer: Managed Care, Other (non HMO) | Admitting: Hematology

## 2024-01-05 DIAGNOSIS — N809 Endometriosis, unspecified: Secondary | ICD-10-CM | POA: Insufficient documentation

## 2024-01-05 DIAGNOSIS — K219 Gastro-esophageal reflux disease without esophagitis: Secondary | ICD-10-CM | POA: Insufficient documentation

## 2024-01-05 DIAGNOSIS — Z803 Family history of malignant neoplasm of breast: Secondary | ICD-10-CM | POA: Insufficient documentation

## 2024-01-05 DIAGNOSIS — Z86718 Personal history of other venous thrombosis and embolism: Secondary | ICD-10-CM | POA: Insufficient documentation

## 2024-01-05 DIAGNOSIS — Z808 Family history of malignant neoplasm of other organs or systems: Secondary | ICD-10-CM | POA: Insufficient documentation

## 2024-01-05 DIAGNOSIS — J45909 Unspecified asthma, uncomplicated: Secondary | ICD-10-CM | POA: Insufficient documentation

## 2024-01-08 ENCOUNTER — Other Ambulatory Visit: Payer: Self-pay

## 2024-01-14 ENCOUNTER — Telehealth: Payer: Self-pay | Admitting: Hematology

## 2024-01-14 NOTE — Telephone Encounter (Addendum)
Per Dr Latanya Maudlin request, Pt's appointment moved from 340 to 1000 am on 01/15/24 Pt informed about appointment change.

## 2024-01-15 ENCOUNTER — Inpatient Hospital Stay: Payer: Managed Care, Other (non HMO)

## 2024-01-15 ENCOUNTER — Encounter: Payer: Self-pay | Admitting: Hematology

## 2024-01-15 ENCOUNTER — Encounter: Payer: BC Managed Care – PPO | Admitting: Hematology

## 2024-01-15 ENCOUNTER — Inpatient Hospital Stay (HOSPITAL_BASED_OUTPATIENT_CLINIC_OR_DEPARTMENT_OTHER): Payer: Managed Care, Other (non HMO) | Admitting: Hematology

## 2024-01-15 VITALS — BP 129/77 | HR 67 | Temp 98.6°F | Resp 16 | Ht 69.0 in | Wt 169.4 lb

## 2024-01-15 DIAGNOSIS — K219 Gastro-esophageal reflux disease without esophagitis: Secondary | ICD-10-CM | POA: Diagnosis not present

## 2024-01-15 DIAGNOSIS — N809 Endometriosis, unspecified: Secondary | ICD-10-CM | POA: Diagnosis not present

## 2024-01-15 DIAGNOSIS — Z86718 Personal history of other venous thrombosis and embolism: Secondary | ICD-10-CM

## 2024-01-15 DIAGNOSIS — J45909 Unspecified asthma, uncomplicated: Secondary | ICD-10-CM

## 2024-01-15 DIAGNOSIS — Z803 Family history of malignant neoplasm of breast: Secondary | ICD-10-CM | POA: Diagnosis not present

## 2024-01-15 DIAGNOSIS — Z808 Family history of malignant neoplasm of other organs or systems: Secondary | ICD-10-CM | POA: Diagnosis not present

## 2024-01-15 LAB — ANTITHROMBIN III: AntiThromb III Func: 111 % (ref 75–120)

## 2024-01-15 NOTE — Progress Notes (Unsigned)
[facility]   Telephone:(336) [phone] Fax:([phone]   Clinic New Consult Note   No care team member to display 01/15/2024  CHIEF COMPLAINTS/PURPOSE OF CONSULTATION:  History of left lower extremity DVT  REFERRING PHYSICIAN: Lorriane Shire, MD   Discussed the use of AI scribe software for clinical note transcription with the patient, who gave verbal consent to proceed.  History of Present Illness   The patient, a 38 year old female with a history of DVT in the left lower extremity, presents with ongoing discomfort and swelling in the same leg. The DVT episode occurred in January 2020, seemingly out of the blue. The patient recalls experiencing sudden foot pain after returning home from church, which escalated to severe pain shooting up her foot when she tried to walk. Despite resting and elevating her foot, the pain persisted, and the following morning, her foot appeared blue. Subsequent medical evaluation confirmed the presence of an acute DVT in the left peroneal vein.  The patient was treated with Xarelto for approximately three months, during which the pain resolved. However, she reports that the pain in her leg returned at some point after the treatment, prompting her to undergo two additional ultrasounds in July and August of the same year. Both tests were negative for DVT, but the patient was experiencing severe pain and significant swelling in her leg at the time. The patient describes the swelling as being present most of the time, worsening by the end of the day, especially after prolonged sitting due to her work. She also experiences sciatic-like pain when walking.  The patient has been using compression socks, mainly during travel, and occasionally at work when she experiences discomfort. She also elevates her leg at work using a stool. She has not had any injuries to her left leg, but she does have a history of endometriosis and asthma. She does not smoke, does  not drink alcohol, and has no family history of clotting disorders or malignancies.         MEDICAL HISTORY:  Past Medical History:  Diagnosis Date   Abnormal pap smears 06/16/2013   Asthma    DVT, lower extremity, proximal (HCC)    Endometriosis    GERD (gastroesophageal reflux disease)     SURGICAL HISTORY: Past Surgical History:  Procedure Laterality Date   laproscopy  2011   LEEP N/A 12/2019    SOCIAL HISTORY: Social History   Socioeconomic History   Marital status: Single    Spouse name: Not on file   Number of children: 0   Years of education: Not on file   Highest education level: Some college, no degree  Occupational History   Not on file  Tobacco Use   Smoking status: Never   Smokeless tobacco: Never  Vaping Use   Vaping status: Never Used  Substance and Sexual Activity   Alcohol use: Not Currently    Alcohol/week: 1.0 - 2.0 standard drink of alcohol    Types: 1 - 2 Glasses of wine per week    Comment: occassionally   Drug use: No   Sexual activity: Yes    Partners: Male    Birth control/protection: Condom  Other Topics Concern   Not on file  Social History Narrative   Not on file   Social Drivers of Health   Financial Resource Strain: Not on file  Food Insecurity: Food Insecurity Present (11/24/2023)   Hunger Vital Sign    Worried About Running Out of Food in the Last Year:  Sometimes true    Ran Out of Food in the Last Year: Sometimes true  Transportation Needs: Unmet Transportation Needs (11/24/2023)   PRAPARE - Administrator, Civil Service (Medical): Yes    Lack of Transportation (Non-Medical): Yes  Physical Activity: Not on file  Stress: Not on file  Social Connections: Not on file  Intimate Partner Violence: Not on file    FAMILY HISTORY: Family History  Problem Relation Age of Onset   Breast cancer Paternal Aunt    Breast cancer Maternal Grandmother    Brain cancer Paternal Grandmother    Breast cancer Paternal  Grandmother    Colon cancer Neg Hx    Colon polyps Neg Hx    Esophageal cancer Neg Hx    Pancreatic cancer Neg Hx    Stomach cancer Neg Hx     ALLERGIES:  is allergic to diphenhydramine and latex.  MEDICATIONS:  Current Outpatient Medications  Medication Sig Dispense Refill   acetaminophen (TYLENOL) 500 MG tablet Take 2 tablets (1,000 mg total) by mouth every 6 (six) hours as needed. 120 tablet 1   famotidine (PEPCID) 20 MG tablet Take 20 mg by mouth daily.     ibuprofen (ADVIL) 800 MG tablet TAKE 1 TABLET BY MOUTH EVERY 8 HOURS AS NEEDED 45 tablet 1   linaclotide (LINZESS) 290 MCG CAPS capsule Take 1 capsule (290 mcg total) by mouth daily before breakfast. 30 capsule 12   Loratadine (CLARITIN PO) Take by mouth.     Magnesium Hydroxide (DULCOLAX PO) Take by mouth as needed.     metroNIDAZOLE (METROGEL) 0.75 % vaginal gel Place 1 Applicatorful vaginally at bedtime. Apply one applicatorful to vagina at bedtime for 10 days, then twice a week for 6 months. 70 g 5   polyethylene glycol (MIRALAX / GLYCOLAX) 17 g packet Take 17 g by mouth daily.     PROAIR HFA 108 (90 Base) MCG/ACT inhaler INHALE 1-2 PUFFS INTO THE LUNGS EVERY 6 (SIX) HOURS AS NEEDED FOR WHEEZING OR SHORTNESS OF BREATH. 8.5 each 1   VITAMIN D3 1.25 MG (50000 UT) capsule Take 50,000 Units by mouth once a week.     No current facility-administered medications for this visit.    REVIEW OF SYSTEMS:   Constitutional: Denies fevers, chills or abnormal night sweats Eyes: Denies blurriness of vision, double vision or watery eyes Ears, nose, mouth, throat, and face: Denies mucositis or sore throat Respiratory: Denies cough, dyspnea or wheezes Cardiovascular: Denies palpitation, chest discomfort or lower extremity swelling Gastrointestinal:  Denies nausea, heartburn or change in bowel habits Skin: Denies abnormal skin rashes Lymphatics: Denies new lymphadenopathy or easy bruising Neurological:Denies numbness, tingling or new  weaknesses Behavioral/Psych: Mood is stable, no new changes  All other systems were reviewed with the patient and are negative.  PHYSICAL EXAMINATION: ECOG PERFORMANCE STATUS: 0 - Asymptomatic  Vitals:   01/15/24 1039  BP: 129/77  Pulse: 67  Resp: 16  Temp: 98.6 F (37 C)  SpO2: 100%   Filed Weights   01/15/24 1039  Weight: 169 lb 6.4 oz (76.8 kg)    GENERAL:alert, no distress and comfortable SKIN: skin color, texture, turgor are normal, no rashes or significant lesions EYES: normal, conjunctiva are pink and non-injected, sclera clear OROPHARYNX:no exudate, no erythema and lips, buccal mucosa, and tongue normal  NECK: supple, thyroid normal size, non-tender, without nodularity LYMPH:  no palpable lymphadenopathy in the cervical, axillary or inguinal LUNGS: clear to auscultation and percussion with normal breathing effort HEART:  regular rate & rhythm and no murmurs  ABDOMEN:abdomen soft, non-tender and normal bowel sounds Musculoskeletal:no cyanosis of digits and no clubbing, mild left ankle edema, no calf tenderness PSYCH: alert & oriented x 3 with fluent speech NEURO: no focal motor/sensory deficits  Physical Exam   EXTREMITIES: Left lower extremity exhibits swelling, presence of indents consistent with sock marks, intermittent swelling throughout the day, and mild puffiness.      LABORATORY DATA:  I have reviewed the data as listed    Latest Ref Rng & Units 07/30/2023    2:59 PM 04/21/2020    5:37 AM 09/21/2019    8:37 PM  CBC  WBC 4.0 - 10.5 K/uL 5.2  5.5  4.6   Hemoglobin 12.0 - 15.0 g/dL 16.1  09.6  04.5   Hematocrit 36.0 - 46.0 % 37.2  36.0  36.9   Platelets 150.0 - 400.0 K/uL 252.0  298  252      RADIOGRAPHIC STUDIES: I have personally reviewed the radiological images as listed and agreed with the findings in the report. No results found.  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 Discussed compression socks, leg elevation, and genetic testing for thrombophilia to assess underlying clotting disorders. Emphasized lifestyle modifications to reduce future clot risk. - Recommend frequent use of compression socks, especially during prolonged sitting. - Advise leg elevation to reduce swelling. - Order genetic testing for thrombophilia. -I recommend her to avoid oral contraceptive, especially estrogen -Also discussed the risk of thrombosis from smoking and obesity, she will watch her weight. - Schedule follow-up phone visit in three weeks to discuss genetic test results. -If she has recurrent thrombosis in the feet, she would likely need lifelong anticoagulation.  Asthma Well-controlled. No recent exacerbations reported.  Endometriosis No current symptoms or treatment discussed.  Gastroesophageal Reflux Disease (GERD) No current symptoms or treatment discussed.  General Health Maintenance Discussed maintaining a healthy lifestyle to prevent future health issues, including blood clots. Advised on risks of smoking, benefits of regular exercise, and a healthy diet. - Advise regular exercise to improve venous circulation and overall health. - Recommend a healthy diet to maintain a healthy weight. - Advise avoiding smoking and limiting alcohol consumption.  Plan -Lab today for hypercoagulopathy workup - Schedule follow-up phone visit in three weeks to discuss lab test results.         Orders Placed This Encounter  Procedures   Antithrombin III    Standing Status:   Future    Number of Occurrences:   1    Expiration Date:   01/14/2025   Protein C activity    Standing Status:   Future    Number of Occurrences:   1    Expiration Date:   01/14/2025   Protein C, total     Standing Status:   Future    Number of Occurrences:   1    Expiration Date:   01/14/2025   Protein S activity    Standing Status:   Future    Number of Occurrences:   1    Expiration Date:   01/14/2025   Protein S, total    Standing Status:   Future    Number of Occurrences:   1    Expiration Date:  01/14/2025   Lupus anticoagulant panel    Standing Status:   Future    Number of Occurrences:   1    Expiration Date:   01/14/2025   Beta-2-glycoprotein i abs, IgG/M/A    Standing Status:   Future    Number of Occurrences:   1    Expiration Date:   01/14/2025   Homocysteine, serum    Standing Status:   Future    Number of Occurrences:   1    Expiration Date:   01/14/2025   Factor 5 leiden    Standing Status:   Future    Number of Occurrences:   1    Expiration Date:   01/14/2025   Prothrombin gene mutation    Standing Status:   Future    Number of Occurrences:   1    Expiration Date:   01/14/2025   Cardiolipin antibodies, IgG, IgM, IgA    Standing Status:   Future    Number of Occurrences:   1    Expiration Date:   01/14/2025    All questions were answered. The patient knows to call the clinic with any problems, questions or concerns. I spent 25 minutes counseling the patient face to face. The total time spent in the appointment was 30 minutes and more than 50% was on counseling.     Malachy Mood, MD 01/15/2024

## 2024-01-16 LAB — PROTEIN C ACTIVITY: Protein C Activity: 71 % — ABNORMAL LOW (ref 73–180)

## 2024-01-16 LAB — LUPUS ANTICOAGULANT PANEL
DRVVT: 34 s (ref 0.0–47.0)
PTT Lupus Anticoagulant: 41 s (ref 0.0–43.5)

## 2024-01-16 LAB — HOMOCYSTEINE: Homocysteine: 10.3 umol/L (ref 0.0–14.5)

## 2024-01-16 LAB — PROTEIN S, TOTAL: Protein S Ag, Total: 52 % — ABNORMAL LOW (ref 60–150)

## 2024-01-16 LAB — PROTEIN S ACTIVITY: Protein S Activity: 42 % — ABNORMAL LOW (ref 63–140)

## 2024-01-17 LAB — BETA-2-GLYCOPROTEIN I ABS, IGG/M/A
Beta-2 Glyco I IgG: <9 GPI IgG units (ref 0–20)
Beta-2-Glycoprotein I IgA: <9 GPI IgA units (ref 0–25)
Beta-2-Glycoprotein I IgM: <9 GPI IgM units (ref 0–32)

## 2024-01-17 LAB — CARDIOLIPIN ANTIBODIES, IGG, IGM, IGA
Anticardiolipin IgA: <9 [APL'U]/mL (ref 0–11)
Anticardiolipin IgG: <9 [GPL'U]/mL (ref 0–14)
Anticardiolipin IgM: 13 [MPL'U]/mL — ABNORMAL HIGH (ref 0–12)

## 2024-01-17 LAB — PROTEIN C, TOTAL: Protein C, Total: 111 % (ref 60–150)

## 2024-01-22 LAB — PROTHROMBIN GENE MUTATION

## 2024-01-22 LAB — FACTOR 5 LEIDEN

## 2024-02-02 DIAGNOSIS — Z86718 Personal history of other venous thrombosis and embolism: Secondary | ICD-10-CM | POA: Insufficient documentation

## 2024-02-02 NOTE — Assessment & Plan Note (Signed)
 DVT in the left lower extremity diagnosed in January 2020, treated with Xarelto  for three months. 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 Will repeat these tests in 3 months

## 2024-02-03 ENCOUNTER — Other Ambulatory Visit: Payer: Self-pay | Admitting: Obstetrics and Gynecology

## 2024-02-03 ENCOUNTER — Inpatient Hospital Stay: Payer: Managed Care, Other (non HMO) | Attending: Hematology | Admitting: Hematology

## 2024-02-03 DIAGNOSIS — Z86718 Personal history of other venous thrombosis and embolism: Secondary | ICD-10-CM

## 2024-02-03 DIAGNOSIS — N809 Endometriosis, unspecified: Secondary | ICD-10-CM

## 2024-02-03 NOTE — Progress Notes (Signed)
 [facility]   Telephone:(336) [phone] Fax:(336) [phone]   Clinic Follow up Note   No care team member to display 02/03/2024  I connected with [employer] on 02/03/24 at  9:00 AM EST by telephone and verified that I am speaking with the correct person using two identifiers.   I discussed the limitations, risks, security and privacy concerns of performing an evaluation and management service by telephone and the availability of in person appointments. I also discussed with the patient that there may be a patient responsible charge related to this service. The patient expressed understanding and agreed to proceed.   Patient's location:  outside  Provider's location:  Office    CHIEF COMPLAINT: Follow-up for lab results   Oncology history History of DVT of lower extremity DVT in the left lower extremity diagnosed in January 2020, treated with Xarelto  for three months. 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 Will repeat these tests in 3 months  Assessment and Plan    Protein S Deficiency Protein S deficiency with a slightly low protein S level (42%).  No family history of thrombosis, this is not definitive for protein S deficiency, no liver disease or malnutrition. Explained increased risk for blood clots and potential need for lifelong anticoagulation if another episode occurs. - Repeat protein S level test in three months - Consider starting baby aspirin if protein S level remains low - No further action if protein S level improves - Follow-up call one week after repeat lab work  Anticardiolipin Antibody IgM Slightly elevated anticardiolipin IgM antibody level (13, normal 0-12). Not clinically significant but will be monitored. - Monitor anticardiolipin IgM  antibody level in future lab tests  General Health Maintenance Generally healthy, maintains a good diet, and avoids daily alcohol consumption. - Maintain a healthy diet - Avoid daily alcohol consumption - Consider baby aspirin if protein S level remains low in future tests  Plan -lab reviewed  - Schedule lab work in three months - Follow-up call one week after lab work.         SUMMARY OF ONCOLOGIC HISTORY: Oncology History   No history exists.    Discussed the use of AI scribe software for clinical note transcription with the patient, who gave verbal consent to proceed.  History of Present Illness   Miss Cowper, a 38 year old female with a history of DVT, had recent lab work done to investigate potential genetic mutations or clotting factor protein deficiencies that could be contributing to her history of blood clots. She reports no significant family history of blood clots, although her grandmother is on Xarelto  for a heart condition. The patient does not have any known liver disease. She is generally healthy and maintains a good diet.         REVIEW OF SYSTEMS:   Constitutional: Denies fevers, chills or abnormal weight loss Eyes: Denies blurriness of vision Ears, nose, mouth, throat, and face: Denies mucositis or sore throat Respiratory: Denies cough, dyspnea or wheezes Cardiovascular: Denies palpitation, chest discomfort or lower extremity swelling Gastrointestinal:  Denies nausea, heartburn or change in bowel habits Skin: Denies abnormal skin rashes Lymphatics: Denies new lymphadenopathy or easy bruising Neurological:Denies numbness, tingling or new weaknesses Behavioral/Psych: Mood is stable, no new changes  All other systems were reviewed with the patient and are negative.  MEDICAL HISTORY:  Past Medical History:  Diagnosis Date   Abnormal pap smears 06/16/2013   Asthma    DVT, lower extremity, proximal (HCC)    Endometriosis    GERD (gastroesophageal  reflux disease)     SURGICAL HISTORY: Past Surgical History:  Procedure Laterality Date   laproscopy  2011   LEEP N/A 12/2019    I have reviewed the social history and family history with the patient and they are unchanged from previous note.  ALLERGIES:  is allergic to diphenhydramine and latex.  MEDICATIONS:  Current Outpatient Medications  Medication Sig Dispense Refill   acetaminophen  (TYLENOL ) 500 MG tablet Take 2 tablets (1,000 mg total) by mouth every 6 (six) hours as needed. 120 tablet 1   famotidine (PEPCID) 20 MG tablet Take 20 mg by mouth daily.     ibuprofen  (ADVIL ) 800 MG tablet TAKE 1 TABLET BY MOUTH EVERY 8 HOURS AS NEEDED 45 tablet 1   linaclotide  (LINZESS ) 290 MCG CAPS capsule Take 1 capsule (290 mcg total) by mouth daily before breakfast. 30 capsule 12   Loratadine (CLARITIN PO) Take by mouth.     Magnesium Hydroxide (DULCOLAX PO) Take by mouth as needed.     metroNIDAZOLE  (METROGEL ) 0.75 % vaginal gel Place 1 Applicatorful vaginally at bedtime. Apply one applicatorful to vagina at bedtime for 10 days, then twice a week for 6 months. 70 g 5   polyethylene glycol (MIRALAX  / GLYCOLAX ) 17 g packet Take 17 g by mouth daily.     PROAIR  HFA 108 (90 Base) MCG/ACT inhaler INHALE 1-2 PUFFS INTO THE LUNGS EVERY 6 (SIX) HOURS AS NEEDED FOR WHEEZING OR SHORTNESS OF BREATH. 8.5 each 1   VITAMIN D3 1.25 MG (50000 UT) capsule Take 50,000 Units by mouth once a week.     No current facility-administered medications for this visit.    PHYSICAL EXAMINATION: Not performed   LABORATORY DATA:  I have reviewed the data as listed    Latest Ref Rng & Units 07/30/2023    2:59 PM 04/21/2020    5:37 AM 09/21/2019    8:37 PM  CBC  WBC 4.0 - 10.5 K/uL 5.2  5.5  4.6   Hemoglobin 12.0 - 15.0 g/dL 87.7  88.0  87.9   Hematocrit 36.0 - 46.0 % 37.2  36.0  36.9   Platelets 150.0 - 400.0 K/uL 252.0  298  252         Latest Ref Rng & Units 04/21/2020    5:37 AM 09/21/2019    8:37 PM  02/15/2019    4:11 PM  CMP  Glucose 70 - 99 mg/dL 893  891  87   BUN 6 - 20 mg/dL 7  7  8    Creatinine 0.44 - 1.00 mg/dL 9.46  9.44  9.24   Sodium 135 - 145 mmol/L 136  136  138   Potassium 3.5 - 5.1 mmol/L 3.9  3.8  4.3   Chloride 98 - 111 mmol/L 106  110  103   CO2 22 - 32 mmol/L 24  23  21    Calcium 8.9 - 10.3 mg/dL 8.8  8.4  9.5   Total Protein 6.5 - 8.1 g/dL 7.5   7.1   Total Bilirubin 0.3 - 1.2 mg/dL 0.5   0.6   Alkaline Phos 38 - 126 U/L 43   55   AST 15 - 41 U/L 16   8   ALT 0 - 44 U/L 15   13  RADIOGRAPHIC STUDIES: I have personally reviewed the radiological images as listed and agreed with the findings in the report. No results found.     I discussed the assessment and treatment plan with the patient. The patient was provided an opportunity to ask questions and all were answered. The patient agreed with the plan and demonstrated an understanding of the instructions.   The patient was advised to call back or seek an in-person evaluation if the symptoms worsen or if the condition fails to improve as anticipated.  I provided 12 minutes of non face-to-face telephone visit time during this encounter, and > 50% was spent counseling as documented under my assessment & plan.     Onita Mattock, MD 02/03/24

## 2024-02-04 ENCOUNTER — Telehealth: Payer: Self-pay | Admitting: Hematology

## 2024-02-04 NOTE — Telephone Encounter (Signed)
 Scheduled appointments per 2/5 los. Left VM with appointment details.

## 2024-02-05 ENCOUNTER — Telehealth: Payer: Self-pay

## 2024-02-05 NOTE — Telephone Encounter (Signed)
-----   Message from Nurse Brittany S sent at 02/03/2024  4:04 PM EST ----- Regarding: FW: MRI  ----- Message ----- From: Jordan, Vanessa G Sent: 02/03/2024   3:04 PM EST To: [facility] Clinical Pool Subject: FW: MRI                                         ----- Message ----- From: Jeralyn Crutch, MD Sent: 02/03/2024   1:08 PM EST To: [facility] Admin Pool Subject: MRI                                            Please help schedule MRI.  Thank you,  Ajewole

## 2024-02-05 NOTE — Telephone Encounter (Signed)
 Called pt to get preferred day and time for MRI. Patient stated she was flexible and wanted the soonest appointment. Told patient I would call and get appointment schedule and that the appointment would pop up in her MyChart. Patient verified understanding. MRI scheduled for 02/11/2024 @ 3:00pm.  B'Aisha, CMA

## 2024-02-11 ENCOUNTER — Ambulatory Visit (HOSPITAL_COMMUNITY): Admission: RE | Admit: 2024-02-11 | Payer: Managed Care, Other (non HMO) | Source: Ambulatory Visit

## 2024-05-09 ENCOUNTER — Other Ambulatory Visit: Payer: Self-pay

## 2024-05-09 DIAGNOSIS — Z86718 Personal history of other venous thrombosis and embolism: Secondary | ICD-10-CM

## 2024-05-10 ENCOUNTER — Inpatient Hospital Stay: Payer: BC Managed Care – PPO | Attending: Hematology

## 2024-05-17 ENCOUNTER — Ambulatory Visit: Payer: Self-pay | Admitting: Hematology

## 2024-05-17 NOTE — Assessment & Plan Note (Deleted)
 DVT in the left lower extremity diagnosed in January 2020, treated with Xarelto  for three months. 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 Will repeat these tests in 3 months

## 2024-05-18 ENCOUNTER — Telehealth: Payer: Self-pay

## 2024-05-18 ENCOUNTER — Telehealth: Payer: Self-pay | Admitting: Hematology

## 2024-05-18 NOTE — Telephone Encounter (Signed)
 Patient did not arrive for her office visit with Dr. Maryalice Smaller yesterday or her Lab appointment 5/13.

## 2024-09-07 ENCOUNTER — Other Ambulatory Visit (HOSPITAL_COMMUNITY)
Admission: RE | Admit: 2024-09-07 | Discharge: 2024-09-07 | Disposition: A | Discharge status: Home / Self Care | Source: Ambulatory Visit | Attending: Obstetrics and Gynecology | Admitting: Obstetrics and Gynecology

## 2024-09-07 ENCOUNTER — Other Ambulatory Visit: Payer: Self-pay

## 2024-09-07 ENCOUNTER — Ambulatory Visit (INDEPENDENT_AMBULATORY_CARE_PROVIDER_SITE_OTHER): Admitting: Obstetrics and Gynecology

## 2024-09-07 VITALS — BP 116/73 | HR 66 | Wt 166.5 lb

## 2024-09-07 DIAGNOSIS — N898 Other specified noninflammatory disorders of vagina: Secondary | ICD-10-CM | POA: Diagnosis present

## 2024-09-07 DIAGNOSIS — Z1331 Encounter for screening for depression: Secondary | ICD-10-CM | POA: Diagnosis not present

## 2024-09-07 DIAGNOSIS — Z0000 Encounter for general adult medical examination without abnormal findings: Secondary | ICD-10-CM

## 2024-09-07 NOTE — Progress Notes (Signed)
 GYNECOLOGY VISIT  Patient name: Lori Mays MRN [id number]  Date of birth: [birth year] Chief Complaint:   Gynecologic Exam   History:  Discussed the use of AI scribe software for clinical note transcription with the patient, who gave verbal consent to proceed.  History of Present Illness Lori Mays is a 38 year old female who presents with spotting and irregular bowel habits.  She experiences spotting without associated pain. During her last menstrual cycle two weeks ago, she had cramping, but currently, there is no pain. The spotting has persisted since her last period and is described as slightly more than before. It is sometimes accompanied by vaginal dryness, referred to as 'dry spotting'. The spotting varies in color and consistency, making tracking difficult. She is sexually active with the same partner and reports no noticeable bleeding with intercourse. Occasional vaginal dryness occurs 'at random'.  She has irregular bowel habits, describing them as infrequent bowel movements followed by episodes of diarrhea. She was previously on Linzess , prescribed by a gastroenterologist two years ago, but has not been using it recently as she has not returned to the GI doctor.  She denies hot flashes and states she is 'more always cold'. She mentions a history of blood clots and is supposed to have her legs checked for edema. Usual breast soreness and itching are present, but there is no nipple discharge. Her last Pap smear was in 2023 and was normal. She is not currently using any form of menstrual suppression.     The following portions of the patient's history were reviewed and updated as appropriate: allergies, current medications, past family history, past medical history, past social history, past surgical history and problem list.   Health Maintenance:   Last pap     Component Value Date/Time   DIAGPAP  07/21/2022 1502    - Negative for intraepithelial lesion or  malignancy (NILM)   DIAGPAP  04/01/2021 0917    - Negative for intraepithelial lesion or malignancy (NILM)   DIAGPAP - Low grade squamous intraepithelial lesion (LSIL) (A) 10/24/2019 1324   HPVHIGH Negative 07/21/2022 1502   HPVHIGH Negative 04/01/2021 0917   ADEQPAP  07/21/2022 1502    Satisfactory for evaluation; transformation zone component PRESENT.   ADEQPAP  04/01/2021 0917    Satisfactory for evaluation; transformation zone component PRESENT.   ADEQPAP  10/24/2019 1324    Satisfactory for evaluation; transformation zone component PRESENT.    Health Maintenance  Topic Date Due   Hepatitis C Screening  Never done   DTaP/Tdap/Td vaccine (1 - Tdap) Never done   Hepatitis B Vaccine (1 of 3 - 19+ 3-dose series) Never done   Pneumococcal Vaccine (2 of 2 - PCV) 02/10/2016   Flu Shot  Never done   Pap with HPV screening  07/22/2027   HPV Vaccine  Completed   HIV Screening  Completed   Meningitis B Vaccine  Aged Out   COVID-19 Vaccine  Discontinued      Review of Systems:  Pertinent items are noted in HPI. Comprehensive review of systems was otherwise negative.   Objective:  Physical Exam BP 116/73   Pulse 66   Wt 166 lb 8 oz (75.5 kg)   LMP 08/22/2024 (Approximate)   BMI 24.59 kg/m    Physical Exam   Labs and Imaging No results found.     Assessment & Plan:   Assessment & Plan Abnormal vaginal bleeding (spotting) Intermittent spotting without pain. Recent normal Pap smear. Differential includes cervical  irritation or uterine lining issues. - Await swab results for bacterial vaginosis. - If swab negative, perform cervical examination. - Consider ultrasound if cervical exam is unremarkable and spotting persists.  Possible recurrent bacterial vaginosis Uncertain resolution of bacterial vaginosis. Previous oral treatment partially effective. Symptoms include spotting and vaginal dryness. Prefers oral medication if confirmed. - Await swab results for bacterial  vaginosis. - If positive, initiate oral medication.  Vaginal dryness Intermittent vaginal dryness, sometimes with spotting. No clear correlation with other symptoms.    Carter Quarry, MD [facility], [facility]

## 2024-09-09 LAB — CERVICOVAGINAL ANCILLARY ONLY
Bacterial Vaginitis (gardnerella): NEGATIVE
Candida Glabrata: NEGATIVE
Candida Vaginitis: NEGATIVE
Comment: NEGATIVE
Comment: NEGATIVE
Comment: NEGATIVE

## 2024-09-12 ENCOUNTER — Ambulatory Visit: Payer: Self-pay | Admitting: Obstetrics and Gynecology

## 2024-09-12 DIAGNOSIS — N920 Excessive and frequent menstruation with regular cycle: Secondary | ICD-10-CM
